# Patient Record
Sex: Male | Born: 1935 | Race: White | Hispanic: No | Marital: Married | Smoking: Former smoker
Health system: Southern US, Community
[De-identification: ages and names within clinical notes are randomized; demographics above are authoritative.]

## PROBLEM LIST (undated history)

## (undated) DIAGNOSIS — J449 Chronic obstructive pulmonary disease, unspecified: Secondary | ICD-10-CM

## (undated) DIAGNOSIS — I10 Essential (primary) hypertension: Secondary | ICD-10-CM

## (undated) DIAGNOSIS — I2510 Atherosclerotic heart disease of native coronary artery without angina pectoris: Secondary | ICD-10-CM

## (undated) DIAGNOSIS — I509 Heart failure, unspecified: Secondary | ICD-10-CM

## (undated) HISTORY — PX: CORONARY ARTERY BYPASS GRAFT: SHX141

---

## 2006-06-22 ENCOUNTER — Emergency Department: Payer: Self-pay | Admitting: Emergency Medicine

## 2007-02-11 ENCOUNTER — Inpatient Hospital Stay: Payer: Self-pay | Admitting: Unknown Physician Specialty

## 2007-02-11 ENCOUNTER — Other Ambulatory Visit: Payer: Self-pay

## 2007-03-16 ENCOUNTER — Ambulatory Visit: Payer: Self-pay | Admitting: Unknown Physician Specialty

## 2008-02-29 ENCOUNTER — Encounter: Payer: Self-pay | Admitting: Internal Medicine

## 2008-03-26 ENCOUNTER — Encounter: Payer: Self-pay | Admitting: Internal Medicine

## 2008-04-25 ENCOUNTER — Encounter: Payer: Self-pay | Admitting: Internal Medicine

## 2008-05-26 ENCOUNTER — Encounter: Payer: Self-pay | Admitting: Internal Medicine

## 2011-11-03 DIAGNOSIS — K279 Peptic ulcer, site unspecified, unspecified as acute or chronic, without hemorrhage or perforation: Secondary | ICD-10-CM | POA: Insufficient documentation

## 2011-11-03 DIAGNOSIS — Z87891 Personal history of nicotine dependence: Secondary | ICD-10-CM | POA: Insufficient documentation

## 2011-11-03 DIAGNOSIS — I2510 Atherosclerotic heart disease of native coronary artery without angina pectoris: Secondary | ICD-10-CM | POA: Insufficient documentation

## 2011-11-30 ENCOUNTER — Ambulatory Visit: Payer: Self-pay | Admitting: Urology

## 2011-12-23 ENCOUNTER — Ambulatory Visit: Payer: Self-pay | Admitting: Urology

## 2011-12-23 DIAGNOSIS — I2510 Atherosclerotic heart disease of native coronary artery without angina pectoris: Secondary | ICD-10-CM

## 2011-12-23 LAB — CBC WITH DIFFERENTIAL/PLATELET
Basophil #: 0 10*3/uL (ref 0.0–0.1)
Basophil %: 0.1 %
Eosinophil #: 0.1 10*3/uL (ref 0.0–0.7)
HGB: 15.3 g/dL (ref 13.0–18.0)
Lymphocyte %: 13.9 %
MCH: 30.9 pg (ref 26.0–34.0)
MCHC: 33.9 g/dL (ref 32.0–36.0)
Monocyte #: 0.9 10*3/uL — ABNORMAL HIGH (ref 0.0–0.7)
Monocyte %: 10.7 %
Neutrophil #: 6.5 10*3/uL (ref 1.4–6.5)
Neutrophil %: 73.6 %
RDW: 13 % (ref 11.5–14.5)

## 2011-12-23 LAB — BASIC METABOLIC PANEL
Anion Gap: 5 — ABNORMAL LOW (ref 7–16)
BUN: 19 mg/dL — ABNORMAL HIGH (ref 7–18)
EGFR (African American): >60
EGFR (Non-African Amer.): >60
Glucose: 115 mg/dL — ABNORMAL HIGH (ref 65–99)
Osmolality: 281 (ref 275–301)
Potassium: 4.1 mmol/L (ref 3.5–5.1)

## 2011-12-24 LAB — URINE CULTURE

## 2011-12-30 ENCOUNTER — Ambulatory Visit: Payer: Self-pay | Admitting: Urology

## 2012-01-01 LAB — PATHOLOGY REPORT

## 2012-07-08 DIAGNOSIS — R300 Dysuria: Secondary | ICD-10-CM | POA: Insufficient documentation

## 2012-08-08 DIAGNOSIS — N400 Enlarged prostate without lower urinary tract symptoms: Secondary | ICD-10-CM | POA: Insufficient documentation

## 2012-12-19 ENCOUNTER — Ambulatory Visit: Payer: Self-pay | Admitting: Unknown Physician Specialty

## 2014-02-27 ENCOUNTER — Emergency Department: Payer: Self-pay | Admitting: Emergency Medicine

## 2014-02-27 DIAGNOSIS — G4700 Insomnia, unspecified: Secondary | ICD-10-CM | POA: Insufficient documentation

## 2014-02-27 DIAGNOSIS — K219 Gastro-esophageal reflux disease without esophagitis: Secondary | ICD-10-CM | POA: Insufficient documentation

## 2014-02-27 DIAGNOSIS — Z951 Presence of aortocoronary bypass graft: Secondary | ICD-10-CM | POA: Insufficient documentation

## 2014-02-27 DIAGNOSIS — J45909 Unspecified asthma, uncomplicated: Secondary | ICD-10-CM | POA: Insufficient documentation

## 2014-02-27 DIAGNOSIS — K2270 Barrett's esophagus without dysplasia: Secondary | ICD-10-CM | POA: Insufficient documentation

## 2014-02-27 DIAGNOSIS — S32409A Unspecified fracture of unspecified acetabulum, initial encounter for closed fracture: Secondary | ICD-10-CM | POA: Insufficient documentation

## 2014-02-27 DIAGNOSIS — G2581 Restless legs syndrome: Secondary | ICD-10-CM | POA: Insufficient documentation

## 2014-02-27 LAB — CBC
HCT: 41.6 % (ref 40.0–52.0)
HGB: 14.1 g/dL (ref 13.0–18.0)
MCH: 30.3 pg (ref 26.0–34.0)
MCHC: 33.9 g/dL (ref 32.0–36.0)
MCV: 89 fL (ref 80–100)
Platelet: 160 10*3/uL (ref 150–440)
RBC: 4.65 10*6/uL (ref 4.40–5.90)
RDW: 13.2 % (ref 11.5–14.5)
WBC: 14.2 10*3/uL — ABNORMAL HIGH (ref 3.8–10.6)

## 2014-02-27 LAB — COMPREHENSIVE METABOLIC PANEL
ALBUMIN: 4 g/dL (ref 3.4–5.0)
ALK PHOS: 68 U/L
AST: 28 U/L (ref 15–37)
Anion Gap: 6 — ABNORMAL LOW (ref 7–16)
BILIRUBIN TOTAL: 0.5 mg/dL (ref 0.2–1.0)
BUN: 23 mg/dL — ABNORMAL HIGH (ref 7–18)
CHLORIDE: 108 mmol/L — AB (ref 98–107)
Calcium, Total: 8.9 mg/dL (ref 8.5–10.1)
Co2: 25 mmol/L (ref 21–32)
Creatinine: 1.15 mg/dL (ref 0.60–1.30)
EGFR (African American): >60
EGFR (Non-African Amer.): >60
Glucose: 142 mg/dL — ABNORMAL HIGH (ref 65–99)
Osmolality: 284 (ref 275–301)
Potassium: 4.1 mmol/L (ref 3.5–5.1)
SGPT (ALT): 24 U/L (ref 12–78)
Sodium: 139 mmol/L (ref 136–145)
Total Protein: 7 g/dL (ref 6.4–8.2)

## 2014-02-27 LAB — APTT: ACTIVATED PTT: 31.1 s (ref 23.6–35.9)

## 2014-02-27 LAB — PROTIME-INR
INR: 1.2
Prothrombin Time: 14.6 secs (ref 11.5–14.7)

## 2014-03-02 ENCOUNTER — Encounter: Payer: Self-pay | Admitting: Internal Medicine

## 2014-03-26 ENCOUNTER — Encounter: Payer: Self-pay | Admitting: Internal Medicine

## 2015-02-17 NOTE — Op Note (Signed)
PATIENT NAME:  Jerry Jimenez, CHEUVRONT MR#:  [id number] DATE OF BIRTH:  24-Feb-1934  DATE OF PROCEDURE:  12/30/2011  PREOPERATIVE DIAGNOSES:  1. Microhematuria.  2. Bladder lesion.   POSTOPERATIVE DIAGNOSIS:  1. Microhematuria. 2. Bladder lesion.   PROCEDURES:  1. Transurethral resection of bladder tumor.  2. Bladder biopsies.   SURGEON: John Giovanni, M.D.   ASSISTANT: None.   ANESTHESIA: General.   INDICATIONS: 81 year old male with a history of irritative voiding symptoms and microhematuria. 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 Cystoscopy demonstrated bullous edema and erythema within the diverticulum mucosa. There was abnormal-appearing tissue surrounding the mouth of the diverticulum. He presents for resection/biopsy.   DESCRIPTION OF PROCEDURE: The patient was taken to the operating room where a general anesthetic was administered. He was placed in the low lithotomy position and his external genitalia were prepped and draped sterilely. A 21 French cystoscope with 30-degrees lens was lubricated and passed under direct vision. The urethra was normal in caliber without stricture. There was moderate lateral lobe enlargement and a large median lobe present. Bladder mucosa was closely inspected and there was 3+ trabeculation with cellule formation. On the right posterior lateral wall was a wide-mouth diverticulum which was easily scoped and shows areas of bullous edema. Mucosa surrounding the mouth of the diverticulum was slightly hemorrhagic with whitish-appearing areas. The bullous edema areas just inside the diverticulum were biopsied with cold cup forceps. No perforation was noted. The site was fulgurated with a Bugbee electrode. The cystoscope was removed and a continuous flow resectoscope sheath with visual obturator was lubricated and passed per urethra. The Iglesias resectoscope was then placed in the sheath and using a saline  generator, the hemorrhagic areas were resected to superficial muscle. Sites were fulgurated. No significant bleeding was noted. All specimen was removed via irrigation. The resectoscope was removed. A 16 French Foley catheter was placed with return of clear effluent upon irrigation. The patient was taken to the PAC-U in stable condition. There were no complications. Estimated blood loss was minimal.      ____________________________ Ronda Fairly. Bernardo Heater, MD scs:bjt D: 12/30/2011 13:26:46 ET T: 12/30/2011 13:47:47 ET JOB#: [id number]  cc: Nicki Reaper C. Bernardo Heater, MD, <Dictator> Abbie Sons MD ELECTRONICALLY SIGNED 01/04/2012 18:16

## 2015-06-05 ENCOUNTER — Ambulatory Visit: Payer: Medicare Other | Attending: Specialist

## 2015-06-05 DIAGNOSIS — G4761 Periodic limb movement disorder: Secondary | ICD-10-CM | POA: Diagnosis not present

## 2015-06-05 DIAGNOSIS — R0683 Snoring: Secondary | ICD-10-CM | POA: Insufficient documentation

## 2015-06-05 DIAGNOSIS — I10 Essential (primary) hypertension: Secondary | ICD-10-CM | POA: Insufficient documentation

## 2015-06-05 DIAGNOSIS — E669 Obesity, unspecified: Secondary | ICD-10-CM | POA: Insufficient documentation

## 2015-06-05 DIAGNOSIS — G4733 Obstructive sleep apnea (adult) (pediatric): Secondary | ICD-10-CM | POA: Insufficient documentation

## 2016-08-06 DIAGNOSIS — J449 Chronic obstructive pulmonary disease, unspecified: Secondary | ICD-10-CM | POA: Insufficient documentation

## 2016-08-06 DIAGNOSIS — E782 Mixed hyperlipidemia: Secondary | ICD-10-CM | POA: Insufficient documentation

## 2016-08-06 DIAGNOSIS — E119 Type 2 diabetes mellitus without complications: Secondary | ICD-10-CM | POA: Insufficient documentation

## 2016-08-06 DIAGNOSIS — I10 Essential (primary) hypertension: Secondary | ICD-10-CM | POA: Insufficient documentation

## 2016-11-13 DIAGNOSIS — E538 Deficiency of other specified B group vitamins: Secondary | ICD-10-CM | POA: Insufficient documentation

## 2016-11-30 DIAGNOSIS — I482 Chronic atrial fibrillation, unspecified: Secondary | ICD-10-CM | POA: Insufficient documentation

## 2016-11-30 DIAGNOSIS — I350 Nonrheumatic aortic (valve) stenosis: Secondary | ICD-10-CM | POA: Insufficient documentation

## 2017-01-23 ENCOUNTER — Emergency Department: Payer: Medicare Other

## 2017-01-23 ENCOUNTER — Emergency Department
Admission: EM | Admit: 2017-01-23 | Discharge: 2017-01-23 | Disposition: A | Discharge status: Home / Self Care | Payer: Medicare Other | Attending: Emergency Medicine | Admitting: Emergency Medicine

## 2017-01-23 DIAGNOSIS — W208XXA Other cause of strike by thrown, projected or falling object, initial encounter: Secondary | ICD-10-CM | POA: Diagnosis not present

## 2017-01-23 DIAGNOSIS — Y999 Unspecified external cause status: Secondary | ICD-10-CM | POA: Insufficient documentation

## 2017-01-23 DIAGNOSIS — Y929 Unspecified place or not applicable: Secondary | ICD-10-CM | POA: Insufficient documentation

## 2017-01-23 DIAGNOSIS — Z23 Encounter for immunization: Secondary | ICD-10-CM | POA: Diagnosis not present

## 2017-01-23 DIAGNOSIS — S60413A Abrasion of left middle finger, initial encounter: Secondary | ICD-10-CM | POA: Diagnosis present

## 2017-01-23 DIAGNOSIS — S60419A Abrasion of unspecified finger, initial encounter: Secondary | ICD-10-CM

## 2017-01-23 DIAGNOSIS — Y939 Activity, unspecified: Secondary | ICD-10-CM | POA: Diagnosis not present

## 2017-01-23 MED ORDER — LIDOCAINE-EPINEPHRINE-TETRACAINE (LET) SOLUTION
NASAL | Status: AC
  Filled 2017-01-23: qty 3

## 2017-01-23 MED ORDER — TETANUS-DIPHTH-ACELL PERTUSSIS 5-2.5-18.5 LF-MCG/0.5 IM SUSP
0.5000 mL | Freq: Once | INTRAMUSCULAR | Status: AC
  Administered 2017-01-23: 0.5 mL via INTRAMUSCULAR
  Filled 2017-01-23: qty 0.5

## 2017-01-23 MED ORDER — CEPHALEXIN 500 MG PO CAPS: 500.0000 mg | ORAL_CAPSULE | Freq: Four times a day (QID) | ORAL | 0 refills | Status: AC

## 2017-01-23 NOTE — ED Provider Notes (Signed)
[facility] Emergency Department Provider Note  ____________________________________________  Time seen: Approximately 5:06 PM  I have reviewed the triage vital signs and the nursing notes.   HISTORY  Chief Complaint Laceration    HPI Jerry Jimenez is a 83 y.o. male that presents to the emergency department with left middle finger abrasion that continues to bleed. He states that a "utility door" dropped on his finger about 4 hours ago. He states that he thought he could stop the bleeding but it has continued to bleed. Patient denies any additional injuries. He does not remember last tetanus shot. Patient is on blood thinners. No shortness of breath, chest pain, nausea, vomiting, abdominal pain, numbness, tingling.   No past medical history on file.  There are no active problems to display for this patient.   No past surgical history on file.  Prior to Admission medications   Medication Sig Start Date End Date Taking? Authorizing Provider  cephALEXin (KEFLEX) 500 MG capsule Take 1 capsule (500 mg total) by mouth 4 (four) times daily. 01/23/17 02/02/17  Laban Emperor, PA-C    Allergies Patient has no allergy information on record.  No family history on file.  Social History Social History  Substance Use Topics  . Smoking status: Not on file  . Smokeless tobacco: Not on file  . Alcohol use Not on file     Review of Systems  Constitutional: No fever/chills Cardiovascular: No chest pain. Respiratory: No SOB. Gastrointestinal: No abdominal pain.  No nausea, no vomiting.  Musculoskeletal: Positive for finger pain. Skin: Negative for rash, ecchymosis. Positive for laceration. Neurological: Negative for headaches, numbness or tingling   ____________________________________________   PHYSICAL EXAM:  VITAL SIGNS: ED Triage Vitals [01/23/17 1659]  Enc Vitals Group     BP (!) 170/102     Pulse Rate 78     Resp 18     Temp 98.2 F (36.8 C)      Temp Source Oral     SpO2 94 %     Weight 272 lb (123.4 kg)     Height '5\' 11"'$  (1.803 m)     Head Circumference      Peak Flow      Pain Score      Pain Loc      Pain Edu?      Excl. in [place]?      Constitutional: Alert and oriented. Well appearing and in no acute distress. Eyes: Conjunctivae are normal. PERRL. EOMI. Head: Atraumatic. ENT:      Ears:      Nose: No congestion/rhinnorhea.      Mouth/Throat: Mucous membranes are moist.  Neck: No stridor. Cardiovascular: Normal rate, regular rhythm.  Good peripheral circulation. Respiratory: Normal respiratory effort without tachypnea or retractions. Lungs CTAB. Good air entry to the bases with no decreased or absent breath sounds. Musculoskeletal: Full range of motion to all extremities. No gross deformities appreciated. Neurologic:  Normal speech and language. No gross focal neurologic deficits are appreciated.  Skin:  Skin is warm, dry and intact. 1 cm S-shaped shallow but wide abrasion to right middle finger.   ____________________________________________   LABS (all labs ordered are listed, but only abnormal results are displayed)  Labs Reviewed - No data to display ____________________________________________  EKG   ____________________________________________  RADIOLOGY  Dg Finger Middle Left  Result Date: 01/23/2017 CLINICAL DATA:  Fall. EXAM: LEFT MIDDLE FINGER 2+V COMPARISON:  06/22/2016 FINDINGS: There is no evidence of fracture or dislocation. There  is no evidence of arthropathy or other focal bone abnormality. Soft tissues are unremarkable. IMPRESSION: Negative. Electronically Signed   By: Kerby Moors M.D.   On: 01/23/2017 17:42    ____________________________________________    PROCEDURES  Procedure(s) performed:    Procedures    Medications  lidocaine-EPINEPHrine-tetracaine (LET) solution (not administered)  Tdap (BOOSTRIX) injection 0.5 mL (0.5 mLs Intramuscular Given 01/23/17 1709)      ____________________________________________   INITIAL IMPRESSION / ASSESSMENT AND PLAN / ED COURSE  Pertinent labs & imaging results that were available during my care of the patient were reviewed by me and considered in my medical decision making (see chart for details).  Review of the [place] CSRS was performed in accordance of the [place] prior to dispensing any controlled drugs.     Patient's diagnosis is consistent with finger abrasion. Vital signs and exam are reassuring. X-ray negative for acute bony abnormalities. Abrasion is shallow but wide. There is no area to suture. Abrasion is too wide for Dermabond. LET was applied to abrasion to control the bleeding and abrasion stopped bleeding. Pressure bandage was applied and splint was placed to prevent further injury. Tetanus shot was updated. Patient will be discharged home with prescriptions for Keflex. Patient is to follow up with PCP as directed. Patient is given ED precautions to return to the ED for any worsening or new symptoms.     ____________________________________________  FINAL CLINICAL IMPRESSION(S) / ED DIAGNOSES  Final diagnoses:  Abrasion of finger, initial encounter      NEW MEDICATIONS STARTED DURING THIS VISIT:  Discharge Medication List as of 01/23/2017  6:12 PM    START taking these medications   Details  cephALEXin (KEFLEX) 500 MG capsule Take 1 capsule (500 mg total) by mouth 4 (four) times daily., Starting Sat 01/23/2017, Until Tue 02/02/2017, Print            This chart was dictated using voice recognition software/Dragon. Despite best efforts to proofread, errors can occur which can change the meaning. Any change was purely unintentional.    Laban Emperor, PA-C 01/23/17 [place], PA-C 01/23/17 [place], PA-C 01/23/17 [place] Mal[place], MD 01/24/17 [phone]

## 2017-01-23 NOTE — ED Triage Notes (Signed)
Patient had a "drop-down door" fall onto his finger lacerating his left third

## 2017-07-05 DIAGNOSIS — E669 Obesity, unspecified: Secondary | ICD-10-CM | POA: Insufficient documentation

## 2017-07-20 ENCOUNTER — Encounter: Payer: Medicare Other | Attending: Pulmonary Disease

## 2017-07-20 VITALS — Ht 70.0 in | Wt 221.4 lb

## 2017-07-20 DIAGNOSIS — Z87891 Personal history of nicotine dependence: Secondary | ICD-10-CM | POA: Diagnosis not present

## 2017-07-20 DIAGNOSIS — J449 Chronic obstructive pulmonary disease, unspecified: Secondary | ICD-10-CM | POA: Insufficient documentation

## 2017-07-20 NOTE — Progress Notes (Signed)
Pulmonary Individual Treatment Plan  Patient Details  Name: Jerry Jimenez MRN: [id number] Date of Birth: [birth year] Referring Provider:     Pulmonary Rehab from 07/20/2017 in [facility] Cardiac and Pulmonary Rehab  Referring Provider  Pugh      Initial Encounter Date:    Pulmonary Rehab from 07/20/2017 in [facility] Cardiac and Pulmonary Rehab  Date  07/20/17  Referring Provider  Pugh      Visit Diagnosis: Chronic obstructive pulmonary disease, unspecified COPD type ([place])  Patient's Home Medications on Admission: No current outpatient prescriptions on file.  Past Medical History: No past medical history on file.  Tobacco Use: History  Smoking Status  . Former Smoker  . Packs/day: 3.50  . Years: 35.00  . Types: Cigarettes  . Quit date: 07/06/1987  Smokeless Tobacco  . Former Systems developer  . Types: Chew  . Quit date: 07/05/2002    Labs: Recent Review Flowsheet Data    There is no flowsheet data to display.       Pulmonary Assessment Scores:     Pulmonary Assessment Scores    Row Name 07/20/17 1130         ADL UCSD   ADL Phase Entry     SOB Score total 58     Rest 3     Walk 3     Stairs 3     Bath 1     Dress 2     Shop 3       CAT Score   CAT Score 21        Pulmonary Function Assessment:     Pulmonary Function Assessment - 07/20/17 1151      Initial Spirometry Results   FVC% 88 %   FEV1% 85 %   FEV1/FVC Ratio 65.72     Post Bronchodilator Spirometry Results   FVC% 87.9 %   FEV1% 83.3 %   FEV1/FVC Ratio 67.71     Breath   Bilateral Breath Sounds Clear   Shortness of Breath Yes;Limiting activity      Exercise Target Goals: Date: 07/20/17  Exercise Program Goal: Individual exercise prescription set with THRR, safety & activity barriers. Participant demonstrates ability to understand and report RPE using BORG scale, to self-measure pulse accurately, and to acknowledge the importance of the exercise prescription.  Exercise Prescription  Goal: Starting with aerobic activity 30 plus minutes a day, 3 days per week for initial exercise prescription. Provide home exercise prescription and guidelines that participant acknowledges understanding prior to discharge.  Activity Barriers & Risk Stratification:   6 Minute Walk:     6 Minute Walk    Row Name 07/20/17 1237         6 Minute Walk   Distance 1280 feet     Walk Time 6 minutes     # of Rest Breaks 0     MPH 2.42     METS 2.48     RPE 12     Perceived Dyspnea  2     VO2 Peak 8.7     Symptoms No     Resting HR 75 bpm     Resting BP 146/80     Resting Oxygen Saturation  96 %     Exercise Oxygen Saturation  during 6 min walk 88 %     Max Ex. HR 111 bpm     Max Ex. BP 172/74     2 Minute Post BP 160/72       Interval HR  1 Minute HR 75     2 Minute HR 100     3 Minute HR 98     4 Minute HR 110     5 Minute HR 110     6 Minute HR 111     Interval Heart Rate? Yes       Interval Oxygen   Interval Oxygen? Yes     Baseline Oxygen Saturation % 96 %     1 Minute Oxygen Saturation % 93 %     1 Minute Liters of Oxygen 0 L     2 Minute Oxygen Saturation % 95 %     2 Minute Liters of Oxygen 0 L     3 Minute Oxygen Saturation % 94 %     3 Minute Liters of Oxygen 0 L     4 Minute Oxygen Saturation % 91 %     4 Minute Liters of Oxygen 0 L     5 Minute Oxygen Saturation % 91 %     5 Minute Liters of Oxygen 0 L     6 Minute Oxygen Saturation % 88 %     6 Minute Liters of Oxygen 0 L     2 Minute Post Oxygen Saturation % 95 %     2 Minute Post Liters of Oxygen 0 L       Oxygen Initial Assessment:     Oxygen Initial Assessment - 07/20/17 1140      Home Oxygen   Home Oxygen Device None   Sleep Oxygen Prescription None   Home Exercise Oxygen Prescription None   Home at Rest Exercise Oxygen Prescription None     Initial 6 min Walk   Oxygen Used None     Program Oxygen Prescription   Program Oxygen Prescription None     Intervention   Short Term Goals  To learn and understand importance of maintaining oxygen saturations>88%;To learn and demonstrate proper use of respiratory medications;To learn and demonstrate proper pursed lip breathing techniques or other breathing techniques.;To learn and understand importance of monitoring SPO2 with pulse oximeter and demonstrate accurate use of the pulse oximeter.   Long  Term Goals Verbalizes importance of monitoring SPO2 with pulse oximeter and return demonstration;Exhibits proper breathing techniques, such as pursed lip breathing or other method taught during program session;Demonstrates proper use of MDI's;Compliance with respiratory medication;Maintenance of O2 saturations>88%      Oxygen Re-Evaluation:   Oxygen Discharge (Final Oxygen Re-Evaluation):   Initial Exercise Prescription:     Initial Exercise Prescription - 07/20/17 1200      Date of Initial Exercise RX and Referring Provider   Date 07/20/17   Referring Provider Pugh     Treadmill   MPH 2   Grade 0   Minutes 15   METs 2.53     Recumbant Bike   Level 2   RPM 60   Watts 15   Minutes 15     NuStep   Level 2   SPM 80   Minutes 15   METs 2.4     Biostep-RELP   Level 2   SPM 50   Minutes 15   METs 2.4     Prescription Details   Frequency (times per week) 3   Duration Progress to 45 minutes of aerobic exercise without signs/symptoms of physical distress     Intensity   THRR 40-80% of Max Heartrate 100-126   Ratings of Perceived Exertion 11-15   Perceived Dyspnea 0-4  Resistance Training   Training Prescription Yes   Weight 3 lb   Reps 10-15      Perform Capillary Blood Glucose checks as needed.  Exercise Prescription Changes:   Exercise Comments:   Exercise Goals and Review:      Exercise Goals    Row Name 07/20/17 1237             Exercise Goals   Increase Physical Activity Yes       Intervention Provide advice, education, support and counseling about physical activity/exercise  needs.;Develop an individualized exercise prescription for aerobic and resistive training based on initial evaluation findings, risk stratification, comorbidities and participant's personal goals.       Expected Outcomes Achievement of increased cardiorespiratory fitness and enhanced flexibility, muscular endurance and strength shown through measurements of functional capacity and personal statement of participant.       Increase Strength and Stamina Yes       Intervention Provide advice, education, support and counseling about physical activity/exercise needs.;Develop an individualized exercise prescription for aerobic and resistive training based on initial evaluation findings, risk stratification, comorbidities and participant's personal goals.       Expected Outcomes Achievement of increased cardiorespiratory fitness and enhanced flexibility, muscular endurance and strength shown through measurements of functional capacity and personal statement of participant.       Able to understand and use rate of perceived exertion (RPE) scale Yes       Intervention Provide education and explanation on how to use RPE scale       Expected Outcomes Short Term: Able to use RPE daily in rehab to express subjective intensity level;Long Term:  Able to use RPE to guide intensity level when exercising independently       Able to understand and use Dyspnea scale Yes       Intervention Provide education and explanation on how to use Dyspnea scale       Expected Outcomes Short Term: Able to use Dyspnea scale daily in rehab to express subjective sense of shortness of breath during exertion;Long Term: Able to use Dyspnea scale to guide intensity level when exercising independently       Knowledge and understanding of Target Heart Rate Range (THRR) Yes       Intervention Provide education and explanation of THRR including how the numbers were predicted and where they are located for reference       Expected Outcomes Short  Term: Able to state/look up THRR;Long Term: Able to use THRR to govern intensity when exercising independently;Short Term: Able to use daily as guideline for intensity in rehab       Able to check pulse independently Yes       Intervention Provide education and demonstration on how to check pulse in carotid and radial arteries.;Review the importance of being able to check your own pulse for safety during independent exercise       Expected Outcomes Short Term: Able to explain why pulse checking is important during independent exercise;Long Term: Able to check pulse independently and accurately       Understanding of Exercise Prescription Yes       Intervention Provide education, explanation, and written materials on patient's individual exercise prescription       Expected Outcomes Short Term: Able to explain program exercise prescription;Long Term: Able to explain home exercise prescription to exercise independently          Exercise Goals Re-Evaluation :   Discharge Exercise Prescription (Final Exercise  Prescription Changes):   Nutrition:  Target Goals: Understanding of nutrition guidelines, daily intake of sodium 1500mg , cholesterol 200mg , calories 30% from fat and 7% or less from saturated fats, daily to have 5 or more servings of fruits and vegetables.  Biometrics:     Pre Biometrics - 07/20/17 1236      Pre Biometrics   Height 5\' 10"  (1.778 m)   Weight 221 lb 6.4 oz (100.4 kg)   Waist Circumference 42.5 inches   Hip Circumference 44 inches   Waist to Hip Ratio 0.97 %   BMI (Calculated) 31.77       Nutrition Therapy Plan and Nutrition Goals:     Nutrition Therapy & Goals - 07/20/17 1130      Personal Nutrition Goals   Comments patient needs to fill handouts for the dietician. paperwork given to patient.     Intervention Plan   Intervention Prescribe, educate and counsel regarding individualized specific dietary modifications aiming towards targeted core components  such as weight, hypertension, lipid management, diabetes, heart failure and other comorbidities.;Nutrition handout(s) given to patient.   Expected Outcomes Short Term Goal: Understand basic principles of dietary content, such as calories, fat, sodium, cholesterol and nutrients.;Short Term Goal: A plan has been developed with personal nutrition goals set during dietitian appointment.;Long Term Goal: Adherence to prescribed nutrition plan.      Nutrition Discharge: Rate Your Plate Scores:   Nutrition Goals Re-Evaluation:   Nutrition Goals Discharge (Final Nutrition Goals Re-Evaluation):   Psychosocial: Target Goals: Acknowledge presence or absence of significant depression and/or stress, maximize coping skills, provide positive support system. Participant is able to verbalize types and ability to use techniques and skills needed for reducing stress and depression.   Initial Review & Psychosocial Screening:     Initial Psych Review & Screening - 07/20/17 1126      Initial Review   Current issues with Current Stress Concerns   Source of Stress Concerns Unable to perform yard/household activities   Comments turns wooden bowles and collects old clocks for hobbies     [place]? Yes   Comments patient has 2 sons and a daughter that live within 10 miles of him.     Barriers   Psychosocial barriers to participate in program The patient should benefit from training in stress management and relaxation.     Screening Interventions   Interventions Yes;Encouraged to exercise;Program counselor consult;To provide support and resources with identified psychosocial needs;Provide feedback about the scores to participant   Expected Outcomes Short Term goal: Utilizing psychosocial counselor, staff and physician to assist with identification of specific Stressors or current issues interfering with healing process. Setting desired goal for each stressor or current issue  identified.;Long Term Goal: Stressors or current issues are controlled or eliminated.;Short Term goal: Identification and review with participant of any Quality of Life or Depression concerns found by scoring the questionnaire.;Long Term goal: The participant improves quality of Life and PHQ9 Scores as seen by post scores and/or verbalization of changes      Quality of Life Scores:   PHQ-9: Recent Review Flowsheet Data    Depression screen [facility] 2/9 07/20/2017   Decreased Interest 1   Down, Depressed, Hopeless 0   PHQ - 2 Score 1   Altered sleeping 3   Tired, decreased energy 3   Change in appetite 3   Feeling bad or failure about yourself  0   Trouble concentrating 0   Moving slowly or fidgety/restless  0   Suicidal thoughts 0   PHQ-9 Score 10   Difficult doing work/chores Not difficult at all     Interpretation of Total Score  Total Score Depression Severity:  1-4 = Minimal depression, 5-9 = Mild depression, 10-14 = Moderate depression, 15-19 = Moderately severe depression, 20-27 = Severe depression   Psychosocial Evaluation and Intervention:   Psychosocial Re-Evaluation:   Psychosocial Discharge (Final Psychosocial Re-Evaluation):   Education: Education Goals: Education classes will be provided on a weekly basis, covering required topics. Participant will state understanding/return demonstration of topics presented.  Learning Barriers/Preferences:     Learning Barriers/Preferences - 07/20/17 1317      Learning Barriers/Preferences   Learning Barriers Reading   Learning Preferences Individual Instruction;Verbal Instruction;Video      Education Topics: Initial Evaluation Education: - Verbal, written and demonstration of respiratory meds, RPE/PD scales, oximetry and breathing techniques. Instruction on use of nebulizers and MDIs: cleaning and proper use, rinsing mouth with steroid doses and importance of monitoring MDI activations.   Pulmonary Rehab from 07/20/2017 in  [facility] Cardiac and Pulmonary Rehab  Date  07/20/17  Educator  [facility]  Instruction Review Code  1- Verbalizes Understanding      General Nutrition Guidelines/Fats and Fiber: -Group instruction provided by verbal, written material, models and posters to present the general guidelines for heart healthy nutrition. Gives an explanation and review of dietary fats and fiber.   Pulmonary Rehab from 07/20/2017 in [facility] Cardiac and Pulmonary Rehab  Date  07/20/17  Educator  [facility]  Instruction Review Code  1- Verbalizes Understanding      Controlling Sodium/Reading Food Labels: -Group verbal and written material supporting the discussion of sodium use in heart healthy nutrition. Review and explanation with models, verbal and written materials for utilization of the food label.   Exercise Physiology & Risk Factors: - Group verbal and written instruction with models to review the exercise physiology of the cardiovascular system and associated critical values. Details cardiovascular disease risk factors and the goals associated with each risk factor.   Aerobic Exercise & Resistance Training: - Gives group verbal and written discussion on the health impact of inactivity. On the components of aerobic and resistive training programs and the benefits of this training and how to safely progress through these programs.   Flexibility, Balance, General Exercise Guidelines: - Provides group verbal and written instruction on the benefits of flexibility and balance training programs. Provides general exercise guidelines with specific guidelines to those with heart or lung disease. Demonstration and skill practice provided.   Stress Management: - Provides group verbal and written instruction about the health risks of elevated stress, cause of high stress, and healthy ways to reduce stress.   Depression: - Provides group verbal and written instruction on the correlation between heart/lung disease and depressed mood,  treatment options, and the stigmas associated with seeking treatment.   Exercise & Equipment Safety: - Individual verbal instruction and demonstration of equipment use and safety with use of the equipment.   Infection Prevention: - Provides verbal and written material to individual with discussion of infection control including proper hand washing and proper equipment cleaning during exercise session.   Pulmonary Rehab from 07/20/2017 in [facility] Cardiac and Pulmonary Rehab  Date  07/20/17  Educator  [facility]  Instruction Review Code  1- Verbalizes Understanding      Falls Prevention: - Provides verbal and written material to individual with discussion of falls prevention and safety.   Pulmonary Rehab from 07/20/2017 in [facility]  Cardiac and Pulmonary Rehab  Date  07/20/17  Educator  [facility]  Instruction Review Code  1- Verbalizes Understanding      Diabetes: - Individual verbal and written instruction to review signs/symptoms of diabetes, desired ranges of glucose level fasting, after meals and with exercise. Advice that pre and post exercise glucose checks will be done for 3 sessions at entry of program.   Chronic Lung Diseases: - Group verbal and written instruction to review new updates, new respiratory medications, new advancements in procedures and treatments. Provide informative websites and "800" numbers of self-education.   Lung Procedures: - Group verbal and written instruction to describe testing methods done to diagnose lung disease. Review the outcome of test results. Describe the treatment choices: Pulmonary Function Tests, ABGs and oximetry.   Energy Conservation: - Provide group verbal and written instruction for methods to conserve energy, plan and organize activities. Instruct on pacing techniques, use of adaptive equipment and posture/positioning to relieve shortness of breath.   Triggers: - Group verbal and written instruction to review types of environmental controls: home  humidity, furnaces, filters, dust mite/pet prevention, HEPA vacuums. To discuss weather changes, air quality and the benefits of nasal washing.   Exacerbations: - Group verbal and written instruction to provide: warning signs, infection symptoms, calling MD promptly, preventive modes, and value of vaccinations. Review: effective airway clearance, coughing and/or vibration techniques. Create an Sports administrator.   Oxygen: - Individual and group verbal and written instruction on oxygen therapy. Includes supplement oxygen, available portable oxygen systems, continuous and intermittent flow rates, oxygen safety, concentrators, and Medicare reimbursement for oxygen.   Respiratory Medications: - Group verbal and written instruction to review medications for lung disease. Drug class, frequency, complications, importance of spacers, rinsing mouth after steroid MDI's, and proper cleaning methods for nebulizers.   AED/CPR: - Group verbal and written instruction with the use of models to demonstrate the basic use of the AED with the basic ABC's of resuscitation.   Breathing Retraining: - Provides individuals verbal and written instruction on purpose, frequency, and proper technique of diaphragmatic breathing and pursed-lipped breathing. Applies individual practice skills.   Pulmonary Rehab from 07/20/2017 in [facility] Cardiac and Pulmonary Rehab  Date  07/20/17  Educator  [facility]  Instruction Review Code  1- Verbalizes Understanding      Anatomy and Physiology of the Lungs: - Group verbal and written instruction with the use of models to provide basic lung anatomy and physiology related to function, structure and complications of lung disease.   Anatomy & Physiology of the Heart: - Group verbal and written instruction and models provide basic cardiac anatomy and physiology, with the coronary electrical and arterial systems. Review of: AMI, Angina, Valve disease, Heart Failure, Cardiac Arrhythmia, Pacemakers,  and the ICD.   Heart Failure: - Group verbal and written instruction on the basics of heart failure: signs/symptoms, treatments, explanation of ejection fraction, enlarged heart and cardiomyopathy.   Sleep Apnea: - Individual verbal and written instruction to review Obstructive Sleep Apnea. Review of risk factors, methods for diagnosing and types of masks and machines for OSA.   Anxiety: - Provides group, verbal and written instruction on the correlation between heart/lung disease and anxiety, treatment options, and management of anxiety.   Relaxation: - Provides group, verbal and written instruction about the benefits of relaxation for patients with heart/lung disease. Also provides patients with examples of relaxation techniques.   Cardiac Medications: - Group verbal and written instruction to review commonly prescribed medications for heart disease.  Reviews the medication, class of the drug, and side effects.   Know Your Numbers: -Group verbal and written instruction about important numbers in your health.  Review of Cholesterol, Blood Pressure, Diabetes, and BMI and the role they play in your overall health.   Other: -Provides group and verbal instruction on various topics (see comments)    Knowledge Questionnaire Score:     Knowledge Questionnaire Score - 07/20/17 1132      Knowledge Questionnaire Score   Pre Score 5/10       Core Components/Risk Factors/Patient Goals at Admission:     Personal Goals and Risk Factors at Admission - 07/20/17 1143      Core Components/Risk Factors/Patient Goals on Admission    Weight Management Yes   Intervention Weight Management: Develop a combined nutrition and exercise program designed to reach desired caloric intake, while maintaining appropriate intake of nutrient and fiber, sodium and fats, and appropriate energy expenditure required for the weight goal.;Weight Management: Provide education and appropriate resources to help  participant work on and attain dietary goals.;Weight Management/Obesity: Establish reasonable short term and long term weight goals.   Admit Weight 222 lb (100.7 kg)   Goal Weight: Short Term 217 lb (98.4 kg)   Goal Weight: Long Term 175 lb (79.4 kg)   Expected Outcomes Short Term: Continue to assess and modify interventions until short term weight is achieved;Weight Maintenance: Understanding of the daily nutrition guidelines, which includes 25-35% calories from fat, 7% or less cal from saturated fats, less than 200mg  cholesterol, less than 1.5gm of sodium, & 5 or more servings of fruits and vegetables daily;Long Term: Adherence to nutrition and physical activity/exercise program aimed toward attainment of established weight goal;Weight Loss: Understanding of general recommendations for a balanced deficit meal plan, which promotes 1-2 lb weight loss per week and includes a negative energy balance of [phone] kcal/d;Understanding recommendations for meals to include 15-35% energy as protein, 25-35% energy from fat, 35-60% energy from carbohydrates, less than 200mg  of dietary cholesterol, 20-35 gm of total fiber daily;Understanding of distribution of calorie intake throughout the day with the consumption of 4-5 meals/snacks   Hypertension Yes   Intervention Provide education on lifestyle modifcations including regular physical activity/exercise, weight management, moderate sodium restriction and increased consumption of fresh fruit, vegetables, and low fat dairy, alcohol moderation, and smoking cessation.;Monitor prescription use compliance.   Expected Outcomes Short Term: Continued assessment and intervention until BP is < 140/27mm HG in hypertensive participants. < 130/31mm HG in hypertensive participants with diabetes, heart failure or chronic kidney disease.;Long Term: Maintenance of blood pressure at goal levels.   Lipids Yes   Intervention Provide education and support for participant on nutrition &  aerobic/resistive exercise along with prescribed medications to achieve LDL 70mg , HDL >40mg .   Expected Outcomes Short Term: Participant states understanding of desired cholesterol values and is compliant with medications prescribed. Participant is following exercise prescription and nutrition guidelines.;Long Term: Cholesterol controlled with medications as prescribed, with individualized exercise RX and with personalized nutrition plan. Value goals: LDL < 70mg , HDL > 40 mg.   Stress Yes   Intervention Offer individual and/or small group education and counseling on adjustment to heart disease, stress management and health-related lifestyle change. Teach and support self-help strategies.;Refer participants experiencing significant psychosocial distress to appropriate mental health specialists for further evaluation and treatment. When possible, include family members and significant others in education/counseling sessions.   Expected Outcomes Short Term: Participant demonstrates changes in health-related behavior, relaxation and other stress management  skills, ability to obtain effective social support, and compliance with psychotropic medications if prescribed.;Long Term: Emotional wellbeing is indicated by absence of clinically significant psychosocial distress or social isolation.      Core Components/Risk Factors/Patient Goals Review:    Core Components/Risk Factors/Patient Goals at Discharge (Final Review):    ITP Comments:     ITP Comments    Row Name 07/20/17 1309           ITP Comments Medical evaluation completed. Chart sent to Dr. Emily Filbert director of [facility] for signature and review. Diagnosis can be found in [facility] encounter 07/20/17          Comments: Initial ITP

## 2017-07-20 NOTE — Patient Instructions (Signed)
Patient Instructions  Patient Details  Name: Jerry Jimenez MRN: [id number] Date of Birth: June 15, 1934 Referring Provider:  Harl Favor*  Below are the personal goals you chose as well as exercise and nutrition goals. Our goal is to help you keep on track towards obtaining and maintaining your goals. We will be discussing your progress on these goals with you throughout the program.  Initial Exercise Prescription:     Initial Exercise Prescription - 07/20/17 1200      Date of Initial Exercise RX and Referring Provider   Date 07/20/17   Referring Provider Pugh     Treadmill   MPH 2   Grade 0   Minutes 15   METs 2.53     Recumbant Bike   Level 2   RPM 60   Watts 15   Minutes 15     NuStep   Level 2   SPM 80   Minutes 15   METs 2.4     Biostep-RELP   Level 2   SPM 50   Minutes 15   METs 2.4     Prescription Details   Frequency (times per week) 3   Duration Progress to 45 minutes of aerobic exercise without signs/symptoms of physical distress     Intensity   THRR 40-80% of Max Heartrate 100-126   Ratings of Perceived Exertion 11-15   Perceived Dyspnea 0-4     Resistance Training   Training Prescription Yes   Weight 3 lb   Reps 10-15      Exercise Goals: Frequency: Be able to perform aerobic exercise three times per week working toward 3-5 days per week.  Intensity: Work with a perceived exertion of 11 (fairly light) - 15 (hard) as tolerated. Follow your new exercise prescription and watch for changes in prescription as you progress with the program. Changes will be reviewed with you when they are made.  Duration: You should be able to do 30 minutes of continuous aerobic exercise in addition to a 5 minute warm-up and a 5 minute cool-down routine.  Nutrition Goals: Your personal nutrition goals will be established when you do your nutrition analysis with the dietician.  The following are nutrition guidelines to follow: Cholesterol <  200mg /day Sodium < 1500mg /day Fiber: Men over 50 yrs - 30 grams per day  Personal Goals:     Personal Goals and Risk Factors at Admission - 07/20/17 1143      Core Components/Risk Factors/Patient Goals on Admission    Weight Management Yes   Intervention Weight Management: Develop a combined nutrition and exercise program designed to reach desired caloric intake, while maintaining appropriate intake of nutrient and fiber, sodium and fats, and appropriate energy expenditure required for the weight goal.;Weight Management: Provide education and appropriate resources to help participant work on and attain dietary goals.;Weight Management/Obesity: Establish reasonable short term and long term weight goals.   Admit Weight 222 lb (100.7 kg)   Goal Weight: Short Term 217 lb (98.4 kg)   Goal Weight: Long Term 175 lb (79.4 kg)   Expected Outcomes Short Term: Continue to assess and modify interventions until short term weight is achieved;Weight Maintenance: Understanding of the daily nutrition guidelines, which includes 25-35% calories from fat, 7% or less cal from saturated fats, less than 200mg  cholesterol, less than 1.5gm of sodium, & 5 or more servings of fruits and vegetables daily;Long Term: Adherence to nutrition and physical activity/exercise program aimed toward attainment of established weight goal;Weight Loss: Understanding of general recommendations  for a balanced deficit meal plan, which promotes 1-2 lb weight loss per week and includes a negative energy balance of [phone] kcal/d;Understanding recommendations for meals to include 15-35% energy as protein, 25-35% energy from fat, 35-60% energy from carbohydrates, less than 200mg  of dietary cholesterol, 20-35 gm of total fiber daily;Understanding of distribution of calorie intake throughout the day with the consumption of 4-5 meals/snacks   Hypertension Yes   Intervention Provide education on lifestyle modifcations including regular physical  activity/exercise, weight management, moderate sodium restriction and increased consumption of fresh fruit, vegetables, and low fat dairy, alcohol moderation, and smoking cessation.;Monitor prescription use compliance.   Expected Outcomes Short Term: Continued assessment and intervention until BP is < 140/42mm HG in hypertensive participants. < 130/6mm HG in hypertensive participants with diabetes, heart failure or chronic kidney disease.;Long Term: Maintenance of blood pressure at goal levels.   Lipids Yes   Intervention Provide education and support for participant on nutrition & aerobic/resistive exercise along with prescribed medications to achieve LDL 70mg , HDL >40mg .   Expected Outcomes Short Term: Participant states understanding of desired cholesterol values and is compliant with medications prescribed. Participant is following exercise prescription and nutrition guidelines.;Long Term: Cholesterol controlled with medications as prescribed, with individualized exercise RX and with personalized nutrition plan. Value goals: LDL < 70mg , HDL > 40 mg.   Stress Yes   Intervention Offer individual and/or small group education and counseling on adjustment to heart disease, stress management and health-related lifestyle change. Teach and support self-help strategies.;Refer participants experiencing significant psychosocial distress to appropriate mental health specialists for further evaluation and treatment. When possible, include family members and significant others in education/counseling sessions.   Expected Outcomes Short Term: Participant demonstrates changes in health-related behavior, relaxation and other stress management skills, ability to obtain effective social support, and compliance with psychotropic medications if prescribed.;Long Term: Emotional wellbeing is indicated by absence of clinically significant psychosocial distress or social isolation.      Tobacco Use Initial  Evaluation: History  Smoking Status  . Former Smoker  . Packs/day: 3.50  . Years: 35.00  . Types: Cigarettes  . Quit date: 07/06/1987  Smokeless Tobacco  . Former Systems developer  . Types: Chew  . Quit date: 07/05/2002    Exercise Goals and Review:     Exercise Goals    Row Name 07/20/17 1237             Exercise Goals   Increase Physical Activity Yes       Intervention Provide advice, education, support and counseling about physical activity/exercise needs.;Develop an individualized exercise prescription for aerobic and resistive training based on initial evaluation findings, risk stratification, comorbidities and participant's personal goals.       Expected Outcomes Achievement of increased cardiorespiratory fitness and enhanced flexibility, muscular endurance and strength shown through measurements of functional capacity and personal statement of participant.       Increase Strength and Stamina Yes       Intervention Provide advice, education, support and counseling about physical activity/exercise needs.;Develop an individualized exercise prescription for aerobic and resistive training based on initial evaluation findings, risk stratification, comorbidities and participant's personal goals.       Expected Outcomes Achievement of increased cardiorespiratory fitness and enhanced flexibility, muscular endurance and strength shown through measurements of functional capacity and personal statement of participant.       Able to understand and use rate of perceived exertion (RPE) scale Yes       Intervention Provide education and explanation  on how to use RPE scale       Expected Outcomes Short Term: Able to use RPE daily in rehab to express subjective intensity level;Long Term:  Able to use RPE to guide intensity level when exercising independently       Able to understand and use Dyspnea scale Yes       Intervention Provide education and explanation on how to use Dyspnea scale       Expected  Outcomes Short Term: Able to use Dyspnea scale daily in rehab to express subjective sense of shortness of breath during exertion;Long Term: Able to use Dyspnea scale to guide intensity level when exercising independently       Knowledge and understanding of Target Heart Rate Range (THRR) Yes       Intervention Provide education and explanation of THRR including how the numbers were predicted and where they are located for reference       Expected Outcomes Short Term: Able to state/look up THRR;Long Term: Able to use THRR to govern intensity when exercising independently;Short Term: Able to use daily as guideline for intensity in rehab       Able to check pulse independently Yes       Intervention Provide education and demonstration on how to check pulse in carotid and radial arteries.;Review the importance of being able to check your own pulse for safety during independent exercise       Expected Outcomes Short Term: Able to explain why pulse checking is important during independent exercise;Long Term: Able to check pulse independently and accurately       Understanding of Exercise Prescription Yes       Intervention Provide education, explanation, and written materials on patient's individual exercise prescription       Expected Outcomes Short Term: Able to explain program exercise prescription;Long Term: Able to explain home exercise prescription to exercise independently          Copy of goals given to participant.

## 2017-07-27 ENCOUNTER — Telehealth: Payer: Self-pay

## 2017-07-27 DIAGNOSIS — J449 Chronic obstructive pulmonary disease, unspecified: Secondary | ICD-10-CM
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
## 2017-07-30 ENCOUNTER — Ambulatory Visit: Payer: Medicare Other

## 2017-08-02 ENCOUNTER — Ambulatory Visit: Payer: Medicare Other

## 2017-08-04 ENCOUNTER — Ambulatory Visit: Payer: Medicare Other

## 2017-08-06 ENCOUNTER — Ambulatory Visit: Payer: Medicare Other

## 2017-08-09 ENCOUNTER — Telehealth: Payer: Self-pay

## 2017-08-09 ENCOUNTER — Ambulatory Visit: Payer: Medicare Other

## 2017-08-09 NOTE — Telephone Encounter (Signed)
Called to check up on Jerry Jimenez. His wife states that he is feeling better and he may be able to attend next week. Informed them that he may need a doctors note to continue with rehab.

## 2017-08-11 ENCOUNTER — Ambulatory Visit: Payer: Medicare Other

## 2017-08-12 ENCOUNTER — Emergency Department
Admission: EM | Admit: 2017-08-12 | Discharge: 2017-08-12 | Disposition: A | Discharge status: Home / Self Care | Payer: Medicare Other | Attending: Emergency Medicine | Admitting: Emergency Medicine

## 2017-08-12 ENCOUNTER — Encounter: Payer: Self-pay | Admitting: Emergency Medicine

## 2017-08-12 ENCOUNTER — Emergency Department: Payer: Medicare Other

## 2017-08-12 DIAGNOSIS — Z87891 Personal history of nicotine dependence: Secondary | ICD-10-CM | POA: Insufficient documentation

## 2017-08-12 DIAGNOSIS — I509 Heart failure, unspecified: Secondary | ICD-10-CM | POA: Diagnosis not present

## 2017-08-12 DIAGNOSIS — R0602 Shortness of breath: Secondary | ICD-10-CM | POA: Diagnosis present

## 2017-08-12 DIAGNOSIS — J441 Chronic obstructive pulmonary disease with (acute) exacerbation: Secondary | ICD-10-CM | POA: Diagnosis not present

## 2017-08-12 DIAGNOSIS — I110 Hypertensive heart disease with heart failure: Secondary | ICD-10-CM | POA: Insufficient documentation

## 2017-08-12 HISTORY — DX: Essential (primary) hypertension: I10

## 2017-08-12 HISTORY — DX: Heart failure, unspecified: I50.9

## 2017-08-12 HISTORY — DX: Chronic obstructive pulmonary disease, unspecified: J44.9

## 2017-08-12 LAB — CBC
HCT: 40.5 % (ref 40.0–52.0)
HEMOGLOBIN: 13.8 g/dL (ref 13.0–18.0)
MCH: 28.5 pg (ref 26.0–34.0)
MCHC: 34.1 g/dL (ref 32.0–36.0)
MCV: 83.4 fL (ref 80.0–100.0)
PLATELETS: 396 10*3/uL (ref 150–440)
RBC: 4.86 MIL/uL (ref 4.40–5.90)
RDW: 16 % — ABNORMAL HIGH (ref 11.5–14.5)
WBC: 8.7 10*3/uL (ref 3.8–10.6)

## 2017-08-12 LAB — BASIC METABOLIC PANEL
ANION GAP: 12 (ref 5–15)
BUN: 24 mg/dL — ABNORMAL HIGH (ref 6–20)
CALCIUM: 9.1 mg/dL (ref 8.9–10.3)
CO2: 25 mmol/L (ref 22–32)
CREATININE: 1.18 mg/dL (ref 0.61–1.24)
Chloride: 98 mmol/L — ABNORMAL LOW (ref 101–111)
GFR, EST NON AFRICAN AMERICAN: 56 mL/min — AB (ref >60)
Glucose, Bld: 155 mg/dL — ABNORMAL HIGH (ref 65–99)
Potassium: 3.5 mmol/L (ref 3.5–5.1)
Sodium: 135 mmol/L (ref 135–145)

## 2017-08-12 LAB — TROPONIN I

## 2017-08-12 MED ORDER — AZITHROMYCIN 250 MG PO TABS: ORAL_TABLET | ORAL | 0 refills | Status: AC

## 2017-08-12 MED ORDER — PREDNISONE 10 MG PO TABS: 50.0000 mg | ORAL_TABLET | Freq: Every day | ORAL | 0 refills | Status: AC

## 2017-08-12 MED ORDER — IPRATROPIUM-ALBUTEROL 0.5-2.5 (3) MG/3ML IN SOLN
3.0000 mL | Freq: Once | RESPIRATORY_TRACT | Status: AC
  Administered 2017-08-12: 3 mL via RESPIRATORY_TRACT
  Filled 2017-08-12: qty 3

## 2017-08-12 MED ORDER — PREDNISONE 20 MG PO TABS
60.0000 mg | ORAL_TABLET | Freq: Once | ORAL | Status: AC
  Administered 2017-08-12: 60 mg via ORAL
  Filled 2017-08-12: qty 3

## 2017-08-12 NOTE — ED Triage Notes (Signed)
Pt c/o increased SOB this evening while pt was at rest. Pt has HX of COPD. Pt is not SOB in triage, O2 98% RA. Pt taken to room 13 for further eval.

## 2017-08-12 NOTE — ED Notes (Signed)

## 2017-08-12 NOTE — Discharge Instructions (Signed)
Please take all of your steroids and all of your antibiotics as prescribed. Follow up with your primary care physician as needed and return to the emergency department for any concerns.  It was a pleasure to take care of you today, and thank you for coming to our emergency department.  If you have any questions or concerns before leaving please ask the nurse to grab me and I'm more than happy to go through your aftercare instructions again.  If you were prescribed any opioid pain medication today such as Norco, Vicodin, Percocet, morphine, hydrocodone, or oxycodone please make sure you do not drive when you are taking this medication as it can alter your ability to drive safely.  If you have any concerns once you are home that you are not improving or are in fact getting worse before you can make it to your follow-up appointment, please do not hesitate to call 911 and come back for further evaluation.  Darel Hong, MD  Results for orders placed or performed during the hospital encounter of 51/02/58  Basic metabolic panel  Result Value Ref Range   Sodium 135 135 - 145 mmol/L   Potassium 3.5 3.5 - 5.1 mmol/L   Chloride 98 (L) 101 - 111 mmol/L   CO2 25 22 - 32 mmol/L   Glucose, Bld 155 (H) 65 - 99 mg/dL   BUN 24 (H) 6 - 20 mg/dL   Creatinine, Ser 1.18 0.61 - 1.24 mg/dL   Calcium 9.1 8.9 - 10.3 mg/dL   GFR calc non Af Amer 56 (L) >60 mL/min   GFR calc Af Amer >60 >60 mL/min   Anion gap 12 5 - 15  CBC  Result Value Ref Range   WBC 8.7 3.8 - 10.6 K/uL   RBC 4.86 4.40 - 5.90 MIL/uL   Hemoglobin 13.8 13.0 - 18.0 g/dL   HCT 40.5 40.0 - 52.0 %   MCV 83.4 80.0 - 100.0 fL   MCH 28.5 26.0 - 34.0 pg   MCHC 34.1 32.0 - 36.0 g/dL   RDW 16.0 (H) 11.5 - 14.5 %   Platelets 396 150 - 440 K/uL  Troponin I  Result Value Ref Range   Troponin I <0.03 <0.03 ng/mL   Dg Chest 2 View  Result Date: 08/12/2017 CLINICAL DATA:  Increasing shortness of breath. EXAM: CHEST  2 VIEW COMPARISON:  Chest x-ray  report dated August 06, 2017. FINDINGS: Postsurgical changes related to prior CABG. The cardiomediastinal silhouette is normal in size. Normal pulmonary vascularity. Mild bibasilar atelectasis. No focal consolidation, pleural effusion, or pneumothorax. Stable lower thoracic spine compression fractures. IMPRESSION: Prior CABG.  No active cardiopulmonary disease. Electronically Signed   By: Titus Dubin M.D.   On: 08/12/2017 21:57

## 2017-08-12 NOTE — ED Provider Notes (Signed)
[facility] Emergency Department Provider Note  ____________________________________________   First MD Initiated Contact with Patient 08/12/17 2142     (approximate)  I have reviewed the triage vital signs and the nursing notes.   HISTORY  Chief Complaint Shortness of Breath  HPI Jerry Jimenez is a 83 y.o. male who self presents to the emergency department with roughly 1 month of progressive shortness of breath. The shortness breath has been acutely worse for the past 24 hours. He has a past medical history of COPD but is not oxygen dependent at home. He does have multiple inhalers at home. He does report subjective fever but no chills. He does have somewhat of an increasingly productive cough. Moderate severity up her chest pain worse when coughing improved when not coughing.   Past Medical History:  Diagnosis Date  . CHF (congestive heart failure) ([place])   . COPD (chronic obstructive pulmonary disease) ([place])   . Hypertension     There are no active problems to display for this patient.   History reviewed. No pertinent surgical history.  Prior to Admission medications   Medication Sig Start Date End Date Taking? Authorizing Provider  azithromycin (ZITHROMAX Z-PAK) 250 MG tablet Take 2 tablets (500 mg) on  Day 1,  followed by 1 tablet (250 mg) once daily on Days 2 through 5. 08/12/17 08/17/17  Darel Hong, MD  predniSONE (DELTASONE) 10 MG tablet Take 5 tablets (50 mg total) by mouth daily. 08/12/17 08/16/17  Darel Hong, MD    Allergies Patient has no known allergies.  History reviewed. No pertinent family history.  Social History Social History  Substance Use Topics  . Smoking status: Former Smoker    Packs/day: 3.50    Years: 35.00    Types: Cigarettes    Quit date: 07/06/1987  . Smokeless tobacco: Former Systems developer    Types: Chew    Quit date: 07/05/2002  . Alcohol use Not on file    Review of Systems Constitutional: positive  for fever Eyes: No visual changes. ENT: No sore throat. Cardiovascular: positive for chest pain. Respiratory: positive for shortness of breath. Gastrointestinal: No abdominal pain.  No nausea, no vomiting.  No diarrhea.  No constipation. Genitourinary: Negative for dysuria. Musculoskeletal: Negative for back pain. Skin: Negative for rash. Neurological: Negative for headaches, focal weakness or numbness.   ____________________________________________   PHYSICAL EXAM:  VITAL SIGNS: ED Triage Vitals [08/12/17 2134]  Enc Vitals Group     BP 132/64     Pulse Rate 64     Resp 17     Temp 98.2 F (36.8 C)     Temp Source Oral     SpO2 98 %     Weight      Height      Head Circumference      Peak Flow      Pain Score      Pain Loc      Pain Edu?      Excl. in [place]?     Constitutional: alert and oriented 4 pleasant cooperative speaks in full clear sentences no diaphoresis Eyes: PERRL EOMI. Head: Atraumatic. Nose: No congestion/rhinnorhea. Mouth/Throat: No trismus Neck: No stridor.   Cardiovascular: Irregularly irregular normal rate grossly normal heart sounds Respiratory: slightly increased respiratory effort with mild expiratory wheezes throughout no rhonchi or rales Gastrointestinal: soft nontender Musculoskeletal: legs are equal in size Neurologic:  Normal speech and language. No gross focal neurologic deficits are appreciated. Skin:  Skin is  warm, dry and intact. No rash noted. Psychiatric: Mood and affect are normal. Speech and behavior are normal.    ____________________________________________   DIFFERENTIAL includes but not limited to  pneumonia, COPD exacerbation, pneumothorax, pulmonary embolism, congestive heart failure ____________________________________________   LABS (all labs ordered are listed, but only abnormal results are displayed)  Labs Reviewed  BASIC METABOLIC PANEL - Abnormal; Notable for the following:       Result Value   Chloride 98  (*)    Glucose, Bld 155 (*)    BUN 24 (*)    GFR calc non Af Amer 56 (*)    All other components within normal limits  CBC - Abnormal; Notable for the following:    RDW 16.0 (*)    All other components within normal limits  TROPONIN I    blood work reviewed and interpreted by me shows no acute disease __________________________________________  EKG  ED ECG REPORT I, Darel Hong, the attending physician, personally viewed and interpreted this ECG.  Date: 08/12/2017 EKG Time:  Rate: 73 Rhythm: atrial fibrillation with multiple premature ventricular complexes QRS Axis: normal Intervals: normal ST/T Wave abnormalities: normal Narrative Interpretation: no evidence of acute ischemia  ____________________________________________  RADIOLOGY  chest x-ray reviewed by me shows no acute disease ____________________________________________   PROCEDURES  Procedure(s) performed: no  Procedures  Critical Care performed: no  Observation: no ____________________________________________   INITIAL IMPRESSION / ASSESSMENT AND PLAN / ED COURSE  Pertinent labs & imaging results that were available during my care of the patient were reviewed by me and considered in my medical decision making (see chart for details).  The patient arrives quite well-appearing with slight wheezes although is moving good air. Chest x-ray shows no evidence of pneumonia or pneumothorax. He is saturating well on room air. Given his history of COPD I do think is reasonable to give HER-2 nebulizations as well as steroids. He likely will require a short course of antibiotics given his increasingly productive cough.    ----------------------------------------- 11:21 PM on 08/12/2017 -----------------------------------------  The patient feels markedly improved after 2 breathing treatments and would like to go home. I will discharge him with a short course of prednisone for 4 days as well as azithromycin  given his worsening productive cough. Strict return precautions were given and the patient verbalizes understanding and agreement with plan.  ____________________________________________   FINAL CLINICAL IMPRESSION(S) / ED DIAGNOSES  Final diagnoses:  COPD exacerbation ([place])      NEW MEDICATIONS STARTED DURING THIS VISIT:  New Prescriptions   AZITHROMYCIN (ZITHROMAX Z-PAK) 250 MG TABLET    Take 2 tablets (500 mg) on  Day 1,  followed by 1 tablet (250 mg) once daily on Days 2 through 5.   PREDNISONE (DELTASONE) 10 MG TABLET    Take 5 tablets (50 mg total) by mouth daily.     Note:  This document was prepared using Dragon voice recognition software and may include unintentional dictation errors.     Darel Hong, MD 08/12/17 2321

## 2017-08-13 ENCOUNTER — Ambulatory Visit: Payer: Medicare Other

## 2017-08-16 ENCOUNTER — Ambulatory Visit: Payer: Medicare Other

## 2017-08-16 ENCOUNTER — Telehealth: Payer: Self-pay

## 2017-08-16 DIAGNOSIS — J449 Chronic obstructive pulmonary disease, unspecified: Secondary | ICD-10-CM

## 2017-08-16 NOTE — Telephone Encounter (Signed)
Called Jerry Jimenez to see if he was doing ok. He had and ED visit on 08/12/17. His wife stated that he is to follow up with doctor Sabra Heck in two weeks to see if he can resume [facility].

## 2017-08-16 NOTE — Progress Notes (Signed)
Pulmonary Individual Treatment Plan  Patient Details  Name: Jerry Jimenez MRN: [id number] Date of Birth: 04-Jul-1934 Referring Provider:     Pulmonary Rehab from 07/20/2017 in [facility] Cardiac and Pulmonary Rehab  Referring Provider  Pugh      Initial Encounter Date:    Pulmonary Rehab from 07/20/2017 in [facility] Cardiac and Pulmonary Rehab  Date  07/20/17  Referring Provider  Pugh      Visit Diagnosis: Chronic obstructive pulmonary disease, unspecified COPD type ([place])  Patient's Home Medications on Admission:  Current Outpatient Prescriptions:  .  azithromycin (ZITHROMAX Z-PAK) 250 MG tablet, Take 2 tablets (500 mg) on  Day 1,  followed by 1 tablet (250 mg) once daily on Days 2 through 5., Disp: 6 each, Rfl: 0 .  predniSONE (DELTASONE) 10 MG tablet, Take 5 tablets (50 mg total) by mouth daily., Disp: 20 tablet, Rfl: 0  Past Medical History: Past Medical History:  Diagnosis Date  . CHF (congestive heart failure) ([place])   . COPD (chronic obstructive pulmonary disease) ([place])   . Hypertension     Tobacco Use: History  Smoking Status  . Former Smoker  . Packs/day: 3.50  . Years: 35.00  . Types: Cigarettes  . Quit date: 07/06/1987  Smokeless Tobacco  . Former Systems developer  . Types: Chew  . Quit date: 07/05/2002    Labs: Recent Review Flowsheet Data    There is no flowsheet data to display.       Pulmonary Assessment Scores:     Pulmonary Assessment Scores    Row Name 07/20/17 1130         ADL UCSD   ADL Phase Entry     SOB Score total 58     Rest 3     Walk 3     Stairs 3     Bath 1     Dress 2     Shop 3       CAT Score   CAT Score 21        Pulmonary Function Assessment:     Pulmonary Function Assessment - 07/20/17 1151      Initial Spirometry Results   FVC% 88 %   FEV1% 85 %   FEV1/FVC Ratio 65.72     Post Bronchodilator Spirometry Results   FVC% 87.9 %   FEV1% 83.3 %   FEV1/FVC Ratio 67.71     Breath   Bilateral Breath Sounds Clear   Shortness  of Breath Yes;Limiting activity      Exercise Target Goals:    Exercise Program Goal: Individual exercise prescription set with THRR, safety & activity barriers. Participant demonstrates ability to understand and report RPE using BORG scale, to self-measure pulse accurately, and to acknowledge the importance of the exercise prescription.  Exercise Prescription Goal: Starting with aerobic activity 30 plus minutes a day, 3 days per week for initial exercise prescription. Provide home exercise prescription and guidelines that participant acknowledges understanding prior to discharge.  Activity Barriers & Risk Stratification:   6 Minute Walk:     6 Minute Walk    Row Name 07/20/17 1237         6 Minute Walk   Distance 1280 feet     Walk Time 6 minutes     # of Rest Breaks 0     MPH 2.42     METS 2.48     RPE 12     Perceived Dyspnea  2     VO2 Peak 8.7  Symptoms No     Resting HR 75 bpm     Resting BP 146/80     Resting Oxygen Saturation  96 %     Exercise Oxygen Saturation  during 6 min walk 88 %     Max Ex. HR 111 bpm     Max Ex. BP 172/74     2 Minute Post BP 160/72       Interval HR   1 Minute HR 75     2 Minute HR 100     3 Minute HR 98     4 Minute HR 110     5 Minute HR 110     6 Minute HR 111     Interval Heart Rate? Yes       Interval Oxygen   Interval Oxygen? Yes     Baseline Oxygen Saturation % 96 %     1 Minute Oxygen Saturation % 93 %     1 Minute Liters of Oxygen 0 L     2 Minute Oxygen Saturation % 95 %     2 Minute Liters of Oxygen 0 L     3 Minute Oxygen Saturation % 94 %     3 Minute Liters of Oxygen 0 L     4 Minute Oxygen Saturation % 91 %     4 Minute Liters of Oxygen 0 L     5 Minute Oxygen Saturation % 91 %     5 Minute Liters of Oxygen 0 L     6 Minute Oxygen Saturation % 88 %     6 Minute Liters of Oxygen 0 L     2 Minute Post Oxygen Saturation % 95 %     2 Minute Post Liters of Oxygen 0 L       Oxygen Initial Assessment:      Oxygen Initial Assessment - 07/20/17 1140      Home Oxygen   Home Oxygen Device None   Sleep Oxygen Prescription None   Home Exercise Oxygen Prescription None   Home at Rest Exercise Oxygen Prescription None     Initial 6 min Walk   Oxygen Used None     Program Oxygen Prescription   Program Oxygen Prescription None     Intervention   Short Term Goals To learn and understand importance of maintaining oxygen saturations>88%;To learn and demonstrate proper use of respiratory medications;To learn and demonstrate proper pursed lip breathing techniques or other breathing techniques.;To learn and understand importance of monitoring SPO2 with pulse oximeter and demonstrate accurate use of the pulse oximeter.   Long  Term Goals Verbalizes importance of monitoring SPO2 with pulse oximeter and return demonstration;Exhibits proper breathing techniques, such as pursed lip breathing or other method taught during program session;Demonstrates proper use of MDI's;Compliance with respiratory medication;Maintenance of O2 saturations>88%      Oxygen Re-Evaluation:   Oxygen Discharge (Final Oxygen Re-Evaluation):   Initial Exercise Prescription:     Initial Exercise Prescription - 07/20/17 1200      Date of Initial Exercise RX and Referring Provider   Date 07/20/17   Referring Provider Pugh     Treadmill   MPH 2   Grade 0   Minutes 15   METs 2.53     Recumbant Bike   Level 2   RPM 60   Watts 15   Minutes 15     NuStep   Level 2   SPM 80   Minutes 15  METs 2.4     Biostep-RELP   Level 2   SPM 50   Minutes 15   METs 2.4     Prescription Details   Frequency (times per week) 3   Duration Progress to 45 minutes of aerobic exercise without signs/symptoms of physical distress     Intensity   THRR 40-80% of Max Heartrate 100-126   Ratings of Perceived Exertion 11-15   Perceived Dyspnea 0-4     Resistance Training   Training Prescription Yes   Weight 3 lb   Reps 10-15       Perform Capillary Blood Glucose checks as needed.  Exercise Prescription Changes:   Exercise Comments:   Exercise Goals and Review:     Exercise Goals    Row Name 07/20/17 1237             Exercise Goals   Increase Physical Activity Yes       Intervention Provide advice, education, support and counseling about physical activity/exercise needs.;Develop an individualized exercise prescription for aerobic and resistive training based on initial evaluation findings, risk stratification, comorbidities and participant's personal goals.       Expected Outcomes Achievement of increased cardiorespiratory fitness and enhanced flexibility, muscular endurance and strength shown through measurements of functional capacity and personal statement of participant.       Increase Strength and Stamina Yes       Intervention Provide advice, education, support and counseling about physical activity/exercise needs.;Develop an individualized exercise prescription for aerobic and resistive training based on initial evaluation findings, risk stratification, comorbidities and participant's personal goals.       Expected Outcomes Achievement of increased cardiorespiratory fitness and enhanced flexibility, muscular endurance and strength shown through measurements of functional capacity and personal statement of participant.       Able to understand and use rate of perceived exertion (RPE) scale Yes       Intervention Provide education and explanation on how to use RPE scale       Expected Outcomes Short Term: Able to use RPE daily in rehab to express subjective intensity level;Long Term:  Able to use RPE to guide intensity level when exercising independently       Able to understand and use Dyspnea scale Yes       Intervention Provide education and explanation on how to use Dyspnea scale       Expected Outcomes Short Term: Able to use Dyspnea scale daily in rehab to express subjective sense of shortness  of breath during exertion;Long Term: Able to use Dyspnea scale to guide intensity level when exercising independently       Knowledge and understanding of Target Heart Rate Range (THRR) Yes       Intervention Provide education and explanation of THRR including how the numbers were predicted and where they are located for reference       Expected Outcomes Short Term: Able to state/look up THRR;Long Term: Able to use THRR to govern intensity when exercising independently;Short Term: Able to use daily as guideline for intensity in rehab       Able to check pulse independently Yes       Intervention Provide education and demonstration on how to check pulse in carotid and radial arteries.;Review the importance of being able to check your own pulse for safety during independent exercise       Expected Outcomes Short Term: Able to explain why pulse checking is important during independent exercise;Long Term: Able to check  pulse independently and accurately       Understanding of Exercise Prescription Yes       Intervention Provide education, explanation, and written materials on patient's individual exercise prescription       Expected Outcomes Short Term: Able to explain program exercise prescription;Long Term: Able to explain home exercise prescription to exercise independently          Exercise Goals Re-Evaluation :   Discharge Exercise Prescription (Final Exercise Prescription Changes):   Nutrition:  Target Goals: Understanding of nutrition guidelines, daily intake of sodium 1500mg , cholesterol 200mg , calories 30% from fat and 7% or less from saturated fats, daily to have 5 or more servings of fruits and vegetables.  Biometrics:     Pre Biometrics - 07/20/17 1236      Pre Biometrics   Height 5\' 10"  (1.778 m)   Weight 221 lb 6.4 oz (100.4 kg)   Waist Circumference 42.5 inches   Hip Circumference 44 inches   Waist to Hip Ratio 0.97 %   BMI (Calculated) 31.77       Nutrition Therapy  Plan and Nutrition Goals:     Nutrition Therapy & Goals - 07/20/17 1130      Personal Nutrition Goals   Comments patient needs to fill handouts for the dietician. paperwork given to patient.     Intervention Plan   Intervention Prescribe, educate and counsel regarding individualized specific dietary modifications aiming towards targeted core components such as weight, hypertension, lipid management, diabetes, heart failure and other comorbidities.;Nutrition handout(s) given to patient.   Expected Outcomes Short Term Goal: Understand basic principles of dietary content, such as calories, fat, sodium, cholesterol and nutrients.;Short Term Goal: A plan has been developed with personal nutrition goals set during dietitian appointment.;Long Term Goal: Adherence to prescribed nutrition plan.      Nutrition Discharge: Rate Your Plate Scores:   Nutrition Goals Re-Evaluation:   Nutrition Goals Discharge (Final Nutrition Goals Re-Evaluation):   Psychosocial: Target Goals: Acknowledge presence or absence of significant depression and/or stress, maximize coping skills, provide positive support system. Participant is able to verbalize types and ability to use techniques and skills needed for reducing stress and depression.   Initial Review & Psychosocial Screening:     Initial Psych Review & Screening - 07/20/17 1126      Initial Review   Current issues with Current Stress Concerns   Source of Stress Concerns Unable to perform yard/household activities   Comments turns wooden bowles and collects old clocks for hobbies     [place]? Yes   Comments patient has 2 sons and a daughter that live within 10 miles of him.     Barriers   Psychosocial barriers to participate in program The patient should benefit from training in stress management and relaxation.     Screening Interventions   Interventions Yes;Encouraged to exercise;Program counselor consult;To provide  support and resources with identified psychosocial needs;Provide feedback about the scores to participant   Expected Outcomes Short Term goal: Utilizing psychosocial counselor, staff and physician to assist with identification of specific Stressors or current issues interfering with healing process. Setting desired goal for each stressor or current issue identified.;Long Term Goal: Stressors or current issues are controlled or eliminated.;Short Term goal: Identification and review with participant of any Quality of Life or Depression concerns found by scoring the questionnaire.;Long Term goal: The participant improves quality of Life and PHQ9 Scores as seen by post scores and/or verbalization of changes  Quality of Life Scores:   PHQ-9: Recent Review Flowsheet Data    Depression screen [facility] 2/9 07/20/2017   Decreased Interest 1   Down, Depressed, Hopeless 0   PHQ - 2 Score 1   Altered sleeping 3   Tired, decreased energy 3   Change in appetite 3   Feeling bad or failure about yourself  0   Trouble concentrating 0   Moving slowly or fidgety/restless 0   Suicidal thoughts 0   PHQ-9 Score 10   Difficult doing work/chores Not difficult at all     Interpretation of Total Score  Total Score Depression Severity:  1-4 = Minimal depression, 5-9 = Mild depression, 10-14 = Moderate depression, 15-19 = Moderately severe depression, 20-27 = Severe depression   Psychosocial Evaluation and Intervention:   Psychosocial Re-Evaluation:   Psychosocial Discharge (Final Psychosocial Re-Evaluation):   Education: Education Goals: Education classes will be provided on a weekly basis, covering required topics. Participant will state understanding/return demonstration of topics presented.  Learning Barriers/Preferences:     Learning Barriers/Preferences - 07/20/17 1317      Learning Barriers/Preferences   Learning Barriers Reading   Learning Preferences Individual Instruction;Verbal  Instruction;Video      Education Topics: Initial Evaluation Education: - Verbal, written and demonstration of respiratory meds, RPE/PD scales, oximetry and breathing techniques. Instruction on use of nebulizers and MDIs: cleaning and proper use, rinsing mouth with steroid doses and importance of monitoring MDI activations.   Pulmonary Rehab from 07/20/2017 in [facility] Cardiac and Pulmonary Rehab  Date  07/20/17  Educator  [facility]  Instruction Review Code  1- Verbalizes Understanding      General Nutrition Guidelines/Fats and Fiber: -Group instruction provided by verbal, written material, models and posters to present the general guidelines for heart healthy nutrition. Gives an explanation and review of dietary fats and fiber.   Pulmonary Rehab from 07/20/2017 in [facility] Cardiac and Pulmonary Rehab  Date  07/20/17  Educator  [facility]  Instruction Review Code  1- Verbalizes Understanding      Controlling Sodium/Reading Food Labels: -Group verbal and written material supporting the discussion of sodium use in heart healthy nutrition. Review and explanation with models, verbal and written materials for utilization of the food label.   Exercise Physiology & Risk Factors: - Group verbal and written instruction with models to review the exercise physiology of the cardiovascular system and associated critical values. Details cardiovascular disease risk factors and the goals associated with each risk factor.   Aerobic Exercise & Resistance Training: - Gives group verbal and written discussion on the health impact of inactivity. On the components of aerobic and resistive training programs and the benefits of this training and how to safely progress through these programs.   Flexibility, Balance, General Exercise Guidelines: - Provides group verbal and written instruction on the benefits of flexibility and balance training programs. Provides general exercise guidelines with specific guidelines to those with  heart or lung disease. Demonstration and skill practice provided.   Stress Management: - Provides group verbal and written instruction about the health risks of elevated stress, cause of high stress, and healthy ways to reduce stress.   Depression: - Provides group verbal and written instruction on the correlation between heart/lung disease and depressed mood, treatment options, and the stigmas associated with seeking treatment.   Exercise & Equipment Safety: - Individual verbal instruction and demonstration of equipment use and safety with use of the equipment.   Infection Prevention: - Provides verbal and written material to  individual with discussion of infection control including proper hand washing and proper equipment cleaning during exercise session.   Pulmonary Rehab from 07/20/2017 in [facility] Cardiac and Pulmonary Rehab  Date  07/20/17  Educator  [facility]  Instruction Review Code  1- Verbalizes Understanding      Falls Prevention: - Provides verbal and written material to individual with discussion of falls prevention and safety.   Pulmonary Rehab from 07/20/2017 in [facility] Cardiac and Pulmonary Rehab  Date  07/20/17  Educator  [facility]  Instruction Review Code  1- Verbalizes Understanding      Diabetes: - Individual verbal and written instruction to review signs/symptoms of diabetes, desired ranges of glucose level fasting, after meals and with exercise. Advice that pre and post exercise glucose checks will be done for 3 sessions at entry of program.   Chronic Lung Diseases: - Group verbal and written instruction to review new updates, new respiratory medications, new advancements in procedures and treatments. Provide informative websites and "800" numbers of self-education.   Lung Procedures: - Group verbal and written instruction to describe testing methods done to diagnose lung disease. Review the outcome of test results. Describe the treatment choices: Pulmonary Function Tests,  ABGs and oximetry.   Energy Conservation: - Provide group verbal and written instruction for methods to conserve energy, plan and organize activities. Instruct on pacing techniques, use of adaptive equipment and posture/positioning to relieve shortness of breath.   Triggers: - Group verbal and written instruction to review types of environmental controls: home humidity, furnaces, filters, dust mite/pet prevention, HEPA vacuums. To discuss weather changes, air quality and the benefits of nasal washing.   Exacerbations: - Group verbal and written instruction to provide: warning signs, infection symptoms, calling MD promptly, preventive modes, and value of vaccinations. Review: effective airway clearance, coughing and/or vibration techniques. Create an Sports administrator.   Oxygen: - Individual and group verbal and written instruction on oxygen therapy. Includes supplement oxygen, available portable oxygen systems, continuous and intermittent flow rates, oxygen safety, concentrators, and Medicare reimbursement for oxygen.   Respiratory Medications: - Group verbal and written instruction to review medications for lung disease. Drug class, frequency, complications, importance of spacers, rinsing mouth after steroid MDI's, and proper cleaning methods for nebulizers.   AED/CPR: - Group verbal and written instruction with the use of models to demonstrate the basic use of the AED with the basic ABC's of resuscitation.   Breathing Retraining: - Provides individuals verbal and written instruction on purpose, frequency, and proper technique of diaphragmatic breathing and pursed-lipped breathing. Applies individual practice skills.   Pulmonary Rehab from 07/20/2017 in [facility] Cardiac and Pulmonary Rehab  Date  07/20/17  Educator  [facility]  Instruction Review Code  1- Verbalizes Understanding      Anatomy and Physiology of the Lungs: - Group verbal and written instruction with the use of models to provide  basic lung anatomy and physiology related to function, structure and complications of lung disease.   Anatomy & Physiology of the Heart: - Group verbal and written instruction and models provide basic cardiac anatomy and physiology, with the coronary electrical and arterial systems. Review of: AMI, Angina, Valve disease, Heart Failure, Cardiac Arrhythmia, Pacemakers, and the ICD.   Heart Failure: - Group verbal and written instruction on the basics of heart failure: signs/symptoms, treatments, explanation of ejection fraction, enlarged heart and cardiomyopathy.   Sleep Apnea: - Individual verbal and written instruction to review Obstructive Sleep Apnea. Review of risk factors, methods for diagnosing and types of  masks and machines for OSA.   Anxiety: - Provides group, verbal and written instruction on the correlation between heart/lung disease and anxiety, treatment options, and management of anxiety.   Relaxation: - Provides group, verbal and written instruction about the benefits of relaxation for patients with heart/lung disease. Also provides patients with examples of relaxation techniques.   Cardiac Medications: - Group verbal and written instruction to review commonly prescribed medications for heart disease. Reviews the medication, class of the drug, and side effects.   Know Your Numbers: -Group verbal and written instruction about important numbers in your health.  Review of Cholesterol, Blood Pressure, Diabetes, and BMI and the role they play in your overall health.   Other: -Provides group and verbal instruction on various topics (see comments)    Knowledge Questionnaire Score:     Knowledge Questionnaire Score - 07/20/17 1132      Knowledge Questionnaire Score   Pre Score 5/10       Core Components/Risk Factors/Patient Goals at Admission:     Personal Goals and Risk Factors at Admission - 07/20/17 1143      Core Components/Risk Factors/Patient Goals on  Admission    Weight Management Yes   Intervention Weight Management: Develop a combined nutrition and exercise program designed to reach desired caloric intake, while maintaining appropriate intake of nutrient and fiber, sodium and fats, and appropriate energy expenditure required for the weight goal.;Weight Management: Provide education and appropriate resources to help participant work on and attain dietary goals.;Weight Management/Obesity: Establish reasonable short term and long term weight goals.   Admit Weight 222 lb (100.7 kg)   Goal Weight: Short Term 217 lb (98.4 kg)   Goal Weight: Long Term 175 lb (79.4 kg)   Expected Outcomes Short Term: Continue to assess and modify interventions until short term weight is achieved;Weight Maintenance: Understanding of the daily nutrition guidelines, which includes 25-35% calories from fat, 7% or less cal from saturated fats, less than 200mg  cholesterol, less than 1.5gm of sodium, & 5 or more servings of fruits and vegetables daily;Long Term: Adherence to nutrition and physical activity/exercise program aimed toward attainment of established weight goal;Weight Loss: Understanding of general recommendations for a balanced deficit meal plan, which promotes 1-2 lb weight loss per week and includes a negative energy balance of [phone] kcal/d;Understanding recommendations for meals to include 15-35% energy as protein, 25-35% energy from fat, 35-60% energy from carbohydrates, less than 200mg  of dietary cholesterol, 20-35 gm of total fiber daily;Understanding of distribution of calorie intake throughout the day with the consumption of 4-5 meals/snacks   Hypertension Yes   Intervention Provide education on lifestyle modifcations including regular physical activity/exercise, weight management, moderate sodium restriction and increased consumption of fresh fruit, vegetables, and low fat dairy, alcohol moderation, and smoking cessation.;Monitor prescription use compliance.    Expected Outcomes Short Term: Continued assessment and intervention until BP is < 140/45mm HG in hypertensive participants. < 130/17mm HG in hypertensive participants with diabetes, heart failure or chronic kidney disease.;Long Term: Maintenance of blood pressure at goal levels.   Lipids Yes   Intervention Provide education and support for participant on nutrition & aerobic/resistive exercise along with prescribed medications to achieve LDL 70mg , HDL >40mg .   Expected Outcomes Short Term: Participant states understanding of desired cholesterol values and is compliant with medications prescribed. Participant is following exercise prescription and nutrition guidelines.;Long Term: Cholesterol controlled with medications as prescribed, with individualized exercise RX and with personalized nutrition plan. Value goals: LDL < 70mg , HDL > 40  mg.   Stress Yes   Intervention Offer individual and/or small group education and counseling on adjustment to heart disease, stress management and health-related lifestyle change. Teach and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

## 2017-08-18 ENCOUNTER — Ambulatory Visit: Payer: Medicare Other

## 2017-08-19 ENCOUNTER — Encounter: Payer: Self-pay | Admitting: Emergency Medicine

## 2017-08-19 ENCOUNTER — Emergency Department: Payer: Medicare Other

## 2017-08-19 ENCOUNTER — Other Ambulatory Visit: Payer: Self-pay

## 2017-08-19 ENCOUNTER — Telehealth: Payer: Self-pay

## 2017-08-19 DIAGNOSIS — Z7982 Long term (current) use of aspirin: Secondary | ICD-10-CM

## 2017-08-19 DIAGNOSIS — Z87891 Personal history of nicotine dependence: Secondary | ICD-10-CM | POA: Diagnosis not present

## 2017-08-19 DIAGNOSIS — Z79891 Long term (current) use of opiate analgesic: Secondary | ICD-10-CM

## 2017-08-19 DIAGNOSIS — Z825 Family history of asthma and other chronic lower respiratory diseases: Secondary | ICD-10-CM

## 2017-08-19 DIAGNOSIS — Z951 Presence of aortocoronary bypass graft: Secondary | ICD-10-CM

## 2017-08-19 DIAGNOSIS — J441 Chronic obstructive pulmonary disease with (acute) exacerbation: Principal | ICD-10-CM | POA: Diagnosis present

## 2017-08-19 DIAGNOSIS — S8011XD Contusion of right lower leg, subsequent encounter: Secondary | ICD-10-CM

## 2017-08-19 DIAGNOSIS — I110 Hypertensive heart disease with heart failure: Secondary | ICD-10-CM | POA: Diagnosis not present

## 2017-08-19 DIAGNOSIS — Z7984 Long term (current) use of oral hypoglycemic drugs: Secondary | ICD-10-CM | POA: Diagnosis not present

## 2017-08-19 DIAGNOSIS — Z8249 Family history of ischemic heart disease and other diseases of the circulatory system: Secondary | ICD-10-CM | POA: Diagnosis not present

## 2017-08-19 DIAGNOSIS — I509 Heart failure, unspecified: Secondary | ICD-10-CM | POA: Diagnosis present

## 2017-08-19 DIAGNOSIS — Z7901 Long term (current) use of anticoagulants: Secondary | ICD-10-CM

## 2017-08-19 DIAGNOSIS — I2510 Atherosclerotic heart disease of native coronary artery without angina pectoris: Secondary | ICD-10-CM | POA: Diagnosis present

## 2017-08-19 DIAGNOSIS — R918 Other nonspecific abnormal finding of lung field: Secondary | ICD-10-CM | POA: Diagnosis present

## 2017-08-19 DIAGNOSIS — I482 Chronic atrial fibrillation: Secondary | ICD-10-CM | POA: Diagnosis not present

## 2017-08-19 DIAGNOSIS — M549 Dorsalgia, unspecified: Secondary | ICD-10-CM | POA: Diagnosis present

## 2017-08-19 DIAGNOSIS — Z79899 Other long term (current) drug therapy: Secondary | ICD-10-CM | POA: Diagnosis not present

## 2017-08-19 DIAGNOSIS — J449 Chronic obstructive pulmonary disease, unspecified: Secondary | ICD-10-CM

## 2017-08-19 LAB — BASIC METABOLIC PANEL
ANION GAP: 8 (ref 5–15)
BUN: 20 mg/dL (ref 6–20)
CALCIUM: 8.5 mg/dL — AB (ref 8.9–10.3)
CHLORIDE: 104 mmol/L (ref 101–111)
CO2: 24 mmol/L (ref 22–32)
Creatinine, Ser: 0.93 mg/dL (ref 0.61–1.24)
GFR calc non Af Amer: >60 mL/min (ref >60)
GLUCOSE: 192 mg/dL — AB (ref 65–99)
POTASSIUM: 4.2 mmol/L (ref 3.5–5.1)
Sodium: 136 mmol/L (ref 135–145)

## 2017-08-19 LAB — CBC
HEMATOCRIT: 43.1 % (ref 40.0–52.0)
HEMOGLOBIN: 14.1 g/dL (ref 13.0–18.0)
MCH: 27.9 pg (ref 26.0–34.0)
MCHC: 32.6 g/dL (ref 32.0–36.0)
MCV: 85.4 fL (ref 80.0–100.0)
Platelets: 286 10*3/uL (ref 150–440)
RBC: 5.05 MIL/uL (ref 4.40–5.90)
RDW: 16.1 % — ABNORMAL HIGH (ref 11.5–14.5)
WBC: 8 10*3/uL (ref 3.8–10.6)

## 2017-08-19 LAB — TROPONIN I

## 2017-08-19 MED ORDER — ALBUTEROL SULFATE (2.5 MG/3ML) 0.083% IN NEBU
5.0000 mg | INHALATION_SOLUTION | Freq: Once | RESPIRATORY_TRACT | Status: AC
  Administered 2017-08-19: 5 mg via RESPIRATORY_TRACT
  Filled 2017-08-19: qty 6

## 2017-08-19 NOTE — Progress Notes (Signed)
Pulmonary Individual Treatment Plan  Patient Details  Name: JEMIAH ELLENBURG MRN: [id number] Date of Birth: August 03, 1934 Referring Provider:     Pulmonary Rehab from 07/20/2017 in [facility] Pulmonary Rehab  Referring Provider  Pugh      Initial Encounter Date:    Pulmonary Rehab from 07/20/2017 in [facility] Cardiac and Pulmonary Rehab  Date  07/20/17  Referring Provider  Pugh      Visit Diagnosis: Chronic obstructive pulmonary disease, unspecified COPD type ([place])  Patient's Home Medications on Admission: No current outpatient prescriptions on file.  Past Medical History: Past Medical History:  Diagnosis Date  . CHF (congestive heart failure) ([place])   . COPD (chronic obstructive pulmonary disease) ([place])   . Hypertension     Tobacco Use: History  Smoking Status  . Former Smoker  . Packs/day: 3.50  . Years: 35.00  . Types: Cigarettes  . Quit date: 07/06/1987  Smokeless Tobacco  . Former Systems developer  . Types: Chew  . Quit date: 07/05/2002    Labs: Recent Review Flowsheet Data    There is no flowsheet data to display.       Pulmonary Assessment Scores:     Pulmonary Assessment Scores    Row Name 07/20/17 1130         ADL UCSD   ADL Phase Entry     SOB Score total 58     Rest 3     Walk 3     Stairs 3     Bath 1     Dress 2     Shop 3       CAT Score   CAT Score 21        Pulmonary Function Assessment:     Pulmonary Function Assessment - 07/20/17 1151      Initial Spirometry Results   FVC% 88 %   FEV1% 85 %   FEV1/FVC Ratio 65.72     Post Bronchodilator Spirometry Results   FVC% 87.9 %   FEV1% 83.3 %   FEV1/FVC Ratio 67.71     Breath   Bilateral Breath Sounds Clear   Shortness of Breath Yes;Limiting activity      Exercise Target Goals:    Exercise Program Goal: Individual exercise prescription set with THRR, safety & activity barriers. Participant demonstrates ability to understand and report RPE using BORG scale, to self-measure pulse  accurately, and to acknowledge the importance of the exercise prescription.  Exercise Prescription Goal: Starting with aerobic activity 30 plus minutes a day, 3 days per week for initial exercise prescription. Provide home exercise prescription and guidelines that participant acknowledges understanding prior to discharge.  Activity Barriers & Risk Stratification:   6 Minute Walk:     6 Minute Walk    Row Name 07/20/17 1237         6 Minute Walk   Distance 1280 feet     Walk Time 6 minutes     # of Rest Breaks 0     MPH 2.42     METS 2.48     RPE 12     Perceived Dyspnea  2     VO2 Peak 8.7     Symptoms No     Resting HR 75 bpm     Resting BP 146/80     Resting Oxygen Saturation  96 %     Exercise Oxygen Saturation  during 6 min walk 88 %     Max Ex. HR 111 bpm  Max Ex. BP 172/74     2 Minute Post BP 160/72       Interval HR   1 Minute HR 75     2 Minute HR 100     3 Minute HR 98     4 Minute HR 110     5 Minute HR 110     6 Minute HR 111     Interval Heart Rate? Yes       Interval Oxygen   Interval Oxygen? Yes     Baseline Oxygen Saturation % 96 %     1 Minute Oxygen Saturation % 93 %     1 Minute Liters of Oxygen 0 L     2 Minute Oxygen Saturation % 95 %     2 Minute Liters of Oxygen 0 L     3 Minute Oxygen Saturation % 94 %     3 Minute Liters of Oxygen 0 L     4 Minute Oxygen Saturation % 91 %     4 Minute Liters of Oxygen 0 L     5 Minute Oxygen Saturation % 91 %     5 Minute Liters of Oxygen 0 L     6 Minute Oxygen Saturation % 88 %     6 Minute Liters of Oxygen 0 L     2 Minute Post Oxygen Saturation % 95 %     2 Minute Post Liters of Oxygen 0 L       Oxygen Initial Assessment:     Oxygen Initial Assessment - 07/20/17 1140      Home Oxygen   Home Oxygen Device None   Sleep Oxygen Prescription None   Home Exercise Oxygen Prescription None   Home at Rest Exercise Oxygen Prescription None     Initial 6 min Walk   Oxygen Used None      Program Oxygen Prescription   Program Oxygen Prescription None     Intervention   Short Term Goals To learn and understand importance of maintaining oxygen saturations>88%;To learn and demonstrate proper use of respiratory medications;To learn and demonstrate proper pursed lip breathing techniques or other breathing techniques.;To learn and understand importance of monitoring SPO2 with pulse oximeter and demonstrate accurate use of the pulse oximeter.   Long  Term Goals Verbalizes importance of monitoring SPO2 with pulse oximeter and return demonstration;Exhibits proper breathing techniques, such as pursed lip breathing or other method taught during program session;Demonstrates proper use of MDI's;Compliance with respiratory medication;Maintenance of O2 saturations>88%      Oxygen Re-Evaluation:   Oxygen Discharge (Final Oxygen Re-Evaluation):   Initial Exercise Prescription:     Initial Exercise Prescription - 07/20/17 1200      Date of Initial Exercise RX and Referring Provider   Date 07/20/17   Referring Provider Pugh     Treadmill   MPH 2   Grade 0   Minutes 15   METs 2.53     Recumbant Bike   Level 2   RPM 60   Watts 15   Minutes 15     NuStep   Level 2   SPM 80   Minutes 15   METs 2.4     Biostep-RELP   Level 2   SPM 50   Minutes 15   METs 2.4     Prescription Details   Frequency (times per week) 3   Duration Progress to 45 minutes of aerobic exercise without signs/symptoms of physical distress  Intensity   THRR 40-80% of Max Heartrate 100-126   Ratings of Perceived Exertion 11-15   Perceived Dyspnea 0-4     Resistance Training   Training Prescription Yes   Weight 3 lb   Reps 10-15      Perform Capillary Blood Glucose checks as needed.  Exercise Prescription Changes:   Exercise Comments:   Exercise Goals and Review:     Exercise Goals    Row Name 07/20/17 1237             Exercise Goals   Increase Physical Activity Yes        Intervention Provide advice, education, support and counseling about physical activity/exercise needs.;Develop an individualized exercise prescription for aerobic and resistive training based on initial evaluation findings, risk stratification, comorbidities and participant's personal goals.       Expected Outcomes Achievement of increased cardiorespiratory fitness and enhanced flexibility, muscular endurance and strength shown through measurements of functional capacity and personal statement of participant.       Increase Strength and Stamina Yes       Intervention Provide advice, education, support and counseling about physical activity/exercise needs.;Develop an individualized exercise prescription for aerobic and resistive training based on initial evaluation findings, risk stratification, comorbidities and participant's personal goals.       Expected Outcomes Achievement of increased cardiorespiratory fitness and enhanced flexibility, muscular endurance and strength shown through measurements of functional capacity and personal statement of participant.       Able to understand and use rate of perceived exertion (RPE) scale Yes       Intervention Provide education and explanation on how to use RPE scale       Expected Outcomes Short Term: Able to use RPE daily in rehab to express subjective intensity level;Long Term:  Able to use RPE to guide intensity level when exercising independently       Able to understand and use Dyspnea scale Yes       Intervention Provide education and explanation on how to use Dyspnea scale       Expected Outcomes Short Term: Able to use Dyspnea scale daily in rehab to express subjective sense of shortness of breath during exertion;Long Term: Able to use Dyspnea scale to guide intensity level when exercising independently       Knowledge and understanding of Target Heart Rate Range (THRR) Yes       Intervention Provide education and explanation of THRR including how  the numbers were predicted and where they are located for reference       Expected Outcomes Short Term: Able to state/look up THRR;Long Term: Able to use THRR to govern intensity when exercising independently;Short Term: Able to use daily as guideline for intensity in rehab       Able to check pulse independently Yes       Intervention Provide education and demonstration on how to check pulse in carotid and radial arteries.;Review the importance of being able to check your own pulse for safety during independent exercise       Expected Outcomes Short Term: Able to explain why pulse checking is important during independent exercise;Long Term: Able to check pulse independently and accurately       Understanding of Exercise Prescription Yes       Intervention Provide education, explanation, and written materials on patient's individual exercise prescription       Expected Outcomes Short Term: Able to explain program exercise prescription;Long Term: Able to explain home exercise  prescription to exercise independently          Exercise Goals Re-Evaluation :   Discharge Exercise Prescription (Final Exercise Prescription Changes):   Nutrition:  Target Goals: Understanding of nutrition guidelines, daily intake of sodium 1500mg , cholesterol 200mg , calories 30% from fat and 7% or less from saturated fats, daily to have 5 or more servings of fruits and vegetables.  Biometrics:     Pre Biometrics - 07/20/17 1236      Pre Biometrics   Height 5\' 10"  (1.778 m)   Weight 221 lb 6.4 oz (100.4 kg)   Waist Circumference 42.5 inches   Hip Circumference 44 inches   Waist to Hip Ratio 0.97 %   BMI (Calculated) 31.77       Nutrition Therapy Plan and Nutrition Goals:     Nutrition Therapy & Goals - 07/20/17 1130      Personal Nutrition Goals   Comments patient needs to fill handouts for the dietician. paperwork given to patient.     Intervention Plan   Intervention Prescribe, educate and  counsel regarding individualized specific dietary modifications aiming towards targeted core components such as weight, hypertension, lipid management, diabetes, heart failure and other comorbidities.;Nutrition handout(s) given to patient.   Expected Outcomes Short Term Goal: Understand basic principles of dietary content, such as calories, fat, sodium, cholesterol and nutrients.;Short Term Goal: A plan has been developed with personal nutrition goals set during dietitian appointment.;Long Term Goal: Adherence to prescribed nutrition plan.      Nutrition Discharge: Rate Your Plate Scores:   Nutrition Goals Re-Evaluation:   Nutrition Goals Discharge (Final Nutrition Goals Re-Evaluation):   Psychosocial: Target Goals: Acknowledge presence or absence of significant depression and/or stress, maximize coping skills, provide positive support system. Participant is able to verbalize types and ability to use techniques and skills needed for reducing stress and depression.   Initial Review & Psychosocial Screening:     Initial Psych Review & Screening - 07/20/17 1126      Initial Review   Current issues with Current Stress Concerns   Source of Stress Concerns Unable to perform yard/household activities   Comments turns wooden bowles and collects old clocks for hobbies     [place]? Yes   Comments patient has 2 sons and a daughter that live within 10 miles of him.     Barriers   Psychosocial barriers to participate in program The patient should benefit from training in stress management and relaxation.     Screening Interventions   Interventions Yes;Encouraged to exercise;Program counselor consult;To provide support and resources with identified psychosocial needs;Provide feedback about the scores to participant   Expected Outcomes Short Term goal: Utilizing psychosocial counselor, staff and physician to assist with identification of specific Stressors or  current issues interfering with healing process. Setting desired goal for each stressor or current issue identified.;Long Term Goal: Stressors or current issues are controlled or eliminated.;Short Term goal: Identification and review with participant of any Quality of Life or Depression concerns found by scoring the questionnaire.;Long Term goal: The participant improves quality of Life and PHQ9 Scores as seen by post scores and/or verbalization of changes      Quality of Life Scores:   PHQ-9: Recent Review Flowsheet Data    Depression screen [facility] 2/9 07/20/2017   Decreased Interest 1   Down, Depressed, Hopeless 0   PHQ - 2 Score 1   Altered sleeping 3   Tired, decreased energy 3   Change  in appetite 3   Feeling bad or failure about yourself  0   Trouble concentrating 0   Moving slowly or fidgety/restless 0   Suicidal thoughts 0   PHQ-9 Score 10   Difficult doing work/chores Not difficult at all     Interpretation of Total Score  Total Score Depression Severity:  1-4 = Minimal depression, 5-9 = Mild depression, 10-14 = Moderate depression, 15-19 = Moderately severe depression, 20-27 = Severe depression   Psychosocial Evaluation and Intervention:   Psychosocial Re-Evaluation:   Psychosocial Discharge (Final Psychosocial Re-Evaluation):   Education: Education Goals: Education classes will be provided on a weekly basis, covering required topics. Participant will state understanding/return demonstration of topics presented.  Learning Barriers/Preferences:     Learning Barriers/Preferences - 07/20/17 1317      Learning Barriers/Preferences   Learning Barriers Reading   Learning Preferences Individual Instruction;Verbal Instruction;Video      Education Topics: Initial Evaluation Education: - Verbal, written and demonstration of respiratory meds, RPE/PD scales, oximetry and breathing techniques. Instruction on use of nebulizers and MDIs: cleaning and proper use, rinsing  mouth with steroid doses and importance of monitoring MDI activations.   Pulmonary Rehab from 07/20/2017 in [facility] Cardiac and Pulmonary Rehab  Date  07/20/17  Educator  [facility]  Instruction Review Code  1- Verbalizes Understanding      General Nutrition Guidelines/Fats and Fiber: -Group instruction provided by verbal, written material, models and posters to present the general guidelines for heart healthy nutrition. Gives an explanation and review of dietary fats and fiber.   Pulmonary Rehab from 07/20/2017 in [facility] Cardiac and Pulmonary Rehab  Date  07/20/17  Educator  [facility]  Instruction Review Code  1- Verbalizes Understanding      Controlling Sodium/Reading Food Labels: -Group verbal and written material supporting the discussion of sodium use in heart healthy nutrition. Review and explanation with models, verbal and written materials for utilization of the food label.   Exercise Physiology & Risk Factors: - Group verbal and written instruction with models to review the exercise physiology of the cardiovascular system and associated critical values. Details cardiovascular disease risk factors and the goals associated with each risk factor.   Aerobic Exercise & Resistance Training: - Gives group verbal and written discussion on the health impact of inactivity. On the components of aerobic and resistive training programs and the benefits of this training and how to safely progress through these programs.   Flexibility, Balance, General Exercise Guidelines: - Provides group verbal and written instruction on the benefits of flexibility and balance training programs. Provides general exercise guidelines with specific guidelines to those with heart or lung disease. Demonstration and skill practice provided.   Stress Management: - Provides group verbal and written instruction about the health risks of elevated stress, cause of high stress, and healthy ways to reduce stress.   Depression: -  Provides group verbal and written instruction on the correlation between heart/lung disease and depressed mood, treatment options, and the stigmas associated with seeking treatment.   Exercise & Equipment Safety: - Individual verbal instruction and demonstration of equipment use and safety with use of the equipment.   Infection Prevention: - Provides verbal and written material to individual with discussion of infection control including proper hand washing and proper equipment cleaning during exercise session.   Pulmonary Rehab from 07/20/2017 in [facility] Cardiac and Pulmonary Rehab  Date  07/20/17  Educator  [facility]  Instruction Review Code  1- [employer]  Prevention: - Provides verbal and written material to individual with discussion of falls prevention and safety.   Pulmonary Rehab from 07/20/2017 in [facility] Cardiac and Pulmonary Rehab  Date  07/20/17  Educator  [facility]  Instruction Review Code  1- Verbalizes Understanding      Diabetes: - Individual verbal and written instruction to review signs/symptoms of diabetes, desired ranges of glucose level fasting, after meals and with exercise. Advice that pre and post exercise glucose checks will be done for 3 sessions at entry of program.   Chronic Lung Diseases: - Group verbal and written instruction to review new updates, new respiratory medications, new advancements in procedures and treatments. Provide informative websites and "800" numbers of self-education.   Lung Procedures: - Group verbal and written instruction to describe testing methods done to diagnose lung disease. Review the outcome of test results. Describe the treatment choices: Pulmonary Function Tests, ABGs and oximetry.   Energy Conservation: - Provide group verbal and written instruction for methods to conserve energy, plan and organize activities. Instruct on pacing techniques, use of adaptive equipment and posture/positioning to relieve shortness of  breath.   Triggers: - Group verbal and written instruction to review types of environmental controls: home humidity, furnaces, filters, dust mite/pet prevention, HEPA vacuums. To discuss weather changes, air quality and the benefits of nasal washing.   Exacerbations: - Group verbal and written instruction to provide: warning signs, infection symptoms, calling MD promptly, preventive modes, and value of vaccinations. Review: effective airway clearance, coughing and/or vibration techniques. Create an Sports administrator.   Oxygen: - Individual and group verbal and written instruction on oxygen therapy. Includes supplement oxygen, available portable oxygen systems, continuous and intermittent flow rates, oxygen safety, concentrators, and Medicare reimbursement for oxygen.   Respiratory Medications: - Group verbal and written instruction to review medications for lung disease. Drug class, frequency, complications, importance of spacers, rinsing mouth after steroid MDI's, and proper cleaning methods for nebulizers.   AED/CPR: - Group verbal and written instruction with the use of models to demonstrate the basic use of the AED with the basic ABC's of resuscitation.   Breathing Retraining: - Provides individuals verbal and written instruction on purpose, frequency, and proper technique of diaphragmatic breathing and pursed-lipped breathing. Applies individual practice skills.   Pulmonary Rehab from 07/20/2017 in [facility] Cardiac and Pulmonary Rehab  Date  07/20/17  Educator  [facility]  Instruction Review Code  1- Verbalizes Understanding      Anatomy and Physiology of the Lungs: - Group verbal and written instruction with the use of models to provide basic lung anatomy and physiology related to function, structure and complications of lung disease.   Anatomy & Physiology of the Heart: - Group verbal and written instruction and models provide basic cardiac anatomy and physiology, with the coronary  electrical and arterial systems. Review of: AMI, Angina, Valve disease, Heart Failure, Cardiac Arrhythmia, Pacemakers, and the ICD.   Heart Failure: - Group verbal and written instruction on the basics of heart failure: signs/symptoms, treatments, explanation of ejection fraction, enlarged heart and cardiomyopathy.   Sleep Apnea: - Individual verbal and written instruction to review Obstructive Sleep Apnea. Review of risk factors, methods for diagnosing and types of masks and machines for OSA.   Anxiety: - Provides group, verbal and written instruction on the correlation between heart/lung disease and anxiety, treatment options, and management of anxiety.   Relaxation: - Provides group, verbal and written instruction about the benefits of relaxation for patients with heart/lung disease. Also provides  patients with examples of relaxation techniques.   Cardiac Medications: - Group verbal and written instruction to review commonly prescribed medications for heart disease. Reviews the medication, class of the drug, and side effects.   Know Your Numbers: -Group verbal and written instruction about important numbers in your health.  Review of Cholesterol, Blood Pressure, Diabetes, and BMI and the role they play in your overall health.   Other: -Provides group and verbal instruction on various topics (see comments)    Knowledge Questionnaire Score:     Knowledge Questionnaire Score - 07/20/17 1132      Knowledge Questionnaire Score   Pre Score 5/10       Core Components/Risk Factors/Patient Goals at Admission:     Personal Goals and Risk Factors at Admission - 07/20/17 1143      Core Components/Risk Factors/Patient Goals on Admission    Weight Management Yes   Intervention Weight Management: Develop a combined nutrition and exercise program designed to reach desired caloric intake, while maintaining appropriate intake of nutrient and fiber, sodium and fats, and appropriate  energy expenditure required for the weight goal.;Weight Management: Provide education and appropriate resources to help participant work on and attain dietary goals.;Weight Management/Obesity: Establish reasonable short term and long term weight goals.   Admit Weight 222 lb (100.7 kg)   Goal Weight: Short Term 217 lb (98.4 kg)   Goal Weight: Long Term 175 lb (79.4 kg)   Expected Outcomes Short Term: Continue to assess and modify interventions until short term weight is achieved;Weight Maintenance: Understanding of the daily nutrition guidelines, which includes 25-35% calories from fat, 7% or less cal from saturated fats, less than 200mg  cholesterol, less than 1.5gm of sodium, & 5 or more servings of fruits and vegetables daily;Long Term: Adherence to nutrition and physical activity/exercise program aimed toward attainment of established weight goal;Weight Loss: Understanding of general recommendations for a balanced deficit meal plan, which promotes 1-2 lb weight loss per week and includes a negative energy balance of [phone] kcal/d;Understanding recommendations for meals to include 15-35% energy as protein, 25-35% energy from fat, 35-60% energy from carbohydrates, less than 200mg  of dietary cholesterol, 20-35 gm of total fiber daily;Understanding of distribution of calorie intake throughout the day with the consumption of 4-5 meals/snacks   Hypertension Yes   Intervention Provide education on lifestyle modifcations including regular physical activity/exercise, weight management, moderate sodium restriction and increased consumption of fresh fruit, vegetables, and low fat dairy, alcohol moderation, and smoking cessation.;Monitor prescription use compliance.   Expected Outcomes Short Term: Continued assessment and intervention until BP is < 140/12mm HG in hypertensive participants. < 130/49mm HG in hypertensive participants with diabetes, heart failure or chronic kidney disease.;Long Term: Maintenance of  blood pressure at goal levels.   Lipids Yes   Intervention Provide education and support for participant on nutrition & aerobic/resistive exercise along with prescribed medications to achieve LDL 70mg , HDL >40mg .   Expected Outcomes Short Term: Participant states understanding of desired cholesterol values and is compliant with medications prescribed. Participant is following exercise prescription and nutrition guidelines.;Long Term: Cholesterol controlled with medications as prescribed, with individualized exercise RX and with personalized nutrition plan. Value goals: LDL < 70mg , HDL > 40 mg.   Stress Yes   Intervention Offer individual and/or small group education and counseling on adjustment to heart disease, stress management and health-related lifestyle change. Teach and support self-help strategies.;Refer participants experiencing significant psychosocial distress to appropriate mental health specialists for further evaluation and treatment. When possible, include family  members and significant others in education/counseling sessions.   Expected Outcomes Short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

## 2017-08-19 NOTE — ED Triage Notes (Signed)
Pt comes into the ED via POV c/o shortness of breath associated with his COPD.  Patient denies any fevers, chest pain, or dizziness.  Patient has labored breathing with strenuous activity but denies any success with his home inhalers.

## 2017-08-19 NOTE — Progress Notes (Signed)
Discharge Progress Report  Patient Details  Name: Jerry Jimenez MRN: [id number] Date of Birth: 10/15/1934 Referring Provider:     Pulmonary Rehab from 07/20/2017 in [facility]  Referring Provider  Pugh       Number of Visits: 1/36  Reason for Discharge:  Early Exit:  Personal, Lack of attendance and health status change  Smoking History:  History  Smoking Status  . Former Smoker  . Packs/day: 3.50  . Years: 35.00  . Types: Cigarettes  . Quit date: 07/06/1987  Smokeless Tobacco  . Former Systems developer  . Types: Chew  . Quit date: 07/05/2002    Diagnosis:  Chronic obstructive pulmonary disease, unspecified COPD type ([place])  ADL UCSD:     Pulmonary Assessment Scores    Row Name 07/20/17 1130         ADL UCSD   ADL Phase Entry     SOB Score total 58     Rest 3     Walk 3     Stairs 3     Bath 1     Dress 2     Shop 3       CAT Score   CAT Score 21        Initial Exercise Prescription:     Initial Exercise Prescription - 07/20/17 1200      Date of Initial Exercise RX and Referring Provider   Date 07/20/17   Referring Provider Pugh     Treadmill   MPH 2   Grade 0   Minutes 15   METs 2.53     Recumbant Bike   Level 2   RPM 60   Watts 15   Minutes 15     NuStep   Level 2   SPM 80   Minutes 15   METs 2.4     Biostep-RELP   Level 2   SPM 50   Minutes 15   METs 2.4     Prescription Details   Frequency (times per week) 3   Duration Progress to 45 minutes of aerobic exercise without signs/symptoms of physical distress     Intensity   THRR 40-80% of Max Heartrate 100-126   Ratings of Perceived Exertion 11-15   Perceived Dyspnea 0-4     Resistance Training   Training Prescription Yes   Weight 3 lb   Reps 10-15      Discharge Exercise Prescription (Final Exercise Prescription Changes):   Functional Capacity:     6 Minute Walk    Row Name 07/20/17 1237         6 Minute Walk   Distance 1280 feet     Walk  Time 6 minutes     # of Rest Breaks 0     MPH 2.42     METS 2.48     RPE 12     Perceived Dyspnea  2     VO2 Peak 8.7     Symptoms No     Resting HR 75 bpm     Resting BP 146/80     Resting Oxygen Saturation  96 %     Exercise Oxygen Saturation  during 6 min walk 88 %     Max Ex. HR 111 bpm     Max Ex. BP 172/74     2 Minute Post BP 160/72       Interval HR   1 Minute HR 75     2 Minute HR  100     3 Minute HR 98     4 Minute HR 110     5 Minute HR 110     6 Minute HR 111     Interval Heart Rate? Yes       Interval Oxygen   Interval Oxygen? Yes     Baseline Oxygen Saturation % 96 %     1 Minute Oxygen Saturation % 93 %     1 Minute Liters of Oxygen 0 L     2 Minute Oxygen Saturation % 95 %     2 Minute Liters of Oxygen 0 L     3 Minute Oxygen Saturation % 94 %     3 Minute Liters of Oxygen 0 L     4 Minute Oxygen Saturation % 91 %     4 Minute Liters of Oxygen 0 L     5 Minute Oxygen Saturation % 91 %     5 Minute Liters of Oxygen 0 L     6 Minute Oxygen Saturation % 88 %     6 Minute Liters of Oxygen 0 L     2 Minute Post Oxygen Saturation % 95 %     2 Minute Post Liters of Oxygen 0 L        Psychological, QOL, Others - Outcomes: PHQ 2/9: Depression screen PHQ 2/9 07/20/2017  Decreased Interest 1  Down, Depressed, Hopeless 0  PHQ - 2 Score 1  Altered sleeping 3  Tired, decreased energy 3  Change in appetite 3  Feeling bad or failure about yourself  0  Trouble concentrating 0  Moving slowly or fidgety/restless 0  Suicidal thoughts 0  PHQ-9 Score 10  Difficult doing work/chores Not difficult at all    Quality of Life:   Personal Goals: Goals established at orientation with interventions provided to work toward goal.     Personal Goals and Risk Factors at Admission - 07/20/17 1143      Core Components/Risk Factors/Patient Goals on Admission    Weight Management Yes   Intervention Weight Management: Develop a combined nutrition and exercise program  designed to reach desired caloric intake, while maintaining appropriate intake of nutrient and fiber, sodium and fats, and appropriate energy expenditure required for the weight goal.;Weight Management: Provide education and appropriate resources to help participant work on and attain dietary goals.;Weight Management/Obesity: Establish reasonable short term and long term weight goals.   Admit Weight 222 lb (100.7 kg)   Goal Weight: Short Term 217 lb (98.4 kg)   Goal Weight: Long Term 175 lb (79.4 kg)   Expected Outcomes Short Term: Continue to assess and modify interventions until short term weight is achieved;Weight Maintenance: Understanding of the daily nutrition guidelines, which includes 25-35% calories from fat, 7% or less cal from saturated fats, less than 200mg  cholesterol, less than 1.5gm of sodium, & 5 or more servings of fruits and vegetables daily;Long Term: Adherence to nutrition and physical activity/exercise program aimed toward attainment of established weight goal;Weight Loss: Understanding of general recommendations for a balanced deficit meal plan, which promotes 1-2 lb weight loss per week and includes a negative energy balance of [phone] kcal/d;Understanding recommendations for meals to include 15-35% energy as protein, 25-35% energy from fat, 35-60% energy from carbohydrates, less than 200mg  of dietary cholesterol, 20-35 gm of total fiber daily;Understanding of distribution of calorie intake throughout the day with the consumption of 4-5 meals/snacks   Hypertension Yes   Intervention Provide education on  lifestyle modifcations including regular physical activity/exercise, weight management, moderate sodium restriction and increased consumption of fresh fruit, vegetables, and low fat dairy, alcohol moderation, and smoking cessation.;Monitor prescription use compliance.   Expected Outcomes Short Term: Continued assessment and intervention until BP is < 140/65mm HG in hypertensive  participants. < 130/45mm HG in hypertensive participants with diabetes, heart failure or chronic kidney disease.;Long Term: Maintenance of blood pressure at goal levels.   Lipids Yes   Intervention Provide education and support for participant on nutrition & aerobic/resistive exercise along with prescribed medications to achieve LDL 70mg , HDL >40mg .   Expected Outcomes Short Term: Participant states understanding of desired cholesterol values and is compliant with medications prescribed. Participant is following exercise prescription and nutrition guidelines.;Long Term: Cholesterol controlled with medications as prescribed, with individualized exercise RX and with personalized nutrition plan. Value goals: LDL < 70mg , HDL > 40 mg.   Stress Yes   Intervention Offer individual and/or small group education and counseling on adjustment to heart disease, stress management and health-related lifestyle change. Teach and support self-help strategies.;Refer participants experiencing significant psychosocial distress to appropriate mental health specialists for further evaluation and treatment. When possible, include family members and significant others in education/counseling sessions.   Expected Outcomes Short Term: Participant demonstrates changes in health-related behavior, relaxation and other stress management skills, ability to obtain effective social support, and compliance with psychotropic medications if prescribed.;Long Term: Emotional wellbeing is indicated by absence of clinically significant psychosocial distress or social isolation.       Personal Goals Discharge:   Exercise Goals and Review:     Exercise Goals    Row Name 07/20/17 1237             Exercise Goals   Increase Physical Activity Yes       Intervention Provide advice, education, support and counseling about physical activity/exercise needs.;Develop an individualized exercise prescription for aerobic and resistive training  based on initial evaluation findings, risk stratification, comorbidities and participant's personal goals.       Expected Outcomes Achievement of increased cardiorespiratory fitness and enhanced flexibility, muscular endurance and strength shown through measurements of functional capacity and personal statement of participant.       Increase Strength and Stamina Yes       Intervention Provide advice, education, support and counseling about physical activity/exercise needs.;Develop an individualized exercise prescription for aerobic and resistive training based on initial evaluation findings, risk stratification, comorbidities and participant's personal goals.       Expected Outcomes Achievement of increased cardiorespiratory fitness and enhanced flexibility, muscular endurance and strength shown through measurements of functional capacity and personal statement of participant.       Able to understand and use rate of perceived exertion (RPE) scale Yes       Intervention Provide education and explanation on how to use RPE scale       Expected Outcomes Short Term: Able to use RPE daily in rehab to express subjective intensity level;Long Term:  Able to use RPE to guide intensity level when exercising independently       Able to understand and use Dyspnea scale Yes       Intervention Provide education and explanation on how to use Dyspnea scale       Expected Outcomes Short Term: Able to use Dyspnea scale daily in rehab to express subjective sense of shortness of breath during exertion;Long Term: Able to use Dyspnea scale to guide intensity level when exercising independently  Knowledge and understanding of Target Heart Rate Range (THRR) Yes       Intervention Provide education and explanation of THRR including how the numbers were predicted and where they are located for reference       Expected Outcomes Short Term: Able to state/look up THRR;Long Term: Able to use THRR to govern intensity when  exercising independently;Short Term: Able to use daily as guideline for intensity in rehab       Able to check pulse independently Yes       Intervention Provide education and demonstration on how to check pulse in carotid and radial arteries.;Review the importance of being able to check your own pulse for safety during independent exercise       Expected Outcomes Short Term: Able to explain why pulse checking is important during independent exercise;Long Term: Able to check pulse independently and accurately       Understanding of Exercise Prescription Yes       Intervention Provide education, explanation, and written materials on patient's individual exercise prescription       Expected Outcomes Short Term: Able to explain program exercise prescription;Long Term: Able to explain home exercise prescription to exercise independently          Nutrition & Weight - Outcomes:     Pre Biometrics - 07/20/17 1236      Pre Biometrics   Height 5\' 10"  (1.778 m)   Weight 221 lb 6.4 oz (100.4 kg)   Waist Circumference 42.5 inches   Hip Circumference 44 inches   Waist to Hip Ratio 0.97 %   BMI (Calculated) 31.77       Nutrition:     Nutrition Therapy & Goals - 07/20/17 1130      Personal Nutrition Goals   Comments patient needs to fill handouts for the dietician. paperwork given to patient.     Intervention Plan   Intervention Prescribe, educate and counsel regarding individualized specific dietary modifications aiming towards targeted core components such as weight, hypertension, lipid management, diabetes, heart failure and other comorbidities.;Nutrition handout(s) given to patient.   Expected Outcomes Short Term Goal: Understand basic principles of dietary content, such as calories, fat, sodium, cholesterol and nutrients.;Short Term Goal: A plan has been developed with personal nutrition goals set during dietitian appointment.;Long Term Goal: Adherence to prescribed nutrition plan.       Nutrition Discharge:   Education Questionnaire Score:     Knowledge Questionnaire Score - 07/20/17 1132      Knowledge Questionnaire Score   Pre Score 5/10      Goals reviewed with patient; copy given to patient.

## 2017-08-19 NOTE — Telephone Encounter (Signed)
Called to see how Jerry Jimenez was doing after his visit to the ED. His wife states that he is doing better and will be going to see Dr. Sabra Heck next week. Patient is being discharged from [facility] due to no attendance and unfortunate circumstances. Informed her that since he has not started the program yet and he had a health status change he will need another referral when he is ready for Pulmonary rehab. She verbalizes understanding of the situation.

## 2017-08-20 ENCOUNTER — Ambulatory Visit: Payer: Medicare Other

## 2017-08-20 ENCOUNTER — Inpatient Hospital Stay
Admission: EM | Admit: 2017-08-20 | Discharge: 2017-08-20 | DRG: 192 | Disposition: A | Discharge status: Home / Self Care | Payer: Medicare Other | Attending: Internal Medicine | Admitting: Internal Medicine

## 2017-08-20 ENCOUNTER — Emergency Department: Payer: Medicare Other

## 2017-08-20 ENCOUNTER — Encounter: Payer: Self-pay | Admitting: Internal Medicine

## 2017-08-20 DIAGNOSIS — Z7984 Long term (current) use of oral hypoglycemic drugs: Secondary | ICD-10-CM | POA: Diagnosis not present

## 2017-08-20 DIAGNOSIS — I110 Hypertensive heart disease with heart failure: Secondary | ICD-10-CM | POA: Diagnosis not present

## 2017-08-20 DIAGNOSIS — Z79891 Long term (current) use of opiate analgesic: Secondary | ICD-10-CM | POA: Diagnosis not present

## 2017-08-20 DIAGNOSIS — Z79899 Other long term (current) drug therapy: Secondary | ICD-10-CM | POA: Diagnosis not present

## 2017-08-20 DIAGNOSIS — Z951 Presence of aortocoronary bypass graft: Secondary | ICD-10-CM | POA: Diagnosis not present

## 2017-08-20 DIAGNOSIS — M549 Dorsalgia, unspecified: Secondary | ICD-10-CM | POA: Diagnosis not present

## 2017-08-20 DIAGNOSIS — R918 Other nonspecific abnormal finding of lung field: Secondary | ICD-10-CM

## 2017-08-20 DIAGNOSIS — J441 Chronic obstructive pulmonary disease with (acute) exacerbation: Secondary | ICD-10-CM | POA: Diagnosis present

## 2017-08-20 DIAGNOSIS — Z7901 Long term (current) use of anticoagulants: Secondary | ICD-10-CM | POA: Diagnosis not present

## 2017-08-20 DIAGNOSIS — I509 Heart failure, unspecified: Secondary | ICD-10-CM | POA: Diagnosis not present

## 2017-08-20 DIAGNOSIS — L03115 Cellulitis of right lower limb: Secondary | ICD-10-CM

## 2017-08-20 DIAGNOSIS — I482 Chronic atrial fibrillation: Secondary | ICD-10-CM | POA: Diagnosis not present

## 2017-08-20 DIAGNOSIS — J189 Pneumonia, unspecified organism: Secondary | ICD-10-CM | POA: Diagnosis present

## 2017-08-20 DIAGNOSIS — Z8249 Family history of ischemic heart disease and other diseases of the circulatory system: Secondary | ICD-10-CM | POA: Diagnosis not present

## 2017-08-20 DIAGNOSIS — S8011XD Contusion of right lower leg, subsequent encounter: Secondary | ICD-10-CM | POA: Diagnosis not present

## 2017-08-20 DIAGNOSIS — Z87891 Personal history of nicotine dependence: Secondary | ICD-10-CM | POA: Diagnosis not present

## 2017-08-20 DIAGNOSIS — Z7982 Long term (current) use of aspirin: Secondary | ICD-10-CM | POA: Diagnosis not present

## 2017-08-20 DIAGNOSIS — I2510 Atherosclerotic heart disease of native coronary artery without angina pectoris: Secondary | ICD-10-CM | POA: Diagnosis not present

## 2017-08-20 DIAGNOSIS — Z825 Family history of asthma and other chronic lower respiratory diseases: Secondary | ICD-10-CM | POA: Diagnosis not present

## 2017-08-20 HISTORY — DX: Atherosclerotic heart disease of native coronary artery without angina pectoris: I25.10

## 2017-08-20 LAB — CBC
HCT: 42.1 % (ref 40.0–52.0)
Hemoglobin: 13.9 g/dL (ref 13.0–18.0)
MCH: 28 pg (ref 26.0–34.0)
MCHC: 33 g/dL (ref 32.0–36.0)
MCV: 85.1 fL (ref 80.0–100.0)
PLATELETS: 267 10*3/uL (ref 150–440)
RBC: 4.95 MIL/uL (ref 4.40–5.90)
RDW: 16 % — AB (ref 11.5–14.5)
WBC: 8 10*3/uL (ref 3.8–10.6)

## 2017-08-20 LAB — BASIC METABOLIC PANEL
Anion gap: 6 (ref 5–15)
BUN: 15 mg/dL (ref 6–20)
CHLORIDE: 105 mmol/L (ref 101–111)
CO2: 28 mmol/L (ref 22–32)
CREATININE: 0.84 mg/dL (ref 0.61–1.24)
Calcium: 8.5 mg/dL — ABNORMAL LOW (ref 8.9–10.3)
GFR calc Af Amer: >60 mL/min (ref >60)
GFR calc non Af Amer: >60 mL/min (ref >60)
GLUCOSE: 138 mg/dL — AB (ref 65–99)
POTASSIUM: 4.3 mmol/L (ref 3.5–5.1)
Sodium: 139 mmol/L (ref 135–145)

## 2017-08-20 MED ORDER — SODIUM CHLORIDE 0.9% FLUSH: 3.0000 mL | Freq: Two times a day (BID) | INTRAVENOUS | Status: DC

## 2017-08-20 MED ORDER — ACETAMINOPHEN 325 MG PO TABS
ORAL_TABLET | ORAL | Status: AC
  Administered 2017-08-20: 650 mg via ORAL
  Filled 2017-08-20: qty 2

## 2017-08-20 MED ORDER — ENOXAPARIN SODIUM 40 MG/0.4ML ~~LOC~~ SOLN
40.0000 mg | SUBCUTANEOUS | Status: DC
  Filled 2017-08-20: qty 0.4

## 2017-08-20 MED ORDER — ELIQUIS 5 MG PO TABS: 5.0000 mg | ORAL_TABLET | Freq: Two times a day (BID) | ORAL | 2 refills | Status: AC

## 2017-08-20 MED ORDER — ONDANSETRON HCL 4 MG/2ML IJ SOLN: 4.0000 mg | Freq: Four times a day (QID) | INTRAMUSCULAR | Status: DC | PRN

## 2017-08-20 MED ORDER — METHYLPREDNISOLONE SODIUM SUCC 125 MG IJ SOLR
60.0000 mg | Freq: Four times a day (QID) | INTRAMUSCULAR | Status: DC
  Administered 2017-08-20: 125 mg via INTRAVENOUS

## 2017-08-20 MED ORDER — CEPHALEXIN 500 MG PO CAPS: 500.0000 mg | ORAL_CAPSULE | Freq: Two times a day (BID) | ORAL | 0 refills | Status: DC

## 2017-08-20 MED ORDER — CEPHALEXIN 500 MG PO CAPS
500.0000 mg | ORAL_CAPSULE | Freq: Two times a day (BID) | ORAL | Status: DC
Start: 2017-08-20 — End: 2017-08-20
  Administered 2017-08-20: 500 mg via ORAL
  Filled 2017-08-20: qty 1

## 2017-08-20 MED ORDER — TRAMADOL HCL 50 MG PO TABS
100.0000 mg | ORAL_TABLET | Freq: Once | ORAL | Status: AC
  Administered 2017-08-20: 100 mg via ORAL
  Filled 2017-08-20: qty 2

## 2017-08-20 MED ORDER — IPRATROPIUM-ALBUTEROL 0.5-2.5 (3) MG/3ML IN SOLN
RESPIRATORY_TRACT | Status: AC
  Administered 2017-08-20: 3 mL via RESPIRATORY_TRACT
  Filled 2017-08-20: qty 3

## 2017-08-20 MED ORDER — ONDANSETRON HCL 4 MG PO TABS: 4.0000 mg | ORAL_TABLET | Freq: Four times a day (QID) | ORAL | Status: DC | PRN

## 2017-08-20 MED ORDER — SODIUM CHLORIDE 0.9 % IV SOLN: 250.0000 mL | INTRAVENOUS | Status: DC | PRN

## 2017-08-20 MED ORDER — PREDNISONE 10 MG PO TABS: ORAL_TABLET | ORAL | 0 refills | Status: DC

## 2017-08-20 MED ORDER — IOPAMIDOL (ISOVUE-370) INJECTION 76%
75.0000 mL | Freq: Once | INTRAVENOUS | Status: AC | PRN
  Administered 2017-08-20: 75 mL via INTRAVENOUS

## 2017-08-20 MED ORDER — DEXTROSE 5 % IV SOLN
500.0000 mg | Freq: Once | INTRAVENOUS | Status: AC
  Administered 2017-08-20: 500 mg via INTRAVENOUS
  Filled 2017-08-20: qty 500

## 2017-08-20 MED ORDER — IPRATROPIUM-ALBUTEROL 0.5-2.5 (3) MG/3ML IN SOLN: 3.0000 mL | Freq: Four times a day (QID) | RESPIRATORY_TRACT | 1 refills | Status: AC | PRN

## 2017-08-20 MED ORDER — ALBUTEROL SULFATE (2.5 MG/3ML) 0.083% IN NEBU: 2.5000 mg | INHALATION_SOLUTION | Freq: Once | RESPIRATORY_TRACT | Status: DC

## 2017-08-20 MED ORDER — IPRATROPIUM-ALBUTEROL 0.5-2.5 (3) MG/3ML IN SOLN
3.0000 mL | Freq: Once | RESPIRATORY_TRACT | Status: AC
Start: 2017-08-20 — End: 2017-08-20
  Administered 2017-08-20: 3 mL via RESPIRATORY_TRACT

## 2017-08-20 MED ORDER — DEXTROSE 5 % IV SOLN: 500.0000 mg | INTRAVENOUS | Status: DC

## 2017-08-20 MED ORDER — SODIUM CHLORIDE 0.9% FLUSH: 3.0000 mL | INTRAVENOUS | Status: DC | PRN

## 2017-08-20 MED ORDER — CEFTRIAXONE SODIUM IN DEXTROSE 20 MG/ML IV SOLN
1.0000 g | INTRAVENOUS | Status: DC
  Filled 2017-08-20: qty 50

## 2017-08-20 MED ORDER — PNEUMOCOCCAL VAC POLYVALENT 25 MCG/0.5ML IJ INJ: 0.5000 mL | INJECTION | INTRAMUSCULAR | Status: DC

## 2017-08-20 MED ORDER — IPRATROPIUM-ALBUTEROL 0.5-2.5 (3) MG/3ML IN SOLN
3.0000 mL | Freq: Four times a day (QID) | RESPIRATORY_TRACT | Status: DC
  Administered 2017-08-20 (×2): 3 mL via RESPIRATORY_TRACT
  Filled 2017-08-20 (×2): qty 3

## 2017-08-20 MED ORDER — METHYLPREDNISOLONE SODIUM SUCC 125 MG IJ SOLR
INTRAMUSCULAR | Status: AC
  Administered 2017-08-20: 125 mg via INTRAVENOUS
  Filled 2017-08-20: qty 2

## 2017-08-20 MED ORDER — PREDNISONE 50 MG PO TABS
50.0000 mg | ORAL_TABLET | Freq: Every day | ORAL | Status: DC
  Administered 2017-08-20: 50 mg via ORAL
  Filled 2017-08-20 (×2): qty 1

## 2017-08-20 MED ORDER — CEFTRIAXONE SODIUM IN DEXTROSE 20 MG/ML IV SOLN
1.0000 g | Freq: Once | INTRAVENOUS | Status: AC
  Administered 2017-08-20: 1 g via INTRAVENOUS
  Filled 2017-08-20: qty 50

## 2017-08-20 MED ORDER — ACETAMINOPHEN 650 MG RE SUPP: 650.0000 mg | Freq: Four times a day (QID) | RECTAL | Status: DC | PRN

## 2017-08-20 MED ORDER — SENNOSIDES-DOCUSATE SODIUM 8.6-50 MG PO TABS: 1.0000 | ORAL_TABLET | Freq: Every evening | ORAL | Status: DC | PRN

## 2017-08-20 MED ORDER — ACETAMINOPHEN 325 MG PO TABS
650.0000 mg | ORAL_TABLET | Freq: Four times a day (QID) | ORAL | Status: DC | PRN
Start: 2017-08-20 — End: 2017-08-20
  Administered 2017-08-20: 650 mg via ORAL

## 2017-08-20 NOTE — ED Notes (Signed)
Patient transported to CT 

## 2017-08-20 NOTE — Discharge Summary (Signed)
[facility] NAME: Jerry Jimenez    MR#:  [id number]  DATE OF BIRTH:  03-Aug-1934  DATE OF ADMISSION:  08/20/2017 ADMITTING PHYSICIAN: Saundra Shelling, MD  DATE OF DISCHARGE: 08/22/17  PRIMARY CARE PHYSICIAN: Rusty Aus, MD    ADMISSION DIAGNOSIS:  sob  DISCHARGE DIAGNOSIS:  Acute on chronic COPD exacerbation--- improved Right tibial shin hematoma secondary to injury 4 weeks ago  SECONDARY DIAGNOSIS:   Past Medical History:  Diagnosis Date  . CAD (coronary artery disease)   . CHF (congestive heart failure) ([place])   . COPD (chronic obstructive pulmonary disease) ([place])   . Hypertension     HOSPITAL COURSE:  Jerry Jimenez  is a 83 y.o. male with a known history of coronary artery disease, congestive heart failure, COPD, hypertension presented to the emergency room with increased shortness of breath for the last 1 week. Patient has no fever and chills. No history of any cough  1.  Acute on chronic COPD exacerbation -Patient was admitted and received IV Solu-Medrol in the emergency room, nebulizer treatment and oral inhalers were initiated Chest CT did not show evidence of pneumonia.- -Antibiotics were de-escalated to p.o. Keflex Patient is-going to be set up with nebulizer at home -Room air sats on exertion were 95% -P.o. steroid taper  2.  Known history of 3.7 cm focus of hazy opacity at the medial aspect of the right upper lobe. Wife is aware of it and so is patient.  The follow-up with  [facility].  3.  Right tibial shin hematoma 4 weeks ago--- No evidence of cellulitis.  White count is normal -Eliquis on hold per primary care physician's recommendation.  Hemoglobin is stable. -Patient will call PCP and see if Eliquis can be resumed.  He takes that for anticoagulation due to chronic A. Fib  4.HTN   Overall hemodynamically stable appears at baseline.  Patient will discharge home.  Patient and wife are  agreeable. CONSULTS OBTAINED:    DRUG ALLERGIES:   Allergies  Allergen Reactions  . Ambien [Zolpidem Tartrate]   . Ciprofloxacin     DISCHARGE MEDICATIONS:   Current Discharge Medication List    START taking these medications   Details  cephALEXin (KEFLEX) 500 MG capsule Take 1 capsule (500 mg total) by mouth every 12 (twelve) hours. Qty: 10 capsule, Refills: 0    ipratropium-albuterol (DUONEB) 0.5-2.5 (3) MG/3ML SOLN Take 3 mLs by nebulization every 6 (six) hours as needed. Qty: 360 mL, Refills: 1    predniSONE (DELTASONE) 10 MG tablet Take 50 mg daily.  Taper by 10 mg daily then stop. Qty: 15 tablet, Refills: 0      CONTINUE these medications which have CHANGED   Details  ELIQUIS 5 MG TABS tablet Take 1 tablet (5 mg total) by mouth 2 (two) times daily. Patient advised to discuss with primary care physician before starting Eliquis.  It has been on hold for 4 weeks Qty: 60 tablet, Refills: 2      CONTINUE these medications which have NOT CHANGED   Details  aspirin EC 81 MG tablet Take 81 mg by mouth daily.    cholecalciferol (VITAMIN D) 1000 units tablet Take 1,000 Units by mouth daily.    cyanocobalamin 500 MCG tablet Take 500 mcg by mouth daily.    glimepiride (AMARYL) 2 MG tablet Take 1 tablet by mouth daily. Refills: 10    hydrochlorothiazide (HYDRODIURIL) 25 MG tablet Take 25 mg by mouth  daily.    magnesium oxide (MAG-OX) 400 MG tablet Take 400 mg by mouth daily.    Melatonin 5 MG TABS Take 1 tablet by mouth at bedtime.    montelukast (SINGULAIR) 10 MG tablet Take 1 tablet by mouth every evening.  Refills: 2    multivitamin-lutein (OCUVITE-LUTEIN) CAPS capsule Take 1 capsule by mouth daily.    omeprazole (PRILOSEC) 20 MG capsule Take 1 capsule by mouth daily. Refills: 0    pramipexole (MIRAPEX) 0.5 MG tablet Take 1 tablet by mouth 2 (two) times daily. Refills: 0    PROAIR HFA 108 (90 Base) MCG/ACT inhaler Inhale 2 puffs into the lungs every 6 (six)  hours as needed. Refills: 11    simvastatin (ZOCOR) 20 MG tablet Take 1 tablet by mouth daily at 6 PM.  Refills: 2    STIOLTO RESPIMAT 2.5-2.5 MCG/ACT AERS Inhale 2 puffs into the lungs every morning.  Refills: 10    traMADol (ULTRAM) 50 MG tablet 100 mg every 6 (six) hours as needed.  Refills: 0    temazepam (RESTORIL) 30 MG capsule Take 1 capsule by mouth daily. Refills: 0        If you experience worsening of your admission symptoms, develop shortness of breath, life threatening emergency, suicidal or homicidal thoughts you must seek medical attention immediately by calling 911 or calling your MD immediately  if symptoms less severe.  You Must read complete instructions/literature along with all the possible adverse reactions/side effects for all the Medicines you take and that have been prescribed to you. Take any new Medicines after you have completely understood and accept all the possible adverse reactions/side effects.   Please note  You were cared for by a hospitalist during your hospital stay. If you have any questions about your discharge medications or the care you received while you were in the hospital after you are discharged, you can call the unit and asked to speak with the hospitalist on call if the hospitalist that took care of you is not available. Once you are discharged, your primary care physician will handle any further medical issues. Please note that NO REFILLS for any discharge medications will be authorized once you are discharged, as it is imperative that you return to your primary care physician (or establish a relationship with a primary care physician if you do not have one) for your aftercare needs so that they can reassess your need for medications and monitor your lab values. Today   SUBJECTIVE   Breathing much improved  VITAL SIGNS:  Blood pressure (!) 156/59, pulse 74, temperature 98.6 F (37 C), temperature source Oral, resp. rate 18, height 5'  11" (1.803 m), weight 97.5 kg (214 lb 14.4 oz), SpO2 93 %.  I/O:   Intake/Output Summary (Last 24 hours) at 08/20/17 1333 Last data filed at 08/20/17 1025  Gross per 24 hour  Intake              480 ml  Output              500 ml  Net              -20 ml    PHYSICAL EXAMINATION:  GENERAL:  83 y.o.-year-old patient lying in the bed with no acute distress.  EYES: Pupils equal, round, reactive to light and accommodation. No scleral icterus. Extraocular muscles intact.  HEENT: Head atraumatic, normocephalic. Oropharynx and nasopharynx clear.  NECK:  Supple, no jugular venous distention. No thyroid enlargement, no  tenderness.  LUNGS: Normal breath sounds bilaterally, no wheezing, rales,rhonchi or crepitation. No use of accessory muscles of respiration.  CARDIOVASCULAR: S1, S2 normal. No murmurs, rubs, or gallops.  ABDOMEN: Soft, non-tender, non-distended. Bowel sounds present. No organomegaly or mass.  EXTREMITIES: No pedal edema, cyanosis, or clubbing.  NEUROLOGIC: Cranial nerves II through XII are intact. Muscle strength 5/5 in all extremities. Sensation intact. Gait not checked.  PSYCHIATRIC: The patient is alert and oriented x 3.  SKIN: No obvious rash, lesion, or ulcer.   DATA REVIEW:   CBC   Recent Labs Lab 08/20/17 0427  WBC 8.0  HGB 13.9  HCT 42.1  PLT 267    Chemistries   Recent Labs Lab 08/20/17 0427  NA 139  K 4.3  CL 105  CO2 28  GLUCOSE 138*  BUN 15  CREATININE 0.84  CALCIUM 8.5*    Microbiology Results   No results found for this or any previous visit (from the past 240 hour(s)).  RADIOLOGY:  Dg Chest 2 View  Result Date: 08/19/2017 CLINICAL DATA:  Acute onset of shortness of breath. Initial encounter. EXAM: CHEST  2 VIEW COMPARISON:  Chest radiograph performed 08/12/2017 FINDINGS: The lungs are well-aerated. Vascular congestion is noted. Mild bibasilar atelectasis is noted. There is no evidence of pleural effusion or pneumothorax. The heart is  normal in size; the patient is status post median sternotomy. No acute osseous abnormalities are seen. Mild chronic compression deformities are noted at the lower thoracic and upper lumbar spine. IMPRESSION: 1. Vascular congestion.  Mild bibasilar atelectasis noted. 2. Mild chronic compression deformities at the lower thoracic and upper lumbar spine. Electronically Signed   By: Garald Balding M.D.   On: 08/19/2017 22:43   Ct Angio Chest Pe W And/or Wo Contrast  Result Date: 08/20/2017 CLINICAL DATA:  Acute onset of shortness of breath. Initial encounter. EXAM: CT ANGIOGRAPHY CHEST WITH CONTRAST TECHNIQUE: Multidetector CT imaging of the chest was performed using the standard protocol during bolus administration of intravenous contrast. Multiplanar CT image reconstructions and MIPs were obtained to evaluate the vascular anatomy. CONTRAST:  75 mL of Isovue 370 IV contrast COMPARISON:  Chest radiograph performed 08/19/2017 FINDINGS: Cardiovascular:  There is no evidence of pulmonary embolus. Diffuse coronary artery calcifications are seen. The heart remains normal in size. Mild calcification is noted at about the aortic arch and proximal great vessels. The patient is status post median sternotomy. Mediastinum/Nodes: The mediastinum is otherwise unremarkable. No mediastinal lymphadenopathy is seen. No pericardial effusion is identified. A 4.0 cm mildly heterogeneous nodule with calcification is noted at the right thyroid lobe, with an adjacent smaller hypodense lesion. No axillary lymphadenopathy is appreciated. Lungs/Pleura: There is a 3.7 cm focus of hazy opacity at the medial aspect of the right upper lobe, with associated architectural distortion. Malignancy cannot be excluded, though this could also reflect chronic infection or scarring. Bilateral emphysema is noted. No pleural effusion or pneumothorax is seen. Scarring is noted at the lung apices. Upper Abdomen: The visualized portions of the liver and spleen  are unremarkable. The visualized portions of the pancreas see are within normal limits. A 1.4 cm right adrenal adenoma is noted. A large right renal cyst is partially imaged. Musculoskeletal: No acute osseous abnormalities are identified. Chronic compression deformities are noted at vertebral bodies T11 and T12, with associated vacuum phenomenon. The visualized musculature is unremarkable in appearance. Review of the MIP images confirms the above findings. IMPRESSION: 1. No evidence of pulmonary embolus. 2. 3.7 cm focus  of hazy opacity at the medial aspect of the right upper lung lobe, with associated architectural distortion. Malignancy cannot be excluded, though this could also reflect chronic infection or scarring. Would correlate with the patient's symptoms, and consider PET/CT for further evaluation, particularly if the patient does not have symptoms of pneumonia. 3. Bilateral emphysema noted. 4. Diffuse coronary artery calcifications noted. 5. **An incidental finding of potential clinical significance has been found. 4.0 cm mildly heterogeneous nodule with calcification at the right thyroid lobe, with adjacent smaller hypodense lesion. Recommend further evaluation with thyroid ultrasound, as deemed clinically appropriate. If patient is clinically hyperthyroid, consider nuclear medicine thyroid uptake and scan.** 6. Large right renal cyst. 7. Chronic compression deformities of vertebral bodies T11 and T12. Electronically Signed   By: Garald Balding M.D.   On: 08/20/2017 02:14     Management plans discussed with the patient, family and they are in agreement.  CODE STATUS:     Code Status Orders        Start     Ordered   08/20/17 0420  Full code  Continuous     08/20/17 0419    Code Status History    Date Active Date Inactive Code Status Order ID Comments User Context   This patient has a current code status but no historical code status.      TOTAL TIME TAKING CARE OF THIS PATIENT: *40*  minutes.    Rhiley Tarver M.D on 08/20/2017 at 1:33 PM  Between 7am to 6pm - Pager - [phone] After 6pm go to [email or web address] - password [email or web address] [facility]  Office  [phone]  CC: Primary care physician; Rusty Aus, MD

## 2017-08-20 NOTE — Care Management (Signed)
Presents from home with shortness of breath.  Admitted with diagnosis of  pneumonia, copd exacerbation and right leg cellulitis.Patient is not requiring supplemental oxygen.  He is independent in his adls and no issues accessing medical medical care, transportation or  paying for medications

## 2017-08-20 NOTE — H&P (Signed)
[facility] NAME: Jerry Jimenez    MR#:  [id number]  DATE OF BIRTH:  Feb 11, 1934  DATE OF ADMISSION:  08/20/2017  PRIMARY CARE PHYSICIAN: Rusty Aus, MD   REQUESTING/REFERRING PHYSICIAN:   CHIEF COMPLAINT:   Chief Complaint  Patient presents with  . Shortness of Breath    HISTORY OF PRESENT ILLNESS: Jerry Jimenez  is a 83 y.o. male with a known history of coronary artery disease, congestive heart failure, COPD, hypertension presented to the emergency room with increased shortness of breath for the last 1 week. Patient has no fever and chills. No history of any cough. No shortness of breath worsened and her breathing treatments did not help. He presented to the emergency room. He was evaluated with CT angiogram of the chest which showed no pulmonary embolism. 3.7 cm hazy opacity noted in the right upper lobe of the lung which could represent infectious process versus malignancy. Patient quit smoking more than 30 years ago. Patient also has some redness and swelling in the right lower extremity. Patient was given IV antibiotics in the emergency room and hospitalist service was consulted for a community-acquired pneumonia, right lung mass and right lower extremity cellulitis.  PAST MEDICAL HISTORY:   Past Medical History:  Diagnosis Date  . CAD (coronary artery disease)   . CHF (congestive heart failure) ([place])   . COPD (chronic obstructive pulmonary disease) ([place])   . Hypertension     PAST SURGICAL HISTORY: Past Surgical History:  Procedure Laterality Date  . CORONARY ARTERY BYPASS GRAFT      SOCIAL HISTORY:  Social History  Substance Use Topics  . Smoking status: Former Smoker    Packs/day: 3.50    Years: 35.00    Types: Cigarettes    Quit date: 07/06/1987  . Smokeless tobacco: Former Systems developer    Types: Chew    Quit date: 07/05/2002  . Alcohol use No    FAMILY HISTORY:  Family History  Problem Relation Age of  Onset  . Asthma Mother   . Heart disease Father     DRUG ALLERGIES: No Known Allergies  REVIEW OF SYSTEMS:   CONSTITUTIONAL: No fever, fatigue or weakness.  EYES: No blurred or double vision.  EARS, NOSE, AND THROAT: No tinnitus or ear pain.  RESPIRATORY: No cough,has shortness of breath, wheezing  No hemoptysis.  CARDIOVASCULAR: No chest pain, orthopnea, edema.  GASTROINTESTINAL: No nausea, vomiting, diarrhea or abdominal pain.  GENITOURINARY: No dysuria, hematuria.  ENDOCRINE: No polyuria, nocturia,  HEMATOLOGY: No anemia, easy bruising or bleeding SKIN: redness of skin right leg. MUSCULOSKELETAL: No joint pain or arthritis.   NEUROLOGIC: No tingling, numbness, weakness.  PSYCHIATRY: No anxiety or depression.   MEDICATIONS AT HOME:  Prior to Admission medications   Not on File      PHYSICAL EXAMINATION:   VITAL SIGNS: Blood pressure (!) 168/58, pulse 77, temperature 98 F (36.7 C), temperature source Oral, resp. rate 17, height 5\' 11"  (1.803 m), weight 99.8 kg (220 lb), SpO2 97 %.  GENERAL:  83 y.o.-year-old patient lying in the bed with no acute distress.  EYES: Pupils equal, round, reactive to light and accommodation. No scleral icterus. Extraocular muscles intact.  HEENT: Head atraumatic, normocephalic. Oropharynx and nasopharynx clear.  NECK:  Supple, no jugular venous distention. No thyroid enlargement, no tenderness.  LUNGS: Decreased breath sounds bilaterally, bilateral wheezing noted. No use of accessory muscles of respiration.  CARDIOVASCULAR: S1, S2 normal. No murmurs,  rubs, or gallops.  ABDOMEN: Soft, nontender, nondistended. Bowel sounds present. No organomegaly or mass.  EXTREMITIES: No pedal edema, cyanosis, or clubbing.  Right leg redness noted with swelling of right leg and around right knee NEUROLOGIC: Cranial nerves II through XII are intact. Muscle strength 5/5 in all extremities. Sensation intact. Gait not checked.  PSYCHIATRIC: The patient is alert  and oriented x 3.  SKIN: redness of skin right leg with peeling of skin 3/3 cm wound in right knee area.  LABORATORY PANEL:   CBC  Recent Labs Lab 08/19/17 2125  WBC 8.0  HGB 14.1  HCT 43.1  PLT 286  MCV 85.4  MCH 27.9  MCHC 32.6  RDW 16.1*   ------------------------------------------------------------------------------------------------------------------  Chemistries   Recent Labs Lab 08/19/17 2125  NA 136  K 4.2  CL 104  CO2 24  GLUCOSE 192*  BUN 20  CREATININE 0.93  CALCIUM 8.5*   ------------------------------------------------------------------------------------------------------------------ estimated creatinine clearance is 75 mL/min (by C-G formula based on SCr of 0.93 mg/dL). ------------------------------------------------------------------------------------------------------------------ No results for input(s): TSH, T4TOTAL, T3FREE, THYROIDAB in the last 72 hours.  Invalid input(s): FREET3   Coagulation profile No results for input(s): INR, PROTIME in the last 168 hours. ------------------------------------------------------------------------------------------------------------------- No results for input(s): DDIMER in the last 72 hours. -------------------------------------------------------------------------------------------------------------------  Cardiac Enzymes  Recent Labs Lab 08/19/17 2125  TROPONINI <0.03   ------------------------------------------------------------------------------------------------------------------ Invalid input(s): POCBNP  ---------------------------------------------------------------------------------------------------------------  Urinalysis No results found for: COLORURINE, APPEARANCEUR, LABSPEC, PHURINE, GLUCOSEU, HGBUR, BILIRUBINUR, KETONESUR, PROTEINUR, UROBILINOGEN, NITRITE, LEUKOCYTESUR   RADIOLOGY: Dg Chest 2 View  Result Date: 08/19/2017 CLINICAL DATA:  Acute onset of shortness of breath.  Initial encounter. EXAM: CHEST  2 VIEW COMPARISON:  Chest radiograph performed 08/12/2017 FINDINGS: The lungs are well-aerated. Vascular congestion is noted. Mild bibasilar atelectasis is noted. There is no evidence of pleural effusion or pneumothorax. The heart is normal in size; the patient is status post median sternotomy. No acute osseous abnormalities are seen. Mild chronic compression deformities are noted at the lower thoracic and upper lumbar spine. IMPRESSION: 1. Vascular congestion.  Mild bibasilar atelectasis noted. 2. Mild chronic compression deformities at the lower thoracic and upper lumbar spine. Electronically Signed   By: Garald Balding M.D.   On: 08/19/2017 22:43   Ct Angio Chest Pe W And/or Wo Contrast  Result Date: 08/20/2017 CLINICAL DATA:  Acute onset of shortness of breath. Initial encounter. EXAM: CT ANGIOGRAPHY CHEST WITH CONTRAST TECHNIQUE: Multidetector CT imaging of the chest was performed using the standard protocol during bolus administration of intravenous contrast. Multiplanar CT image reconstructions and MIPs were obtained to evaluate the vascular anatomy. CONTRAST:  75 mL of Isovue 370 IV contrast COMPARISON:  Chest radiograph performed 08/19/2017 FINDINGS: Cardiovascular:  There is no evidence of pulmonary embolus. Diffuse coronary artery calcifications are seen. The heart remains normal in size. Mild calcification is noted at about the aortic arch and proximal great vessels. The patient is status post median sternotomy. Mediastinum/Nodes: The mediastinum is otherwise unremarkable. No mediastinal lymphadenopathy is seen. No pericardial effusion is identified. A 4.0 cm mildly heterogeneous nodule with calcification is noted at the right thyroid lobe, with an adjacent smaller hypodense lesion. No axillary lymphadenopathy is appreciated. Lungs/Pleura: There is a 3.7 cm focus of hazy opacity at the medial aspect of the right upper lobe, with associated architectural distortion.  Malignancy cannot be excluded, though this could also reflect chronic infection or scarring. Bilateral emphysema is noted. No pleural effusion or pneumothorax is seen. Scarring is noted at the lung  apices. Upper Abdomen: The visualized portions of the liver and spleen are unremarkable. The visualized portions of the pancreas see are within normal limits. A 1.4 cm right adrenal adenoma is noted. A large right renal cyst is partially imaged. Musculoskeletal: No acute osseous abnormalities are identified. Chronic compression deformities are noted at vertebral bodies T11 and T12, with associated vacuum phenomenon. The visualized musculature is unremarkable in appearance. Review of the MIP images confirms the above findings. IMPRESSION: 1. No evidence of pulmonary embolus. 2. 3.7 cm focus of hazy opacity at the medial aspect of the right upper lung lobe, with associated architectural distortion. Malignancy cannot be excluded, though this could also reflect chronic infection or scarring. Would correlate with the patient's symptoms, and consider PET/CT for further evaluation, particularly if the patient does not have symptoms of pneumonia. 3. Bilateral emphysema noted. 4. Diffuse coronary artery calcifications noted. 5. **An incidental finding of potential clinical significance has been found. 4.0 cm mildly heterogeneous nodule with calcification at the right thyroid lobe, with adjacent smaller hypodense lesion. Recommend further evaluation with thyroid ultrasound, as deemed clinically appropriate. If patient is clinically hyperthyroid, consider nuclear medicine thyroid uptake and scan.** 6. Large right renal cyst. 7. Chronic compression deformities of vertebral bodies T11 and T12. Electronically Signed   By: Garald Balding M.D.   On: 08/20/2017 02:14    EKG: Orders placed or performed during the hospital encounter of 08/20/17  . ED EKG  . ED EKG    IMPRESSION AND PLAN: 83 year old elderly male patient with  history of emphysema, coronary artery disease, CABG, hypertension presented to the emergency room with shortness of breath and right lower extremity redness and tenderness.  Admitting diagnosis 1. Community-acquired pneumonia 2. Acute COPD exacerbation 3. Right leg cellulitis 4. Coronary artery disease 5. Right lung mass Treatment plan Admit patient to medical floor Start patient on IV Rocephin and IV Zithromax antibiotic Breathing treatments around the clock Further evaluation of the right lung to rule out malignancy IV Solu-Medrol 125 MG every 6 hourly  All the records are reviewed and case discussed with ED provider. Management plans discussed with the patient, family and they are in agreement.  CODE STATUS:FULL CODE Code Status History    This patient does not have a recorded code status. Please follow your organizational policy for patients in this situation.       TOTAL TIME TAKING CARE OF THIS PATIENT: 50 minutes.    Saundra Shelling M.D on 08/20/2017 at 3:18 AM  Between 7am to 6pm - Pager - [phone]  After 6pm go to [email or web address] - password [email or web address] [facility]  Office  [phone]  CC: Primary care physician; Rusty Aus, MD

## 2017-08-20 NOTE — Progress Notes (Signed)
O2 sats while ambulating. 95%.  No oxygen delivered

## 2017-08-20 NOTE — ED Notes (Signed)
Pt transported to room 242

## 2017-08-20 NOTE — Progress Notes (Signed)
Discharged to home with wife.  Prescriptions for keflex and prednisone taper.  Educated on both drugs
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
## 2017-08-20 NOTE — Plan of Care (Signed)
Problem: Health Behavior/Discharge Planning: Goal: Ability to manage health-related needs will improve Outcome: Completed/Met Date Met: 08/20/17 Discharge instruction, follow up and med changes

## 2017-08-20 NOTE — ED Notes (Signed)
Called pharmacy to reschedule medication
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
## 2017-08-23 ENCOUNTER — Ambulatory Visit: Payer: Medicare Other

## 2017-08-25 ENCOUNTER — Ambulatory Visit: Payer: Medicare Other

## 2017-08-27 ENCOUNTER — Ambulatory Visit: Payer: Medicare Other

## 2017-08-28 ENCOUNTER — Emergency Department: Payer: Medicare Other

## 2017-08-28 ENCOUNTER — Encounter: Payer: Self-pay | Admitting: Emergency Medicine

## 2017-08-28 ENCOUNTER — Emergency Department
Admission: EM | Admit: 2017-08-28 | Discharge: 2017-08-29 | Disposition: A | Discharge status: Home / Self Care | Payer: Medicare Other | Attending: Emergency Medicine | Admitting: Emergency Medicine

## 2017-08-28 DIAGNOSIS — J449 Chronic obstructive pulmonary disease, unspecified: Secondary | ICD-10-CM | POA: Diagnosis not present

## 2017-08-28 DIAGNOSIS — Z87891 Personal history of nicotine dependence: Secondary | ICD-10-CM | POA: Insufficient documentation

## 2017-08-28 DIAGNOSIS — Y939 Activity, unspecified: Secondary | ICD-10-CM | POA: Diagnosis not present

## 2017-08-28 DIAGNOSIS — X58XXXA Exposure to other specified factors, initial encounter: Secondary | ICD-10-CM | POA: Insufficient documentation

## 2017-08-28 DIAGNOSIS — I10 Essential (primary) hypertension: Secondary | ICD-10-CM | POA: Diagnosis not present

## 2017-08-28 DIAGNOSIS — I2510 Atherosclerotic heart disease of native coronary artery without angina pectoris: Secondary | ICD-10-CM | POA: Diagnosis not present

## 2017-08-28 DIAGNOSIS — Z79899 Other long term (current) drug therapy: Secondary | ICD-10-CM | POA: Diagnosis not present

## 2017-08-28 DIAGNOSIS — T148XXA Other injury of unspecified body region, initial encounter: Secondary | ICD-10-CM

## 2017-08-28 DIAGNOSIS — R0602 Shortness of breath: Secondary | ICD-10-CM

## 2017-08-28 DIAGNOSIS — F419 Anxiety disorder, unspecified: Secondary | ICD-10-CM | POA: Diagnosis not present

## 2017-08-28 DIAGNOSIS — Z7901 Long term (current) use of anticoagulants: Secondary | ICD-10-CM | POA: Insufficient documentation

## 2017-08-28 DIAGNOSIS — S8011XA Contusion of right lower leg, initial encounter: Secondary | ICD-10-CM | POA: Insufficient documentation

## 2017-08-28 DIAGNOSIS — Y929 Unspecified place or not applicable: Secondary | ICD-10-CM | POA: Insufficient documentation

## 2017-08-28 DIAGNOSIS — R079 Chest pain, unspecified: Secondary | ICD-10-CM | POA: Diagnosis present

## 2017-08-28 DIAGNOSIS — Y999 Unspecified external cause status: Secondary | ICD-10-CM | POA: Diagnosis not present

## 2017-08-28 LAB — CBC
HEMATOCRIT: 44 % (ref 40.0–52.0)
HEMOGLOBIN: 14.4 g/dL (ref 13.0–18.0)
MCH: 28.1 pg (ref 26.0–34.0)
MCHC: 32.6 g/dL (ref 32.0–36.0)
MCV: 86 fL (ref 80.0–100.0)
PLATELETS: 193 10*3/uL (ref 150–440)
RBC: 5.11 MIL/uL (ref 4.40–5.90)
RDW: 16.5 % — ABNORMAL HIGH (ref 11.5–14.5)
WBC: 8.9 10*3/uL (ref 3.8–10.6)

## 2017-08-28 LAB — BASIC METABOLIC PANEL
Anion gap: 10 (ref 5–15)
BUN: 22 mg/dL — ABNORMAL HIGH (ref 6–20)
CHLORIDE: 105 mmol/L (ref 101–111)
CO2: 21 mmol/L — AB (ref 22–32)
CREATININE: 1.04 mg/dL (ref 0.61–1.24)
Calcium: 8.3 mg/dL — ABNORMAL LOW (ref 8.9–10.3)
GFR calc non Af Amer: >60 mL/min (ref >60)
Glucose, Bld: 150 mg/dL — ABNORMAL HIGH (ref 65–99)
POTASSIUM: 4.2 mmol/L (ref 3.5–5.1)
Sodium: 136 mmol/L (ref 135–145)

## 2017-08-28 LAB — BRAIN NATRIURETIC PEPTIDE: B NATRIURETIC PEPTIDE 5: 84 pg/mL (ref 0.0–100.0)

## 2017-08-28 LAB — TROPONIN I: Troponin I: <0.03 ng/mL (ref <0.03)

## 2017-08-28 MED ORDER — FENTANYL CITRATE (PF) 100 MCG/2ML IJ SOLN
25.0000 ug | Freq: Once | INTRAMUSCULAR | Status: AC
  Administered 2017-08-28: 25 ug via INTRAVENOUS
  Filled 2017-08-28: qty 2

## 2017-08-28 MED ORDER — IPRATROPIUM-ALBUTEROL 0.5-2.5 (3) MG/3ML IN SOLN
3.0000 mL | Freq: Once | RESPIRATORY_TRACT | Status: AC
  Administered 2017-08-28: 3 mL via RESPIRATORY_TRACT
  Filled 2017-08-28: qty 3

## 2017-08-28 MED ORDER — ASPIRIN 81 MG PO CHEW
324.0000 mg | CHEWABLE_TABLET | Freq: Once | ORAL | Status: AC
  Administered 2017-08-28: 324 mg via ORAL
  Filled 2017-08-28: qty 4

## 2017-08-28 NOTE — ED Triage Notes (Signed)
Patient brought in by ems from home with complaint of chest pain times 30 minutes. Patient rook nitro at home with relief. Patient also states that he has been short of breath times 6 weeks and increase swelling in bilateral feet.
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
## 2017-08-29 LAB — TROPONIN I: Troponin I: <0.03 ng/mL (ref <0.03)

## 2017-08-29 MED ORDER — IPRATROPIUM-ALBUTEROL 0.5-2.5 (3) MG/3ML IN SOLN
RESPIRATORY_TRACT | Status: AC
  Filled 2017-08-29: qty 3

## 2017-08-29 MED ORDER — LORAZEPAM 0.5 MG PO TABS
ORAL_TABLET | ORAL | Status: AC
  Filled 2017-08-29: qty 1

## 2017-08-29 MED ORDER — LORAZEPAM 0.5 MG PO TABS: 0.5000 mg | ORAL_TABLET | Freq: Three times a day (TID) | ORAL | 0 refills | Status: DC | PRN

## 2017-08-29 NOTE — Discharge Instructions (Signed)
1.  Continue and finish the antibiotic as previously prescribed by your doctor. 2.  You may take anxiety medicine 3 times daily as needed. 3.  Return to the ER for worsening symptoms, persistent vomiting, difficulty breathing or other concerns.

## 2017-08-29 NOTE — ED Notes (Signed)
See paper charting for med administration.

## 2017-08-29 NOTE — ED Notes (Signed)
See paper chart for downtime charting and medication administration.

## 2017-08-30 ENCOUNTER — Ambulatory Visit: Payer: Medicare Other

## 2017-09-01 ENCOUNTER — Ambulatory Visit: Payer: Medicare Other

## 2017-09-01 ENCOUNTER — Other Ambulatory Visit: Payer: Self-pay | Admitting: Internal Medicine

## 2017-09-01 DIAGNOSIS — R9389 Abnormal findings on diagnostic imaging of other specified body structures: Secondary | ICD-10-CM

## 2017-09-03 ENCOUNTER — Ambulatory Visit: Payer: Medicare Other

## 2017-09-06 ENCOUNTER — Ambulatory Visit: Payer: Medicare Other

## 2017-09-08 ENCOUNTER — Ambulatory Visit: Payer: Medicare Other

## 2017-09-08 ENCOUNTER — Encounter
Admission: RE | Admit: 2017-09-08 | Discharge: 2017-09-08 | Disposition: A | Discharge status: Home / Self Care | Payer: Medicare Other | Source: Ambulatory Visit | Attending: Internal Medicine | Admitting: Internal Medicine

## 2017-09-08 DIAGNOSIS — R9389 Abnormal findings on diagnostic imaging of other specified body structures: Secondary | ICD-10-CM | POA: Diagnosis present

## 2017-09-08 LAB — GLUCOSE, CAPILLARY: Glucose-Capillary: 120 mg/dL — ABNORMAL HIGH (ref 65–99)

## 2017-09-08 MED ORDER — FLUDEOXYGLUCOSE F - 18 (FDG) INJECTION
12.0000 | Freq: Once | INTRAVENOUS | Status: AC | PRN
  Administered 2017-09-08: 12.59 via INTRAVENOUS

## 2017-09-10 ENCOUNTER — Ambulatory Visit: Payer: Medicare Other

## 2017-09-13 ENCOUNTER — Ambulatory Visit: Payer: Medicare Other

## 2017-09-14 ENCOUNTER — Ambulatory Visit (INDEPENDENT_AMBULATORY_CARE_PROVIDER_SITE_OTHER): Payer: Medicare Other | Admitting: Cardiothoracic Surgery

## 2017-09-14 ENCOUNTER — Telehealth: Payer: Self-pay

## 2017-09-14 VITALS — Ht 71.0 in

## 2017-09-14 DIAGNOSIS — R918 Other nonspecific abnormal finding of lung field: Secondary | ICD-10-CM

## 2017-09-14 NOTE — Addendum Note (Signed)
Addended by: Lowella Curb on: 09/14/2017 09:52 AM   Modules accepted: Orders
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
## 2017-09-14 NOTE — Addendum Note (Signed)
Addended by: Lowella Curb on: 09/14/2017 02:44 PM   Modules accepted: Orders

## 2017-09-14 NOTE — Telephone Encounter (Signed)
Call made to speciality scheduling at this time. Left a message for Pamala Hurry to call back with appointments for PFT test and CT Guided Lung Biopsy.

## 2017-09-14 NOTE — Addendum Note (Signed)
Addended by: Lowella Curb on: 09/14/2017 01:43 PM   Modules accepted: Orders

## 2017-09-14 NOTE — Patient Instructions (Addendum)
We have scheduled you for PFT's (Pulmonary Function Testing). We will call you as soon as this has been scheduled and let you know the details of your upcoming appointment.  We will see you back in our office after the test has resulted so that we can review results and discuss possible surgery options with you.  You may take your morning medications prior to your testing. However, do not use ANY nicotine products or inhalers the morning of your testing as these may alter tests and may cause testing to need to be repeated.  If you have any questions or concerns, please call our office.   Pulmonary Function Tests Pulmonary function tests (PFTs) are used to measure how well your lungs work, find out what is causing your lung problems, and figure out the best treatment for you. You may have PFTs:  When you have an illness involving the lungs.  To follow changes in your lung function over time if you have a chronic lung disease.  If you are an Nature conservation officer. This checks the effects of being exposed to chemicals over a long period of time.  To check lung function before having surgery or other procedures.  To check your lungs if you smoke.  To check if prescribed medicines or treatments are helping your lungs.  Your results will be compared to the expected lung function of someone with healthy lungs who is similar to you in:  Age.  Gender.  Height.  Weight.  Race or ethnicity.  This is done to show how your lungs compare to normal lung function (percent predicted). This is how your health care provider knows if your lung function is normal or not. If you have had PFTs done before, your health care provider will compare your current results with past results. This shows if your lung function is better, worse, or the same as before. Tell a health care provider about:  Any allergies you have.  All medicines you are taking, including inhaler or nebulizer medicines, vitamins,  herbs, eye drops, creams, and over-the-counter medicines.  Any blood disorders you have.  Any surgeries you have had, especially recent eye surgery, abdominal surgery, or chest surgery. These can make PFTs difficult or unsafe.  Any medical conditions you have, including chest pain or heart problems, tuberculosis, or respiratory infections such as pneumonia, a cold, or the flu.  Any fear of being in closed spaces (claustrophobia). Some of your tests may be in a closed space. What are the risks? Generally, this is a safe procedure. However, problems may occur, including:  Light-headedness due to over-breathing (hyperventilation).  An asthma attack from deep breathing.  A collapsed lung.  What happens before the procedure?  Take over-the-counter and prescription medicines only as told by your health care provider. If you take inhaler or nebulizer medicines, ask your health care provider which medicines you should take on the day of your testing. Some inhaler medicines may interfere with PFTs if they are taken shortly before the tests.  Follow your health care provider's instructions on eating and drinking restrictions. This may include avoiding eating large meals and drinking alcohol before the testing.  Do not use any products that contain nicotine or tobacco, such as cigarettes and e-cigarettes. If you need help quitting, ask your health care provider.  Wear comfortable clothing that will not interfere with breathing. What happens during the procedure?  You will be given a soft nose clip to wear. This is done so all of  your breaths will go through your mouth instead of your nose.  You will be given a germ-free (sterile) mouthpiece. It will be attached to a machine that measures your breathing (spirometer).  You will be asked to do various breathing maneuvers. The maneuvers will be done by breathing in (inhaling) and breathing out (exhaling). You may be asked to repeat the maneuvers  several times before the testing is done.  It is important to follow the instructions exactly to get accurate results. Make sure to blow as hard and as fast as you can when you are told to do so.  You may be given a medicine that makes the small air passages in your lungs larger (bronchodilator) after testing has been done. This medicine will make it easier for you to breathe.  The tests will be repeated after the bronchodilator has taken effect.  You will be monitored carefully during the procedure for faintness, dizziness, trouble breathing, or any other problems. The procedure may vary among health care providers and hospitals. What happens after the procedure?  It is up to you to get your test results. Ask your health care provider, or the department that is doing the tests, when your results will be ready. After you have received your test results, talk with your health care provider about treatment options, if necessary. Summary  Pulmonary function tests (PFTs) are used to measure how well your lungs work, find out what is causing your lung problems, and figure out the best treatment for you.  Wear comfortable clothing that will not interfere with breathing.  It is up to you to get your test results. After you have received them, talk with your health care provider about treatment options, if necessary. This information is not intended to replace advice given to you by your health care provider. Make sure you discuss any questions you have with your health care provider. Document Released: 06/04/2004 Document Revised: 09/03/2016 Document Reviewed: 09/03/2016 Elsevier Interactive Patient Education  2017 [employer].    We will contact [facility] to have a release of all images.  We will contact you once we receive these images so that we can schedule your CT Guided Lung Biopsy if needed.  If you have any questions or concerns please give our office a call.

## 2017-09-15 ENCOUNTER — Ambulatory Visit: Payer: Medicare Other

## 2017-09-15 NOTE — Telephone Encounter (Addendum)
Pamala Hurry returned my call at this time and she was able get the patient scheduled for PFT on 11/27 at 1:15. I will call patient and advise.  Call made to patient at this time. Spoke with patient's wife and advised her that we have his PFT scheduled for 11/27 at 1:15 at the [facility]. She verbalized understanding and I advised her that I will contact her with the appointment for the CT Guided Biopsy.

## 2017-09-15 NOTE — Addendum Note (Signed)
Addended by: Lowella Curb on: 09/15/2017 08:25 AM   Modules accepted: Orders

## 2017-09-17 ENCOUNTER — Ambulatory Visit: Payer: Medicare Other

## 2017-09-20 ENCOUNTER — Ambulatory Visit: Payer: Medicare Other

## 2017-09-21 ENCOUNTER — Ambulatory Visit: Payer: Medicare Other | Attending: Cardiothoracic Surgery

## 2017-09-21 DIAGNOSIS — R918 Other nonspecific abnormal finding of lung field: Secondary | ICD-10-CM

## 2017-09-21 DIAGNOSIS — J449 Chronic obstructive pulmonary disease, unspecified: Secondary | ICD-10-CM

## 2017-09-21 LAB — BLOOD GAS, ARTERIAL
Acid-Base Excess: 3.3 mmol/L — ABNORMAL HIGH (ref 0.0–2.0)
Bicarbonate: 27.8 mmol/L (ref 20.0–28.0)
FIO2: 0.21
O2 Saturation: 94 %
Patient temperature: 37
pCO2 arterial: 41 mmHg (ref 32.0–48.0)
pH, Arterial: 7.44 (ref 7.350–7.450)
pO2, Arterial: 68 mmHg — ABNORMAL LOW (ref 83.0–108.0)

## 2017-09-21 MED ORDER — ALBUTEROL SULFATE (2.5 MG/3ML) 0.083% IN NEBU
2.5000 mg | INHALATION_SOLUTION | Freq: Once | RESPIRATORY_TRACT | Status: AC
  Administered 2017-09-21: 2.5 mg via RESPIRATORY_TRACT
  Filled 2017-09-21: qty 3

## 2017-09-22 ENCOUNTER — Ambulatory Visit: Payer: Medicare Other

## 2017-09-24 ENCOUNTER — Ambulatory Visit: Payer: Medicare Other

## 2017-09-27 ENCOUNTER — Ambulatory Visit: Payer: Medicare Other

## 2017-09-29 ENCOUNTER — Telehealth: Payer: Self-pay | Admitting: Cardiothoracic Surgery

## 2017-09-29 ENCOUNTER — Ambulatory Visit: Payer: Medicare Other

## 2017-09-29 NOTE — Telephone Encounter (Signed)
Patient's wife has called and has spoke with Dr Genevive Bi. She advised Dr Genevive Bi that the patient see's his cardiologist is with [facility] and she would like to have all services at [facility]. The wife said that she did not need a referral at this time. Dr Genevive Bi did ask the wife that if she needed anything at all to call our office.   I will cancel the referral to oncology.

## 2017-10-01 ENCOUNTER — Ambulatory Visit: Payer: Medicare Other

## 2017-10-04 ENCOUNTER — Ambulatory Visit: Payer: Medicare Other

## 2017-10-06 ENCOUNTER — Ambulatory Visit: Payer: Medicare Other

## 2017-10-07 ENCOUNTER — Ambulatory Visit: Payer: Self-pay | Admitting: Internal Medicine

## 2017-10-08 ENCOUNTER — Ambulatory Visit: Payer: Medicare Other

## 2017-10-11 ENCOUNTER — Ambulatory Visit: Payer: Medicare Other

## 2017-10-13 ENCOUNTER — Ambulatory Visit: Payer: Medicare Other

## 2017-10-15 ENCOUNTER — Ambulatory Visit: Payer: Medicare Other

## 2017-10-18 ENCOUNTER — Ambulatory Visit: Payer: Medicare Other

## 2017-11-30 ENCOUNTER — Other Ambulatory Visit: Payer: Self-pay

## 2017-11-30 ENCOUNTER — Encounter: Payer: Medicare Other | Attending: Pulmonary Disease

## 2017-11-30 VITALS — Ht 70.25 in | Wt 235.8 lb

## 2017-11-30 DIAGNOSIS — J449 Chronic obstructive pulmonary disease, unspecified: Secondary | ICD-10-CM | POA: Diagnosis not present

## 2017-11-30 DIAGNOSIS — Z87891 Personal history of nicotine dependence: Secondary | ICD-10-CM | POA: Insufficient documentation

## 2017-11-30 NOTE — Patient Instructions (Signed)
Patient Instructions  Patient Details  Name: Jerry Jimenez MRN: [id number] Date of Birth: 12-11-1933 Referring Provider:  Rusty Aus, MD  Below are your personal goals for exercise, nutrition, and risk factors. Our goal is to help you stay on track towards obtaining and maintaining these goals. We will be discussing your progress on these goals with you throughout the program.  Initial Exercise Prescription: Initial Exercise Prescription - 11/30/17 1300      Date of Initial Exercise RX and Referring Provider   Date  11/30/17    Referring Provider  Sabra Heck      Treadmill   MPH  2    Grade  0    Minutes  15    METs  2.53      Recumbant Bike   Level  2    RPM  60    Watts  15    Minutes  15    METs  2.4      NuStep   Level  2    SPM  80    Minutes  15    METs  2.4      T5 Nustep   Level  1    SPM  80    Minutes  15    METs  2.4      Prescription Details   Frequency (times per week)  3    Duration  Progress to 45 minutes of aerobic exercise without signs/symptoms of physical distress      Intensity   THRR 40-80% of Max Heartrate  102-126    Ratings of Perceived Exertion  11-15    Perceived Dyspnea  0-4      Resistance Training   Training Prescription  Yes    Weight  3 lb    Reps  10-15       Exercise Goals: Frequency: Be able to perform aerobic exercise two to three times per week in program working toward 2-5 days per week of home exercise.  Intensity: Work with a perceived exertion of 11 (fairly light) - 15 (hard) while following your exercise prescription.  We will make changes to your prescription with you as you progress through the program.   Duration: Be able to do 30 to 45 minutes of continuous aerobic exercise in addition to a 5 minute warm-up and a 5 minute cool-down routine.   Nutrition Goals: Your personal nutrition goals will be established when you do your nutrition analysis with the dietician.  The following are general nutrition  guidelines to follow: Cholesterol < 200mg /day Sodium < 1500mg /day Fiber: Men over 50 yrs - 30 grams per day  Personal Goals: Personal Goals and Risk Factors at Admission - 11/30/17 1128      Core Components/Risk Factors/Patient Goals on Admission    Weight Management  Yes;Weight Loss    Intervention  Weight Management: Develop a combined nutrition and exercise program designed to reach desired caloric intake, while maintaining appropriate intake of nutrient and fiber, sodium and fats, and appropriate energy expenditure required for the weight goal.;Weight Management: Provide education and appropriate resources to help participant work on and attain dietary goals.;Weight Management/Obesity: Establish reasonable short term and long term weight goals.;Obesity: Provide education and appropriate resources to help participant work on and attain dietary goals.    Admit Weight  235 lb 12.8 oz (107 kg)    Goal Weight: Short Term  230 lb (104.3 kg)    Goal Weight: Long Term  200 lb (90.7 kg)  Expected Outcomes  Short Term: Continue to assess and modify interventions until short term weight is achieved;Weight Maintenance: Understanding of the daily nutrition guidelines, which includes 25-35% calories from fat, 7% or less cal from saturated fats, less than 200mg  cholesterol, less than 1.5gm of sodium, & 5 or more servings of fruits and vegetables daily;Long Term: Adherence to nutrition and physical activity/exercise program aimed toward attainment of established weight goal;Weight Loss: Understanding of general recommendations for a balanced deficit meal plan, which promotes 1-2 lb weight loss per week and includes a negative energy balance of [id number] kcal/d;Understanding recommendations for meals to include 15-35% energy as protein, 25-35% energy from fat, 35-60% energy from carbohydrates, less than 200mg  of dietary cholesterol, 20-35 gm of total fiber daily;Understanding of distribution of calorie intake  throughout the day with the consumption of 4-5 meals/snacks    Improve shortness of breath with ADL's  Yes    Intervention  Provide education, individualized exercise plan and daily activity instruction to help decrease symptoms of SOB with activities of daily living.    Expected Outcomes  Short Term: Improve cardiorespiratory fitness to achieve a reduction of symptoms when performing ADLs;Long Term: Be able to perform more ADLs without symptoms or delay the onset of symptoms    Hypertension  Yes    Intervention  Provide education on lifestyle modifcations including regular physical activity/exercise, weight management, moderate sodium restriction and increased consumption of fresh fruit, vegetables, and low fat dairy, alcohol moderation, and smoking cessation.;Monitor prescription use compliance.    Expected Outcomes  Short Term: Continued assessment and intervention until BP is < 140/35mm HG in hypertensive participants. < 130/6mm HG in hypertensive participants with diabetes, heart failure or chronic kidney disease.;Long Term: Maintenance of blood pressure at goal levels.    Lipids  Yes    Intervention  Provide education and support for participant on nutrition & aerobic/resistive exercise along with prescribed medications to achieve LDL 70mg , HDL >40mg .    Expected Outcomes  Short Term: Participant states understanding of desired cholesterol values and is compliant with medications prescribed. Participant is following exercise prescription and nutrition guidelines.;Long Term: Cholesterol controlled with medications as prescribed, with individualized exercise RX and with personalized nutrition plan. Value goals: LDL < 70mg , HDL > 40 mg.       Tobacco Use Initial Evaluation: Social History   Tobacco Use  Smoking Status Former Smoker  . Packs/day: 3.50  . Years: 35.00  . Pack years: 122.50  . Types: Cigarettes  . Last attempt to quit: 07/06/1987  . Years since quitting: 30.4  Smokeless  Tobacco Former Systems developer  . Types: Chew  . Quit date: 07/05/2002    Exercise Goals and Review: Exercise Goals    Row Name 07/20/17 1237 11/30/17 1305           Exercise Goals   Increase Physical Activity  Yes  Yes      Intervention  Provide advice, education, support and counseling about physical activity/exercise needs.;Develop an individualized exercise prescription for aerobic and resistive training based on initial evaluation findings, risk stratification, comorbidities and participant's personal goals.  Provide advice, education, support and counseling about physical activity/exercise needs.;Develop an individualized exercise prescription for aerobic and resistive training based on initial evaluation findings, risk stratification, comorbidities and participant's personal goals.      Expected Outcomes  Achievement of increased cardiorespiratory fitness and enhanced flexibility, muscular endurance and strength shown through measurements of functional capacity and personal statement of participant.  Short Term: Attend rehab on a  regular basis to increase amount of physical activity.;Long Term: Add in home exercise to make exercise part of routine and to increase amount of physical activity.;Long Term: Exercising regularly at least 3-5 days a week.      Increase Strength and Stamina  Yes  Yes      Intervention  Provide advice, education, support and counseling about physical activity/exercise needs.;Develop an individualized exercise prescription for aerobic and resistive training based on initial evaluation findings, risk stratification, comorbidities and participant's personal goals.  Provide advice, education, support and counseling about physical activity/exercise needs.;Develop an individualized exercise prescription for aerobic and resistive training based on initial evaluation findings, risk stratification, comorbidities and participant's personal goals.      Expected Outcomes  Achievement of  increased cardiorespiratory fitness and enhanced flexibility, muscular endurance and strength shown through measurements of functional capacity and personal statement of participant.  Short Term: Increase workloads from initial exercise prescription for resistance, speed, and METs.;Short Term: Perform resistance training exercises routinely during rehab and add in resistance training at home;Long Term: Improve cardiorespiratory fitness, muscular endurance and strength as measured by increased METs and functional capacity (6MWT)      Able to understand and use rate of perceived exertion (RPE) scale  Yes  Yes      Intervention  Provide education and explanation on how to use RPE scale  Provide education and explanation on how to use RPE scale      Expected Outcomes  Short Term: Able to use RPE daily in rehab to express subjective intensity level;Long Term:  Able to use RPE to guide intensity level when exercising independently  Short Term: Able to use RPE daily in rehab to express subjective intensity level;Long Term:  Able to use RPE to guide intensity level when exercising independently      Able to understand and use Dyspnea scale  Yes  Yes      Intervention  Provide education and explanation on how to use Dyspnea scale  Provide education and explanation on how to use Dyspnea scale      Expected Outcomes  Short Term: Able to use Dyspnea scale daily in rehab to express subjective sense of shortness of breath during exertion;Long Term: Able to use Dyspnea scale to guide intensity level when exercising independently  Short Term: Able to use Dyspnea scale daily in rehab to express subjective sense of shortness of breath during exertion;Long Term: Able to use Dyspnea scale to guide intensity level when exercising independently      Knowledge and understanding of Target Heart Rate Range (THRR)  Yes  Yes      Intervention  Provide education and explanation of THRR including how the numbers were predicted and where  they are located for reference  Provide education and explanation of THRR including how the numbers were predicted and where they are located for reference      Expected Outcomes  Short Term: Able to state/look up THRR;Long Term: Able to use THRR to govern intensity when exercising independently;Short Term: Able to use daily as guideline for intensity in rehab  Short Term: Able to state/look up THRR;Long Term: Able to use THRR to govern intensity when exercising independently;Short Term: Able to use daily as guideline for intensity in rehab      Able to check pulse independently  Yes  Yes      Intervention  Provide education and demonstration on how to check pulse in carotid and radial arteries.;Review the importance of being able to check  your own pulse for safety during independent exercise  Provide education and demonstration on how to check pulse in carotid and radial arteries.;Review the importance of being able to check your own pulse for safety during independent exercise      Expected Outcomes  Short Term: Able to explain why pulse checking is important during independent exercise;Long Term: Able to check pulse independently and accurately  Short Term: Able to explain why pulse checking is important during independent exercise;Long Term: Able to check pulse independently and accurately      Understanding of Exercise Prescription  Yes  Yes      Intervention  Provide education, explanation, and written materials on patient's individual exercise prescription  Provide education, explanation, and written materials on patient's individual exercise prescription      Expected Outcomes  Short Term: Able to explain program exercise prescription;Long Term: Able to explain home exercise prescription to exercise independently  Short Term: Able to explain program exercise prescription;Long Term: Able to explain home exercise prescription to exercise independently         Copy of goals given to participant.

## 2017-11-30 NOTE — Progress Notes (Signed)
Pulmonary Individual Treatment Plan  Patient Details  Name: Jerry Jimenez MRN: [id number] Date of Birth: 03-30-1934  Referring Provider:     Pulmonary Rehab from 11/30/2017 in [facility] Cardiac and Pulmonary Rehab  Referring Provider  Sabra Heck      Initial Encounter Date:    Pulmonary Rehab from 11/30/2017 in [facility] Cardiac and Pulmonary Rehab  Date  11/30/17  Referring Provider  Sabra Heck      Visit Diagnosis: Chronic obstructive pulmonary disease, unspecified COPD type ([place])  Patient's Home Medications on Admission:  Current Outpatient Medications:  .  aspirin EC 81 MG tablet, Take 81 mg by mouth daily., Disp: , Rfl:  .  cephALEXin (KEFLEX) 500 MG capsule, Take 1 capsule (500 mg total) by mouth every 12 (twelve) hours. (Patient not taking: Reported on 08/28/2017), Disp: 10 capsule, Rfl: 0 .  cholecalciferol (VITAMIN D) 1000 units tablet, Take 1,000 Units by mouth daily., Disp: , Rfl:  .  cyanocobalamin 500 MCG tablet, Take 500 mcg by mouth daily., Disp: , Rfl:  .  doxycycline (VIBRAMYCIN) 100 MG capsule, Take 100 mg by mouth 2 (two) times daily., Disp: , Rfl:  .  ELIQUIS 5 MG TABS tablet, Take 1 tablet (5 mg total) by mouth 2 (two) times daily. Patient advised to discuss with primary care physician before starting Eliquis.  It has been on hold for 4 weeks, Disp: 60 tablet, Rfl: 2 .  furosemide (LASIX) 40 MG tablet, Take 40 mg by mouth daily., Disp: , Rfl:  .  gabapentin (NEURONTIN) 100 MG capsule, Take 100 mg by mouth at bedtime., Disp: , Rfl:  .  glimepiride (AMARYL) 2 MG tablet, Take 1 tablet by mouth daily., Disp: , Rfl: 10 .  ipratropium-albuterol (DUONEB) 0.5-2.5 (3) MG/3ML SOLN, Take 3 mLs by nebulization every 6 (six) hours as needed., Disp: 360 mL, Rfl: 1 .  LORazepam (ATIVAN) 0.5 MG tablet, Take 1 tablet (0.5 mg total) every 8 (eight) hours as needed by mouth for anxiety., Disp: 15 tablet, Rfl: 0 .  magnesium oxide (MAG-OX) 400 MG tablet, Take 400 mg by mouth daily., Disp: , Rfl:  .   Melatonin 5 MG TABS, Take 1 tablet by mouth at bedtime., Disp: , Rfl:  .  montelukast (SINGULAIR) 10 MG tablet, Take 1 tablet by mouth every evening. , Disp: , Rfl: 2 .  multivitamin-lutein (OCUVITE-LUTEIN) CAPS capsule, Take 1 capsule by mouth daily., Disp: , Rfl:  .  omeprazole (PRILOSEC) 20 MG capsule, Take 1 capsule by mouth daily., Disp: , Rfl: 0 .  pramipexole (MIRAPEX) 0.5 MG tablet, Take 1 tablet by mouth 2 (two) times daily., Disp: , Rfl: 0 .  predniSONE (DELTASONE) 10 MG tablet, Take 50 mg daily.  Taper by 10 mg daily then stop. (Patient not taking: Reported on 08/28/2017), Disp: 15 tablet, Rfl: 0 .  PROAIR HFA 108 (90 Base) MCG/ACT inhaler, Inhale 2 puffs into the lungs every 6 (six) hours as needed., Disp: , Rfl: 11 .  simvastatin (ZOCOR) 20 MG tablet, Take 1 tablet by mouth daily at 6 PM. , Disp: , Rfl: 2 .  STIOLTO RESPIMAT 2.5-2.5 MCG/ACT AERS, Inhale 2 puffs into the lungs every morning. , Disp: , Rfl: 10 .  temazepam (RESTORIL) 30 MG capsule, Take 1 capsule by mouth daily., Disp: , Rfl: 0 .  traMADol (ULTRAM) 50 MG tablet, 100 mg every 6 (six) hours as needed. , Disp: , Rfl: 0  Past Medical History: Past Medical History:  Diagnosis Date  . CAD (coronary  artery disease)   . CHF (congestive heart failure) ([place])   . COPD (chronic obstructive pulmonary disease) ([place])   . Hypertension     Tobacco Use: Social History   Tobacco Use  Smoking Status Former Smoker  . Packs/day: 3.50  . Years: 35.00  . Pack years: 122.50  . Types: Cigarettes  . Last attempt to quit: 07/06/1987  . Years since quitting: 30.4  Smokeless Tobacco Former Systems developer  . Types: Chew  . Quit date: 07/05/2002    Labs: Recent Review Flowsheet Data    Labs for ITP Cardiac and Pulmonary Rehab Latest Ref Rng & Units 09/21/2017   PHART 7.350 - 7.450 7.44   PCO2ART 32.0 - 48.0 mmHg 41   HCO3 20.0 - 28.0 mmol/L 27.8   O2SAT % 94.0       Pulmonary Assessment Scores: Pulmonary Assessment Scores    Row Name  07/20/17 1130 11/30/17 1121       ADL UCSD   ADL Phase  Entry  Entry    SOB Score total  58  51    Rest  3  2    Walk  3  3    Stairs  3  4    Bath  1  0    Dress  2  3    Shop  3  2      CAT Score   CAT Score  21  17      mMRC Score   mMRC Score  -  2       Pulmonary Function Assessment: Pulmonary Function Assessment - 11/30/17 1124      Pulmonary Function Tests   FVC%  92 % test done on 09/21/17    FEV1%  70 %    FEV1/FVC Ratio  59      Breath   Bilateral Breath Sounds  Clear    Shortness of Breath  Yes;Limiting activity       Exercise Target Goals: Date: 11/30/17  Exercise Program Goal: Individual exercise prescription set using results from initial 6 min walk test and THRR while considering  patient's activity barriers and safety.    Exercise Prescription Goal: Initial exercise prescription builds to 30-45 minutes a day of aerobic activity, 2-3 days per week.  Home exercise guidelines will be given to patient during program as part of exercise prescription that the participant will acknowledge.  Activity Barriers & Risk Stratification:   6 Minute Walk: 6 Minute Walk    Row Name 07/20/17 1237 11/30/17 1306       6 Minute Walk   Distance  1280 feet  1186 feet    Walk Time  6 minutes  6 minutes    # of Rest Breaks  0  0    MPH  2.42  2.25    METS  2.48  2.09    RPE  12  13    Perceived Dyspnea   2  2    VO2 Peak  8.7  7.33    Symptoms  No  No    Resting HR  75 bpm  77 bpm    Resting BP  146/80  128/66    Resting Oxygen Saturation   96 %  92 %    Exercise Oxygen Saturation  during 6 min walk  88 %  92 %    Max Ex. HR  111 bpm  107 bpm    Max Ex. BP  172/74  178/82  2 Minute Post BP  160/72  164/84      Interval HR   1 Minute HR  75  -    2 Minute HR  100  88    3 Minute HR  98  93    4 Minute HR  110  96    5 Minute HR  110  105    6 Minute HR  111  107    2 Minute Post HR  -  85    Interval Heart Rate?  Yes  -      Interval Oxygen    Interval Oxygen?  Yes  -    Baseline Oxygen Saturation %  96 %  92 %    1 Minute Oxygen Saturation %  93 %  -    1 Minute Liters of Oxygen  0 L  0 L    2 Minute Oxygen Saturation %  95 %  93 %    2 Minute Liters of Oxygen  0 L  0 L    3 Minute Oxygen Saturation %  94 %  99 %    3 Minute Liters of Oxygen  0 L  0 L    4 Minute Oxygen Saturation %  91 %  93 %    4 Minute Liters of Oxygen  0 L  0 L    5 Minute Oxygen Saturation %  91 %  93 %    5 Minute Liters of Oxygen  0 L  -    6 Minute Oxygen Saturation %  88 %  92 %    6 Minute Liters of Oxygen  0 L  0 L    2 Minute Post Oxygen Saturation %  95 %  95 %    2 Minute Post Liters of Oxygen  0 L  0 L      Oxygen Initial Assessment: Oxygen Initial Assessment - 11/30/17 1128      Home Oxygen   Home Oxygen Device  None    Sleep Oxygen Prescription  None    Home Exercise Oxygen Prescription  None    Home at Rest Exercise Oxygen Prescription  None      Initial 6 min Walk   Oxygen Used  None      Program Oxygen Prescription   Program Oxygen Prescription  None      Intervention   Short Term Goals  To learn and understand importance of maintaining oxygen saturations>88%;To learn and demonstrate proper use of respiratory medications;To learn and demonstrate proper pursed lip breathing techniques or other breathing techniques.;To learn and understand importance of monitoring SPO2 with pulse oximeter and demonstrate accurate use of the pulse oximeter.    Long  Term Goals  Verbalizes importance of monitoring SPO2 with pulse oximeter and return demonstration;Exhibits proper breathing techniques, such as pursed lip breathing or other method taught during program session;Demonstrates proper use of MDI's;Compliance with respiratory medication;Maintenance of O2 saturations>88%       Oxygen Re-Evaluation:   Oxygen Discharge (Final Oxygen Re-Evaluation):   Initial Exercise Prescription: Initial Exercise Prescription - 11/30/17 1300       Date of Initial Exercise RX and Referring Provider   Date  11/30/17    Referring Provider  Sabra Heck      Treadmill   MPH  2    Grade  0    Minutes  15    METs  2.53      Recumbant Bike   Level  2    RPM  60    Watts  15    Minutes  15    METs  2.4      NuStep   Level  2    SPM  80    Minutes  15    METs  2.4      T5 Nustep   Level  1    SPM  80    Minutes  15    METs  2.4      Prescription Details   Frequency (times per week)  3    Duration  Progress to 45 minutes of aerobic exercise without signs/symptoms of physical distress      Intensity   THRR 40-80% of Max Heartrate  102-126    Ratings of Perceived Exertion  11-15    Perceived Dyspnea  0-4      Resistance Training   Training Prescription  Yes    Weight  3 lb    Reps  10-15       Perform Capillary Blood Glucose checks as needed.  Exercise Prescription Changes:   Exercise Comments:   Exercise Goals and Review: Exercise Goals    Row Name 07/20/17 1237 11/30/17 1305           Exercise Goals   Increase Physical Activity  Yes  Yes      Intervention  Provide advice, education, support and counseling about physical activity/exercise needs.;Develop an individualized exercise prescription for aerobic and resistive training based on initial evaluation findings, risk stratification, comorbidities and participant's personal goals.  Provide advice, education, support and counseling about physical activity/exercise needs.;Develop an individualized exercise prescription for aerobic and resistive training based on initial evaluation findings, risk stratification, comorbidities and participant's personal goals.      Expected Outcomes  Achievement of increased cardiorespiratory fitness and enhanced flexibility, muscular endurance and strength shown through measurements of functional capacity and personal statement of participant.  Short Term: Attend rehab on a regular basis to increase amount of physical activity.;Long  Term: Add in home exercise to make exercise part of routine and to increase amount of physical activity.;Long Term: Exercising regularly at least 3-5 days a week.      Increase Strength and Stamina  Yes  Yes      Intervention  Provide advice, education, support and counseling about physical activity/exercise needs.;Develop an individualized exercise prescription for aerobic and resistive training based on initial evaluation findings, risk stratification, comorbidities and participant's personal goals.  Provide advice, education, support and counseling about physical activity/exercise needs.;Develop an individualized exercise prescription for aerobic and resistive training based on initial evaluation findings, risk stratification, comorbidities and participant's personal goals.      Expected Outcomes  Achievement of increased cardiorespiratory fitness and enhanced flexibility, muscular endurance and strength shown through measurements of functional capacity and personal statement of participant.  Short Term: Increase workloads from initial exercise prescription for resistance, speed, and METs.;Short Term: Perform resistance training exercises routinely during rehab and add in resistance training at home;Long Term: Improve cardiorespiratory fitness, muscular endurance and strength as measured by increased METs and functional capacity (6MWT)      Able to understand and use rate of perceived exertion (RPE) scale  Yes  Yes      Intervention  Provide education and explanation on how to use RPE scale  Provide education and explanation on how to use RPE scale      Expected Outcomes  Short Term: Able to use RPE daily in  rehab to express subjective intensity level;Long Term:  Able to use RPE to guide intensity level when exercising independently  Short Term: Able to use RPE daily in rehab to express subjective intensity level;Long Term:  Able to use RPE to guide intensity level when exercising independently      Able  to understand and use Dyspnea scale  Yes  Yes      Intervention  Provide education and explanation on how to use Dyspnea scale  Provide education and explanation on how to use Dyspnea scale      Expected Outcomes  Short Term: Able to use Dyspnea scale daily in rehab to express subjective sense of shortness of breath during exertion;Long Term: Able to use Dyspnea scale to guide intensity level when exercising independently  Short Term: Able to use Dyspnea scale daily in rehab to express subjective sense of shortness of breath during exertion;Long Term: Able to use Dyspnea scale to guide intensity level when exercising independently      Knowledge and understanding of Target Heart Rate Range (THRR)  Yes  Yes      Intervention  Provide education and explanation of THRR including how the numbers were predicted and where they are located for reference  Provide education and explanation of THRR including how the numbers were predicted and where they are located for reference      Expected Outcomes  Short Term: Able to state/look up THRR;Long Term: Able to use THRR to govern intensity when exercising independently;Short Term: Able to use daily as guideline for intensity in rehab  Short Term: Able to state/look up THRR;Long Term: Able to use THRR to govern intensity when exercising independently;Short Term: Able to use daily as guideline for intensity in rehab      Able to check pulse independently  Yes  Yes      Intervention  Provide education and demonstration on how to check pulse in carotid and radial arteries.;Review the importance of being able to check your own pulse for safety during independent exercise  Provide education and demonstration on how to check pulse in carotid and radial arteries.;Review the importance of being able to check your own pulse for safety during independent exercise      Expected Outcomes  Short Term: Able to explain why pulse checking is important during independent exercise;Long  Term: Able to check pulse independently and accurately  Short Term: Able to explain why pulse checking is important during independent exercise;Long Term: Able to check pulse independently and accurately      Understanding of Exercise Prescription  Yes  Yes      Intervention  Provide education, explanation, and written materials on patient's individual exercise prescription  Provide education, explanation, and written materials on patient's individual exercise prescription      Expected Outcomes  Short Term: Able to explain program exercise prescription;Long Term: Able to explain home exercise prescription to exercise independently  Short Term: Able to explain program exercise prescription;Long Term: Able to explain home exercise prescription to exercise independently         Exercise Goals Re-Evaluation :   Discharge Exercise Prescription (Final Exercise Prescription Changes):   Nutrition:  Target Goals: Understanding of nutrition guidelines, daily intake of sodium <1570m, cholesterol <2018m calories 30% from fat and 7% or less from saturated fats, daily to have 5 or more servings of fruits and vegetables.  Biometrics: Pre Biometrics - 11/30/17 1304      Pre Biometrics   Height  5' 10.25" (1.784 m)  Weight  235 lb 12.8 oz (107 kg)    Waist Circumference  45 inches    Hip Circumference  45.5 inches    Waist to Hip Ratio  0.99 %    BMI (Calculated)  33.61        Nutrition Therapy Plan and Nutrition Goals: Nutrition Therapy & Goals - 11/30/17 1120      Personal Nutrition Goals   Comments  He would like to lose weight and get information on eating healthier.      Intervention Plan   Intervention  Prescribe, educate and counsel regarding individualized specific dietary modifications aiming towards targeted core components such as weight, hypertension, lipid management, diabetes, heart failure and other comorbidities.;Nutrition handout(s) given to patient.    Expected Outcomes   Short Term Goal: Understand basic principles of dietary content, such as calories, fat, sodium, cholesterol and nutrients.;Long Term Goal: Adherence to prescribed nutrition plan.       Nutrition Assessments: Nutrition Assessments - 11/30/17 1120      MEDFICTS Scores   Pre Score  60       Nutrition Goals Re-Evaluation:   Nutrition Goals Discharge (Final Nutrition Goals Re-Evaluation):   Psychosocial: Target Goals: Acknowledge presence or absence of significant depression and/or stress, maximize coping skills, provide positive support system. Participant is able to verbalize types and ability to use techniques and skills needed for reducing stress and depression.   Initial Review & Psychosocial Screening: Initial Psych Review & Screening - 11/30/17 1118      Initial Review   Current issues with  Current Sleep Concerns;Current Stress Concerns    Source of Stress Concerns  Unable to perform yard/household activities;Chronic Illness    Comments  he has been having trouble sleeping lately and gets short of breath at times.      Family Dynamics   Good Support System?  Yes    Comments  patient has 2 sons and a daughter that live within 10 miles of him.      Barriers   Psychosocial barriers to participate in program  The patient should benefit from training in stress management and relaxation.      Screening Interventions   Interventions  Encouraged to exercise;Provide feedback about the scores to participant;Program counselor consult;To provide support and resources with identified psychosocial needs    Expected Outcomes  Short Term goal: Utilizing psychosocial counselor, staff and physician to assist with identification of specific Stressors or current issues interfering with healing process. Setting desired goal for each stressor or current issue identified.;Long Term Goal: Stressors or current issues are controlled or eliminated.;Short Term goal: Identification and review with  participant of any Quality of Life or Depression concerns found by scoring the questionnaire.;Long Term goal: The participant improves quality of Life and PHQ9 Scores as seen by post scores and/or verbalization of changes       Quality of Life Scores:  Scores of 19 and below usually indicate a poorer quality of life in these areas.  A difference of  2-3 points is a clinically meaningful difference.  A difference of 2-3 points in the total score of the Quality of Life Index has been associated with significant improvement in overall quality of life, self-image, physical symptoms, and general health in studies assessing change in quality of life.  PHQ-9: Recent Review Flowsheet Data    Depression screen [facility] 2/9 11/30/2017 07/20/2017   Decreased Interest 2 1   Down, Depressed, Hopeless 0 0   PHQ - 2 Score 2  1   Altered sleeping 3 3   Tired, decreased energy 3 3   Change in appetite 0 3   Feeling bad or failure about yourself  0 0   Trouble concentrating 0 0   Moving slowly or fidgety/restless 0 0   Suicidal thoughts 0 0   PHQ-9 Score 8 10   Difficult doing work/chores Somewhat difficult Not difficult at all     Interpretation of Total Score  Total Score Depression Severity:  1-4 = Minimal depression, 5-9 = Mild depression, 10-14 = Moderate depression, 15-19 = Moderately severe depression, 20-27 = Severe depression   Psychosocial Evaluation and Intervention:   Psychosocial Re-Evaluation:   Psychosocial Discharge (Final Psychosocial Re-Evaluation):   Education: Education Goals: Education classes will be provided on a weekly basis, covering required topics. Participant will state understanding/return demonstration of topics presented.  Learning Barriers/Preferences: Learning Barriers/Preferences - 11/30/17 1125      Learning Barriers/Preferences   Learning Barriers  Reading;Sight wears glasses    Learning Preferences  Individual Instruction;Verbal Instruction;Video        Education Topics:  Initial Evaluation Education: - Verbal, written and demonstration of respiratory meds, oximetry and breathing techniques. Instruction on use of nebulizers and MDIs and importance of monitoring MDI activations.   Pulmonary Rehab from 11/30/2017 in [facility] Cardiac and Pulmonary Rehab  Date  11/30/17  Educator  [facility]  Instruction Review Code  1- Verbalizes Understanding      General Nutrition Guidelines/Fats and Fiber: -Group instruction provided by verbal, written material, models and posters to present the general guidelines for heart healthy nutrition. Gives an explanation and review of dietary fats and fiber.   Pulmonary Rehab from 07/20/2017 in [facility] Cardiac and Pulmonary Rehab  Date  07/20/17  Educator  [facility]  Instruction Review Code  1- Verbalizes Understanding      Controlling Sodium/Reading Food Labels: -Group verbal and written material supporting the discussion of sodium use in heart healthy nutrition. Review and explanation with models, verbal and written materials for utilization of the food label.   Exercise Physiology & General Exercise Guidelines: - Group verbal and written instruction with models to review the exercise physiology of the cardiovascular system and associated critical values. Provides general exercise guidelines with specific guidelines to those with heart or lung disease.    Aerobic Exercise & Resistance Training: - Gives group verbal and written instruction on the various components of exercise. Focuses on aerobic and resistive training programs and the benefits of this training and how to safely progress through these programs.   Flexibility, Balance, Mind/Body Relaxation: Provides group verbal/written instruction on the benefits of flexibility and balance training, including mind/body exercise modes such as yoga, pilates and tai chi.  Demonstration and skill practice provided.   Stress and Anxiety: - Provides group verbal and written  instruction about the health risks of elevated stress and causes of high stress.  Discuss the correlation between heart/lung disease and anxiety and treatment options. Review healthy ways to manage with stress and anxiety.   Depression: - Provides group verbal and written instruction on the correlation between heart/lung disease and depressed mood, treatment options, and the stigmas associated with seeking treatment.   Exercise & Equipment Safety: - Individual verbal instruction and demonstration of equipment use and safety with use of the equipment.   Pulmonary Rehab from 11/30/2017 in [facility] Cardiac and Pulmonary Rehab  Date  11/30/17  Educator  [facility]  Instruction Review Code  1- Verbalizes Understanding      Infection  Prevention: - Provides verbal and written material to individual with discussion of infection control including proper hand washing and proper equipment cleaning during exercise session.   Pulmonary Rehab from 11/30/2017 in [facility] Cardiac and Pulmonary Rehab  Date  11/30/17  Educator  [facility]  Instruction Review Code  1- Verbalizes Understanding      Falls Prevention: - Provides verbal and written material to individual with discussion of falls prevention and safety.   Pulmonary Rehab from 11/30/2017 in [facility] Cardiac and Pulmonary Rehab  Date  11/30/17  Educator  [facility]  Instruction Review Code  1- Verbalizes Understanding      Diabetes: - Individual verbal and written instruction to review signs/symptoms of diabetes, desired ranges of glucose level fasting, after meals and with exercise. Advice that pre and post exercise glucose checks will be done for 3 sessions at entry of program.   Chronic Lung Diseases: - Group verbal and written instruction to review updates, respiratory medications, advancements in procedures and treatments. Discuss use of supplemental oxygen including available portable oxygen systems, continuous and intermittent flow rates, concentrators, personal use and  safety guidelines. Review proper use of inhaler and spacers. Provide informative websites for self-education.    Energy Conservation: - Provide group verbal and written instruction for methods to conserve energy, plan and organize activities. Instruct on pacing techniques, use of adaptive equipment and posture/positioning to relieve shortness of breath.   Triggers and Exacerbations: - Group verbal and written instruction to review types of environmental triggers and ways to prevent exacerbations. Discuss weather changes, air quality and the benefits of nasal washing. Review warning signs and symptoms to help prevent infections. Discuss techniques for effective airway clearance, coughing, and vibrations.   AED/CPR: - Group verbal and written instruction with the use of models to demonstrate the basic use of the AED with the basic ABC's of resuscitation.   Anatomy and Physiology of the Lungs: - Group verbal and written instruction with the use of models to provide basic lung anatomy and physiology related to function, structure and complications of lung disease.   Anatomy & Physiology of the Heart: - Group verbal and written instruction and models provide basic cardiac anatomy and physiology, with the coronary electrical and arterial systems. Review of Valvular disease and Heart Failure   Cardiac Medications: - Group verbal and written instruction to review commonly prescribed medications for heart disease. Reviews the medication, class of the drug, and side effects.   Know Your Numbers and Risk Factors: -Group verbal and written instruction about important numbers in your health.  Discussion of what are risk factors and how they play a role in the disease process.  Review of Cholesterol, Blood Pressure, Diabetes, and BMI and the role they play in your overall health.   Sleep Hygiene: -Provides group verbal and written instruction about how sleep can affect your health.  Define sleep  hygiene, discuss sleep cycles and impact of sleep habits. Review good sleep hygiene tips.    Other: -Provides group and verbal instruction on various topics (see comments)    Knowledge Questionnaire Score: Knowledge Questionnaire Score - 11/30/17 1125      Knowledge Questionnaire Score   Pre Score  16/18 reviewed with patient        Core Components/Risk Factors/Patient Goals at Admission: Personal Goals and Risk Factors at Admission - 11/30/17 1128      Core Components/Risk Factors/Patient Goals on Admission    Weight Management  Yes;Weight Loss    Intervention  Weight Management: Develop  a combined nutrition and exercise program designed to reach desired caloric intake, while maintaining appropriate intake of nutrient and fiber, sodium and fats, and appropriate energy expenditure required for the weight goal.;Weight Management: Provide education and appropriate resources to help participant work on and attain dietary goals.;Weight Management/Obesity: Establish reasonable short term and long term weight goals.;Obesity: Provide education and appropriate resources to help participant work on and attain dietary goals.    Admit Weight  235 lb 12.8 oz (107 kg)    Goal Weight: Short Term  230 lb (104.3 kg)    Goal Weight: Long Term  200 lb (90.7 kg)    Expected Outcomes  Short Term: Continue to assess and modify interventions until short term weight is achieved;Weight Maintenance: Understanding of the daily nutrition guidelines, which includes 25-35% calories from fat, 7% or less cal from saturated fats, less than 274m cholesterol, less than 1.5gm of sodium, & 5 or more servings of fruits and vegetables daily;Long Term: Adherence to nutrition and physical activity/exercise program aimed toward attainment of established weight goal;Weight Loss: Understanding of general recommendations for a balanced deficit meal plan, which promotes 1-2 lb weight loss per week and includes a negative energy  balance of [phone] kcal/d;Understanding recommendations for meals to include 15-35% energy as protein, 25-35% energy from fat, 35-60% energy from carbohydrates, less than 2011mof dietary cholesterol, 20-35 gm of total fiber daily;Understanding of distribution of calorie intake throughout the day with the consumption of 4-5 meals/snacks    Improve shortness of breath with ADL's  Yes    Intervention  Provide education, individualized exercise plan and daily activity instruction to help decrease symptoms of SOB with activities of daily living.    Expected Outcomes  Short Term: Improve cardiorespiratory fitness to achieve a reduction of symptoms when performing ADLs;Long Term: Be able to perform more ADLs without symptoms or delay the onset of symptoms    Hypertension  Yes    Intervention  Provide education on lifestyle modifcations including regular physical activity/exercise, weight management, moderate sodium restriction and increased consumption of fresh fruit, vegetables, and low fat dairy, alcohol moderation, and smoking cessation.;Monitor prescription use compliance.    Expected Outcomes  Short Term: Continued assessment and intervention until BP is < 140/9014mG in hypertensive participants. < 130/35m70m in hypertensive participants with diabetes, heart failure or chronic kidney disease.;Long Term: Maintenance of blood pressure at goal levels.    Lipids  Yes    Intervention  Provide education and support for participant on nutrition & aerobic/resistive exercise along with prescribed medications to achieve LDL <70mg47mL >40mg.49mExpected Outcomes  Short Term: Participant states understanding of desired cholesterol values and is compliant with medications prescribed. Participant is following exercise prescription and nutrition guidelines.;Long Term: Cholesterol controlled with medications as prescribed, with individualized exercise RX and with personalized nutrition plan. Value goals: LDL < 70mg, 41m > 40 mg.       Core Components/Risk Factors/Patient Goals Review:    Core Components/Risk Factors/Patient Goals at Discharge (Final Review):    ITP Comments: ITP Comments    Row Name 07/20/17 1309 11/30/17 1143 11/30/17 1144       ITP Comments  Medical evaluation completed. Chart sent to Dr. Mark [facility]ily [facility]or [facility] and [facility]. [facility]  Medical Evaluation completed. Chart sent for review and changes to Dr. Mark [facility]ily [facility]  Met face to  face with Doctor Emily Filbert director of [place]. Chart Reviewed and signed.        Comments: Initial ITP

## 2017-12-06 DIAGNOSIS — J449 Chronic obstructive pulmonary disease, unspecified: Secondary | ICD-10-CM

## 2017-12-06 NOTE — Progress Notes (Signed)
Pulmonary Individual Treatment Plan  Patient Details  Name: Jerry Jimenez  MRN: [id number] Date of Birth: 01-17-1934 Referring Provider:     Pulmonary Rehab from 11/30/2017 in [facility] Cardiac and Pulmonary Rehab  Referring Provider  Sabra Heck      Initial Encounter Date:    Pulmonary Rehab from 11/30/2017 in [facility] Cardiac and Pulmonary Rehab  Date  11/30/17  Referring Provider  Sabra Heck      Visit Diagnosis: Chronic obstructive pulmonary disease, unspecified COPD type ([place])  Patient's Home Medications on Admission:  Current Outpatient Medications:  .  aspirin EC 81 MG tablet, Take 81 mg by mouth daily., Disp: , Rfl:  .  cephALEXin (KEFLEX) 500 MG capsule, Take 1 capsule (500 mg total) by mouth every 12 (twelve) hours. (Patient not taking: Reported on 08/28/2017), Disp: 10 capsule, Rfl: 0 .  cholecalciferol (VITAMIN D) 1000 units tablet, Take 1,000 Units by mouth daily., Disp: , Rfl:  .  cyanocobalamin 500 MCG tablet, Take 500 mcg by mouth daily., Disp: , Rfl:  .  doxycycline (VIBRAMYCIN) 100 MG capsule, Take 100 mg by mouth 2 (two) times daily., Disp: , Rfl:  .  ELIQUIS 5 MG TABS tablet, Take 1 tablet (5 mg total) by mouth 2 (two) times daily. Patient advised to discuss with primary care physician before starting Eliquis.  It has been on hold for 4 weeks, Disp: 60 tablet, Rfl: 2 .  furosemide (LASIX) 40 MG tablet, Take 40 mg by mouth daily., Disp: , Rfl:  .  gabapentin (NEURONTIN) 100 MG capsule, Take 100 mg by mouth at bedtime., Disp: , Rfl:  .  glimepiride (AMARYL) 2 MG tablet, Take 1 tablet by mouth daily., Disp: , Rfl: 10 .  ipratropium-albuterol (DUONEB) 0.5-2.5 (3) MG/3ML SOLN, Take 3 mLs by nebulization every 6 (six) hours as needed., Disp: 360 mL, Rfl: 1 .  LORazepam (ATIVAN) 0.5 MG tablet, Take 1 tablet (0.5 mg total) every 8 (eight) hours as needed by mouth for anxiety., Disp: 15 tablet, Rfl: 0 .  magnesium oxide (MAG-OX) 400 MG tablet, Take 400 mg by mouth daily., Disp: , Rfl:  .   Melatonin 5 MG TABS, Take 1 tablet by mouth at bedtime., Disp: , Rfl:  .  montelukast (SINGULAIR) 10 MG tablet, Take 1 tablet by mouth every evening. , Disp: , Rfl: 2 .  multivitamin-lutein (OCUVITE-LUTEIN) CAPS capsule, Take 1 capsule by mouth daily., Disp: , Rfl:  .  omeprazole (PRILOSEC) 20 MG capsule, Take 1 capsule by mouth daily., Disp: , Rfl: 0 .  pramipexole (MIRAPEX) 0.5 MG tablet, Take 1 tablet by mouth 2 (two) times daily., Disp: , Rfl: 0 .  predniSONE (DELTASONE) 10 MG tablet, Take 50 mg daily.  Taper by 10 mg daily then stop. (Patient not taking: Reported on 08/28/2017), Disp: 15 tablet, Rfl: 0 .  PROAIR HFA 108 (90 Base) MCG/ACT inhaler, Inhale 2 puffs into the lungs every 6 (six) hours as needed., Disp: , Rfl: 11 .  simvastatin (ZOCOR) 20 MG tablet, Take 1 tablet by mouth daily at 6 PM. , Disp: , Rfl: 2 .  STIOLTO RESPIMAT 2.5-2.5 MCG/ACT AERS, Inhale 2 puffs into the lungs every morning. , Disp: , Rfl: 10 .  temazepam (RESTORIL) 30 MG capsule, Take 1 capsule by mouth daily., Disp: , Rfl: 0 .  traMADol (ULTRAM) 50 MG tablet, 100 mg every 6 (six) hours as needed. , Disp: , Rfl: 0  Past Medical History: Past Medical History:  Diagnosis Date  . CAD (coronary  artery disease)   . CHF (congestive heart failure) ([place])   . COPD (chronic obstructive pulmonary disease) ([place])   . Hypertension     Tobacco Use: Social History   Tobacco Use  Smoking Status Former Smoker  . Packs/day: 3.50  . Years: 35.00  . Pack years: 122.50  . Types: Cigarettes  . Last attempt to quit: 07/06/1987  . Years since quitting: 30.4  Smokeless Tobacco Former Systems developer  . Types: Chew  . Quit date: 07/05/2002    Labs: Recent Review Flowsheet Data    Labs for ITP Cardiac and Pulmonary Rehab Latest Ref Rng & Units 09/21/2017   PHART 7.350 - 7.450 7.44   PCO2ART 32.0 - 48.0 mmHg 41   HCO3 20.0 - 28.0 mmol/L 27.8   O2SAT % 94.0       Pulmonary Assessment Scores: Pulmonary Assessment Scores    Row Name  11/30/17 1121         ADL UCSD   ADL Phase  Entry     SOB Score total  51     Rest  2     Walk  3     Stairs  4     Bath  0     Dress  3     Shop  2       CAT Score   CAT Score  17       mMRC Score   mMRC Score  2        Pulmonary Function Assessment: Pulmonary Function Assessment - 11/30/17 1124      Pulmonary Function Tests   FVC%  92 % test done on 09/21/17    FEV1%  70 %    FEV1/FVC Ratio  59      Breath   Bilateral Breath Sounds  Clear    Shortness of Breath  Yes;Limiting activity       Exercise Target Goals:    Exercise Program Goal: Individual exercise prescription set using results from initial 6 min walk test and THRR while considering  patient's activity barriers and safety.    Exercise Prescription Goal: Initial exercise prescription builds to 30-45 minutes a day of aerobic activity, 2-3 days per week.  Home exercise guidelines will be given to patient during program as part of exercise prescription that the participant will acknowledge.  Activity Barriers & Risk Stratification:   6 Minute Walk: 6 Minute Walk    Row Name 11/30/17 1306         6 Minute Walk   Distance  1186 feet     Walk Time  6 minutes     # of Rest Breaks  0     MPH  2.25     METS  2.09     RPE  13     Perceived Dyspnea   2     VO2 Peak  7.33     Symptoms  No     Resting HR  77 bpm     Resting BP  128/66     Resting Oxygen Saturation   92 %     Exercise Oxygen Saturation  during 6 min walk  92 %     Max Ex. HR  107 bpm     Max Ex. BP  178/82     2 Minute Post BP  164/84       Interval HR   2 Minute HR  88     3 Minute HR  93  4 Minute HR  96     5 Minute HR  105     6 Minute HR  107     2 Minute Post HR  85       Interval Oxygen   Baseline Oxygen Saturation %  92 %     1 Minute Liters of Oxygen  0 L     2 Minute Oxygen Saturation %  93 %     2 Minute Liters of Oxygen  0 L     3 Minute Oxygen Saturation %  99 %     3 Minute Liters of Oxygen  0 L      4 Minute Oxygen Saturation %  93 %     4 Minute Liters of Oxygen  0 L     5 Minute Oxygen Saturation %  93 %     6 Minute Oxygen Saturation %  92 %     6 Minute Liters of Oxygen  0 L     2 Minute Post Oxygen Saturation %  95 %     2 Minute Post Liters of Oxygen  0 L       Oxygen Initial Assessment: Oxygen Initial Assessment - 11/30/17 1128      Home Oxygen   Home Oxygen Device  None    Sleep Oxygen Prescription  None    Home Exercise Oxygen Prescription  None    Home at Rest Exercise Oxygen Prescription  None      Initial 6 min Walk   Oxygen Used  None      Program Oxygen Prescription   Program Oxygen Prescription  None      Intervention   Short Term Goals  To learn and understand importance of maintaining oxygen saturations>88%;To learn and demonstrate proper use of respiratory medications;To learn and demonstrate proper pursed lip breathing techniques or other breathing techniques.;To learn and understand importance of monitoring SPO2 with pulse oximeter and demonstrate accurate use of the pulse oximeter.    Long  Term Goals  Verbalizes importance of monitoring SPO2 with pulse oximeter and return demonstration;Exhibits proper breathing techniques, such as pursed lip breathing or other method taught during program session;Demonstrates proper use of MDI's;Compliance with respiratory medication;Maintenance of O2 saturations>88%       Oxygen Re-Evaluation:   Oxygen Discharge (Final Oxygen Re-Evaluation):   Initial Exercise Prescription: Initial Exercise Prescription - 11/30/17 1300      Date of Initial Exercise RX and Referring Provider   Date  11/30/17    Referring Provider  Sabra Heck      Treadmill   MPH  2    Grade  0    Minutes  15    METs  2.53      Recumbant Bike   Level  2    RPM  60    Watts  15    Minutes  15    METs  2.4      NuStep   Level  2    SPM  80    Minutes  15    METs  2.4      T5 Nustep   Level  1    SPM  80    Minutes  15    METs  2.4       Prescription Details   Frequency (times per week)  3    Duration  Progress to 45 minutes of aerobic exercise without signs/symptoms of physical distress  Intensity   THRR 40-80% of Max Heartrate  102-126    Ratings of Perceived Exertion  11-15    Perceived Dyspnea  0-4      Resistance Training   Training Prescription  Yes    Weight  3 lb    Reps  10-15       Perform Capillary Blood Glucose checks as needed.  Exercise Prescription Changes:   Exercise Comments:   Exercise Goals and Review: Exercise Goals    Row Name 11/30/17 1305             Exercise Goals   Increase Physical Activity  Yes       Intervention  Provide advice, education, support and counseling about physical activity/exercise needs.;Develop an individualized exercise prescription for aerobic and resistive training based on initial evaluation findings, risk stratification, comorbidities and participant's personal goals.       Expected Outcomes  Short Term: Attend rehab on a regular basis to increase amount of physical activity.;Long Term: Add in home exercise to make exercise part of routine and to increase amount of physical activity.;Long Term: Exercising regularly at least 3-5 days a week.       Increase Strength and Stamina  Yes       Intervention  Provide advice, education, support and counseling about physical activity/exercise needs.;Develop an individualized exercise prescription for aerobic and resistive training based on initial evaluation findings, risk stratification, comorbidities and participant's personal goals.       Expected Outcomes  Short Term: Increase workloads from initial exercise prescription for resistance, speed, and METs.;Short Term: Perform resistance training exercises routinely during rehab and add in resistance training at home;Long Term: Improve cardiorespiratory fitness, muscular endurance and strength as measured by increased METs and functional capacity (6MWT)       Able  to understand and use rate of perceived exertion (RPE) scale  Yes       Intervention  Provide education and explanation on how to use RPE scale       Expected Outcomes  Short Term: Able to use RPE daily in rehab to express subjective intensity level;Long Term:  Able to use RPE to guide intensity level when exercising independently       Able to understand and use Dyspnea scale  Yes       Intervention  Provide education and explanation on how to use Dyspnea scale       Expected Outcomes  Short Term: Able to use Dyspnea scale daily in rehab to express subjective sense of shortness of breath during exertion;Long Term: Able to use Dyspnea scale to guide intensity level when exercising independently       Knowledge and understanding of Target Heart Rate Range (THRR)  Yes       Intervention  Provide education and explanation of THRR including how the numbers were predicted and where they are located for reference       Expected Outcomes  Short Term: Able to state/look up THRR;Long Term: Able to use THRR to govern intensity when exercising independently;Short Term: Able to use daily as guideline for intensity in rehab       Able to check pulse independently  Yes       Intervention  Provide education and demonstration on how to check pulse in carotid and radial arteries.;Review the importance of being able to check your own pulse for safety during independent exercise       Expected Outcomes  Short Term: Able to explain why pulse  checking is important during independent exercise;Long Term: Able to check pulse independently and accurately       Understanding of Exercise Prescription  Yes       Intervention  Provide education, explanation, and written materials on patient's individual exercise prescription       Expected Outcomes  Short Term: Able to explain program exercise prescription;Long Term: Able to explain home exercise prescription to exercise independently          Exercise Goals Re-Evaluation  :   Discharge Exercise Prescription (Final Exercise Prescription Changes):   Nutrition:  Target Goals: Understanding of nutrition guidelines, daily intake of sodium <159m, cholesterol <2076m calories 30% from fat and 7% or less from saturated fats, daily to have 5 or more servings of fruits and vegetables.  Biometrics: Pre Biometrics - 11/30/17 1304      Pre Biometrics   Height  5' 10.25" (1.784 m)    Weight  235 lb 12.8 oz (107 kg)    Waist Circumference  45 inches    Hip Circumference  45.5 inches    Waist to Hip Ratio  0.99 %    BMI (Calculated)  33.61        Nutrition Therapy Plan and Nutrition Goals: Nutrition Therapy & Goals - 11/30/17 1120      Personal Nutrition Goals   Comments  He would like to lose weight and get information on eating healthier.      Intervention Plan   Intervention  Prescribe, educate and counsel regarding individualized specific dietary modifications aiming towards targeted core components such as weight, hypertension, lipid management, diabetes, heart failure and other comorbidities.;Nutrition handout(s) given to patient.    Expected Outcomes  Short Term Goal: Understand basic principles of dietary content, such as calories, fat, sodium, cholesterol and nutrients.;Long Term Goal: Adherence to prescribed nutrition plan.       Nutrition Assessments: Nutrition Assessments - 11/30/17 1120      MEDFICTS Scores   Pre Score  60       Nutrition Goals Re-Evaluation:   Nutrition Goals Discharge (Final Nutrition Goals Re-Evaluation):   Psychosocial: Target Goals: Acknowledge presence or absence of significant depression and/or stress, maximize coping skills, provide positive support system. Participant is able to verbalize types and ability to use techniques and skills needed for reducing stress and depression.   Initial Review & Psychosocial Screening: Initial Psych Review & Screening - 11/30/17 1118      Initial Review   Current issues  with  Current Sleep Concerns;Current Stress Concerns    Source of Stress Concerns  Unable to perform yard/household activities;Chronic Illness    Comments  he has been having trouble sleeping lately and gets short of breath at times.      Family Dynamics   Good Support System?  Yes    Comments  patient has 2 sons and a daughter that live within 10 miles of him.      Barriers   Psychosocial barriers to participate in program  The patient should benefit from training in stress management and relaxation.      Screening Interventions   Interventions  Encouraged to exercise;Provide feedback about the scores to participant;Program counselor consult;To provide support and resources with identified psychosocial needs    Expected Outcomes  Short Term goal: Utilizing psychosocial counselor, staff and physician to assist with identification of specific Stressors or current issues interfering with healing process. Setting desired goal for each stressor or current issue identified.;Long Term Goal: Stressors or current issues  are controlled or eliminated.;Short Term goal: Identification and review with participant of any Quality of Life or Depression concerns found by scoring the questionnaire.;Long Term goal: The participant improves quality of Life and PHQ9 Scores as seen by post scores and/or verbalization of changes       Quality of Life Scores:  Scores of 19 and below usually indicate a poorer quality of life in these areas.  A difference of  2-3 points is a clinically meaningful difference.  A difference of 2-3 points in the total score of the Quality of Life Index has been associated with significant improvement in overall quality of life, self-image, physical symptoms, and general health in studies assessing change in quality of life.  PHQ-9: Recent Review Flowsheet Data    Depression screen [facility] 2/9 11/30/2017 07/20/2017   Decreased Interest 2 1   Down, Depressed, Hopeless 0 0   PHQ - 2 Score 2 1    Altered sleeping 3 3   Tired, decreased energy 3 3   Change in appetite 0 3   Feeling bad or failure about yourself  0 0   Trouble concentrating 0 0   Moving slowly or fidgety/restless 0 0   Suicidal thoughts 0 0   PHQ-9 Score 8 10   Difficult doing work/chores Somewhat difficult Not difficult at all     Interpretation of Total Score  Total Score Depression Severity:  1-4 = Minimal depression, 5-9 = Mild depression, 10-14 = Moderate depression, 15-19 = Moderately severe depression, 20-27 = Severe depression   Psychosocial Evaluation and Intervention:   Psychosocial Re-Evaluation:   Psychosocial Discharge (Final Psychosocial Re-Evaluation):   Education: Education Goals: Education classes will be provided on a weekly basis, covering required topics. Participant will state understanding/return demonstration of topics presented.  Learning Barriers/Preferences: Learning Barriers/Preferences - 11/30/17 1125      Learning Barriers/Preferences   Learning Barriers  Reading;Sight wears glasses    Learning Preferences  Individual Instruction;Verbal Instruction;Video       Education Topics:  Initial Evaluation Education: - Verbal, written and demonstration of respiratory meds, oximetry and breathing techniques. Instruction on use of nebulizers and MDIs and importance of monitoring MDI activations.   Pulmonary Rehab from 11/30/2017 in [facility] Cardiac and Pulmonary Rehab  Date  11/30/17  Educator  [facility]  Instruction Review Code  1- Verbalizes Understanding      General Nutrition Guidelines/Fats and Fiber: -Group instruction provided by verbal, written material, models and posters to present the general guidelines for heart healthy nutrition. Gives an explanation and review of dietary fats and fiber.   Pulmonary Rehab from 07/20/2017 in [facility] Cardiac and Pulmonary Rehab  Date  07/20/17  Educator  [facility]  Instruction Review Code  1- Verbalizes Understanding      Controlling Sodium/Reading  Food Labels: -Group verbal and written material supporting the discussion of sodium use in heart healthy nutrition. Review and explanation with models, verbal and written materials for utilization of the food label.   Exercise Physiology & General Exercise Guidelines: - Group verbal and written instruction with models to review the exercise physiology of the cardiovascular system and associated critical values. Provides general exercise guidelines with specific guidelines to those with heart or lung disease.    Aerobic Exercise & Resistance Training: - Gives group verbal and written instruction on the various components of exercise. Focuses on aerobic and resistive training programs and the benefits of this training and how to safely progress through these programs.   Flexibility, Balance, Mind/Body Relaxation:  Provides group verbal/written instruction on the benefits of flexibility and balance training, including mind/body exercise modes such as yoga, pilates and tai chi.  Demonstration and skill practice provided.   Stress and Anxiety: - Provides group verbal and written instruction about the health risks of elevated stress and causes of high stress.  Discuss the correlation between heart/lung disease and anxiety and treatment options. Review healthy ways to manage with stress and anxiety.   Depression: - Provides group verbal and written instruction on the correlation between heart/lung disease and depressed mood, treatment options, and the stigmas associated with seeking treatment.   Exercise & Equipment Safety: - Individual verbal instruction and demonstration of equipment use and safety with use of the equipment.   Pulmonary Rehab from 11/30/2017 in [facility] Cardiac and Pulmonary Rehab  Date  11/30/17  Educator  [facility]  Instruction Review Code  1- Verbalizes Understanding      Infection Prevention: - Provides verbal and written material to individual with discussion of infection control  including proper hand washing and proper equipment cleaning during exercise session.   Pulmonary Rehab from 11/30/2017 in [facility] Cardiac and Pulmonary Rehab  Date  11/30/17  Educator  [facility]  Instruction Review Code  1- Verbalizes Understanding      Falls Prevention: - Provides verbal and written material to individual with discussion of falls prevention and safety.   Pulmonary Rehab from 11/30/2017 in [facility] Cardiac and Pulmonary Rehab  Date  11/30/17  Educator  [facility]  Instruction Review Code  1- Verbalizes Understanding      Diabetes: - Individual verbal and written instruction to review signs/symptoms of diabetes, desired ranges of glucose level fasting, after meals and with exercise. Advice that pre and post exercise glucose checks will be done for 3 sessions at entry of program.   Chronic Lung Diseases: - Group verbal and written instruction to review updates, respiratory medications, advancements in procedures and treatments. Discuss use of supplemental oxygen including available portable oxygen systems, continuous and intermittent flow rates, concentrators, personal use and safety guidelines. Review proper use of inhaler and spacers. Provide informative websites for self-education.    Energy Conservation: - Provide group verbal and written instruction for methods to conserve energy, plan and organize activities. Instruct on pacing techniques, use of adaptive equipment and posture/positioning to relieve shortness of breath.   Triggers and Exacerbations: - Group verbal and written instruction to review types of environmental triggers and ways to prevent exacerbations. Discuss weather changes, air quality and the benefits of nasal washing. Review warning signs and symptoms to help prevent infections. Discuss techniques for effective airway clearance, coughing, and vibrations.   AED/CPR: - Group verbal and written instruction with the use of models to demonstrate the basic use of the AED with  the basic ABC's of resuscitation.   Anatomy and Physiology of the Lungs: - Group verbal and written instruction with the use of models to provide basic lung anatomy and physiology related to function, structure and complications of lung disease.   Anatomy & Physiology of the Heart: - Group verbal and written instruction and models provide basic cardiac anatomy and physiology, with the coronary electrical and arterial systems. Review of Valvular disease and Heart Failure   Cardiac Medications: - Group verbal and written instruction to review commonly prescribed medications for heart disease. Reviews the medication, class of the drug, and side effects.   Know Your Numbers and Risk Factors: -Group verbal and written instruction about important numbers in your health.  Discussion of what  are risk factors and how they play a role in the disease process.  Review of Cholesterol, Blood Pressure, Diabetes, and BMI and the role they play in your overall health.   Sleep Hygiene: -Provides group verbal and written instruction about how sleep can affect your health.  Define sleep hygiene, discuss sleep cycles and impact of sleep habits. Review good sleep hygiene tips.    Other: -Provides group and verbal instruction on various topics (see comments)    Knowledge Questionnaire Score: Knowledge Questionnaire Score - 11/30/17 1125      Knowledge Questionnaire Score   Pre Score  16/18 reviewed with patient        Core Components/Risk Factors/Patient Goals at Admission: Personal Goals and Risk Factors at Admission - 11/30/17 1128      Core Components/Risk Factors/Patient Goals on Admission    Weight Management  Yes;Weight Loss    Intervention  Weight Management: Develop a combined nutrition and exercise program designed to reach desired caloric intake, while maintaining appropriate intake of nutrient and fiber, sodium and fats, and appropriate energy expenditure required for the weight  goal.;Weight Management: Provide education and appropriate resources to help participant work on and attain dietary goals.;Weight Management/Obesity: Establish reasonable short term and long term weight goals.;Obesity: Provide education and appropriate resources to help participant work on and attain dietary goals.    Admit Weight  235 lb 12.8 oz (107 kg)    Goal Weight: Short Term  230 lb (104.3 kg)    Goal Weight: Long Term  200 lb (90.7 kg)    Expected Outcomes  Short Term: Continue to assess and modify interventions until short term weight is achieved;Weight Maintenance: Understanding of the daily nutrition guidelines, which includes 25-35% calories from fat, 7% or less cal from saturated fats, less than 274m cholesterol, less than 1.5gm of sodium, & 5 or more servings of fruits and vegetables daily;Long Term: Adherence to nutrition and physical activity/exercise program aimed toward attainment of established weight goal;Weight Loss: Understanding of general recommendations for a balanced deficit meal plan, which promotes 1-2 lb weight loss per week and includes a negative energy balance of [phone] kcal/d;Understanding recommendations for meals to include 15-35% energy as protein, 25-35% energy from fat, 35-60% energy from carbohydrates, less than 2050mof dietary cholesterol, 20-35 gm of total fiber daily;Understanding of distribution of calorie intake throughout the day with the consumption of 4-5 meals/snacks    Improve shortness of breath with ADL's  Yes    Intervention  Provide education, individualized exercise plan and daily activity instruction to help decrease symptoms of SOB with activities of daily living.    Expected Outcomes  Short Term: Improve cardiorespiratory fitness to achieve a reduction of symptoms when performing ADLs;Long Term: Be able to perform more ADLs without symptoms or delay the onset of symptoms    Hypertension  Yes    Intervention  Provide education on lifestyle  modifcations including regular physical activity/exercise, weight management, moderate sodium restriction and increased consumption of fresh fruit, vegetables, and low fat dairy, alcohol moderation, and smoking cessation.;Monitor prescription use compliance.    Expected Outcomes  Short Term: Continued assessment and intervention until BP is < 140/9030mG in hypertensive participants. < 130/2m18m in hypertensive participants with diabetes, heart failure or chronic kidney disease.;Long Term: Maintenance of blood pressure at goal levels.    Lipids  Yes    Intervention  Provide education and support for participant on nutrition & aerobic/resistive exercise along with prescribed medications to achieve LDL <70mg58m  HDL >26m.    Expected Outcomes  Short Term: Participant states understanding of desired cholesterol values and is compliant with medications prescribed. Participant is following exercise prescription and nutrition guidelines.;Long Term: Cholesterol controlled with medications as prescribed, with individualized exercise RX and with personalized nutrition plan. Value goals: LDL < 785m HDL > 40 mg.       Core Components/Risk Factors/Patient Goals Review:    Core Components/Risk Factors/Patient Goals at Discharge (Final Review):    ITP Comments: ITP Comments    Row Name 11/30/17 1143 11/30/17 1144 12/06/17 0905       ITP Comments  Medical Evaluation completed. Chart sent for review and changes to Dr. MaEmily Filbertirector of [facility]Diagnosis [facility]  Met face to face with Doctor MaEmily Filbertirector of LuTaft HeightsChart Reviewed and signed.  30 day review completed. ITP sent to Dr. MaEmily Filbertirector of LuSullyContinue with ITP unless changes are made by physician.        Comments: 30 day review

## 2017-12-10 ENCOUNTER — Encounter: Payer: Medicare Other | Admitting: *Deleted

## 2017-12-10 DIAGNOSIS — J449 Chronic obstructive pulmonary disease, unspecified: Secondary | ICD-10-CM | POA: Diagnosis not present

## 2017-12-10 NOTE — Progress Notes (Signed)
Daily Session Note  Patient Details  Name: Jerry Jimenez MRN: [id number] Date of Birth: 01-10-1934 Referring Provider:     Pulmonary Rehab from 11/30/2017 in [facility] Cardiac and Pulmonary Rehab  Referring Provider  Sabra Heck      Encounter Date: 12/10/2017  Check In: Session Check In - 12/10/17 1010      Check-In   Location  [facility]-Cardiac & Pulmonary Rehab    Staff Present  Renita Papa, RN BSN;Joseph Foy [place], [place], ACSM CEP, Exercise Physiologist    Supervising physician immediately available to respond to emergencies  [facility] immediately available ER MD    Physician(s)  Dr. Jacqualine Code and Quentin Cornwall    Medication changes reported      No    Fall or balance concerns reported     No    Warm-up and Cool-down  Performed as group-led instruction    Resistance Training Performed  Yes    VAD Patient?  No      Pain Assessment   Currently in Pain?  No/denies          Social History   Tobacco Use  Smoking Status Former Smoker  . Packs/day: 3.50  . Years: 35.00  . Pack years: 122.50  . Types: Cigarettes  . Last attempt to quit: 07/06/1987  . Years since quitting: 30.4  Smokeless Tobacco Former Systems developer  . Types: Chew  . Quit date: 07/05/2002    Goals Met:  Proper associated with RPD/PD & O2 Sat Independence with exercise equipment Using PLB without cueing & demonstrates good technique Exercise tolerated well No report of cardiac concerns or symptoms  Goals Unmet:  Not Applicable  Comments: First full day of exercise!  Patient was oriented to gym and equipment including functions, settings, policies, and procedures.  Patient's individual exercise prescription and treatment plan were reviewed.  All starting workloads were established based on the results of the 6 minute walk test done at initial orientation visit.  The plan for exercise progression was also introduced and progression will be customized based on patient's performance and goals.    Dr. Emily Filbert is Medical Director for [place] and [facility].

## 2017-12-13 DIAGNOSIS — J449 Chronic obstructive pulmonary disease, unspecified: Secondary | ICD-10-CM

## 2017-12-13 NOTE — Progress Notes (Signed)
Daily Session Note  Patient Details  Name: Jerry Jimenez MRN: [id number] Date of Birth: Mar 04, 1934 Referring Provider:     Pulmonary Rehab from 11/30/2017 in [facility] Cardiac and Pulmonary Rehab  Referring Provider  Sabra Heck      Encounter Date: 12/13/2017  Check In: Session Check In - 12/13/17 1020      Check-In   Location  [facility]-Cardiac & Pulmonary Rehab    Staff Present  Nada Maclachlan, BA, ACSM CEP, Exercise Physiologist;Kelly Amedeo Plenty, BS, ACSM CEP, Exercise Physiologist;Patina Spanier Flavia Shipper    Supervising physician immediately available to respond to emergencies  [facility] immediately available ER MD    Physician(s)  Jimmye Norman and Alfred Levins    Medication changes reported      No    Fall or balance concerns reported     No    Tobacco Cessation  No Change patient does not smoke    Warm-up and Cool-down  Performed as group-led instruction    Resistance Training Performed  Yes    VAD Patient?  No      Pain Assessment   Currently in Pain?  No/denies          Social History   Tobacco Use  Smoking Status Former Smoker  . Packs/day: 3.50  . Years: 35.00  . Pack years: 122.50  . Types: Cigarettes  . Last attempt to quit: 07/06/1987  . Years since quitting: 30.4  Smokeless Tobacco Former Systems developer  . Types: Chew  . Quit date: 07/05/2002    Goals Met:  Independence with exercise equipment Exercise tolerated well No report of cardiac concerns or symptoms Strength training completed today  Goals Unmet:  Not Applicable  Comments: Pt able to follow exercise prescription today without complaint.  Will continue to monitor for progression.   Dr. Emily Filbert is Medical Director for [place] and [facility].

## 2017-12-15 DIAGNOSIS — J449 Chronic obstructive pulmonary disease, unspecified: Secondary | ICD-10-CM

## 2017-12-15 NOTE — Progress Notes (Signed)
Daily Session Note  Patient Details  Name: Jerry Jimenez MRN: [id number] Date of Birth: [birth year] Referring Provider:     Pulmonary Rehab from 11/30/2017 in [facility] Cardiac and Pulmonary Rehab  Referring Provider  Sabra Heck      Encounter Date: 12/15/2017  Check In: Session Check In - 12/15/17 1018      Check-In   Location  [facility]Cardiac [facility] Pulmonary Rehab    Staff Present  Nada Maclachlan, BA, ACSM CEP, Exercise Physiologist;Jessica Luan Pulling, MA, RCEP, CCRP, Exercise Physiologist;Ana Woodroof Flavia Shipper    Supervising physician immediately available to respond to emergencies  [facility] immediately available ER MD    Physician(s)  Dr. Reita Cliche and Quentin Cornwall    Medication changes reported      No    Fall or balance concerns reported     No    Warm-up and Cool-down  Performed as group-led instruction    Resistance Training Performed  Yes    VAD Patient?  No      Pain Assessment   Currently in Pain?  No/denies          Social History   Tobacco Use  Smoking Status Former Smoker  . Packs/day: 3.50  . Years: 35.00  . Pack years: 122.50  . Types: Cigarettes  . Last attempt to quit: 07/06/1987  . Years since quitting: 30.4  Smokeless Tobacco Former Systems developer  . Types: Chew  . Quit date: 07/05/2002    Goals Met:  Independence with exercise equipment Exercise tolerated well No report of cardiac concerns or symptoms Strength training completed today  Goals Unmet:  Not Applicable  Comments: Pt able to follow exercise prescription today without complaint.  Will continue to monitor for progression.   Dr. Emily Filbert is Medical Director for [place] and [facility].

## 2017-12-17 DIAGNOSIS — J449 Chronic obstructive pulmonary disease, unspecified: Secondary | ICD-10-CM

## 2017-12-17 NOTE — Progress Notes (Signed)
Daily Session Note  Patient Details  Name: Jerry Jimenez MRN: [id number] Date of Birth: 1934-07-28 Referring Provider:     Pulmonary Rehab from 11/30/2017 in [facility] Cardiac and Pulmonary Rehab  Referring Provider  Sabra Heck      Encounter Date: 12/17/2017  Check In: Session Check In - 12/17/17 1032      Check-In   Location  [facility]-Cardiac & Pulmonary Rehab    Staff Present  Renita Papa, RN BSN;Mandi Pyatt, BS, PEC;Hebert Dooling Loving    Supervising physician immediately available to respond to emergencies  [facility] immediately available ER MD    Physician(s)  Dr. Jacqualine Code and [facility]    Medication changes reported      No    Fall or balance concerns reported     No    Warm-up and Cool-down  Performed as group-led instruction    Resistance Training Performed  Yes    VAD Patient?  No      Pain Assessment   Currently in Pain?  No/denies          Social History   Tobacco Use  Smoking Status Former Smoker  . Packs/day: 3.50  . Years: 35.00  . Pack years: 122.50  . Types: Cigarettes  . Last attempt to quit: 07/06/1987  . Years since quitting: 30.4  Smokeless Tobacco Former Systems developer  . Types: Chew  . Quit date: 07/05/2002    Goals Met:  Independence with exercise equipment Exercise tolerated well No report of cardiac concerns or symptoms Strength training completed today  Goals Unmet:  Not Applicable  Comments: Pt able to follow exercise prescription today without complaint.  Will continue to monitor for progression.   Dr. Emily Filbert is Medical Director for [place] and [facility].

## 2017-12-20 DIAGNOSIS — J449 Chronic obstructive pulmonary disease, unspecified: Secondary | ICD-10-CM

## 2017-12-20 NOTE — Progress Notes (Signed)
Daily Session Note  Patient Details  Name: Jerry Jimenez MRN: [id number] Date of Birth: 1934/01/11 Referring Provider:     Pulmonary Rehab from 11/30/2017 in [facility] Cardiac and Pulmonary Rehab  Referring Provider  Sabra Heck      Encounter Date: 12/20/2017  Check In: Session Check In - 12/20/17 1116      Check-In   Location  [facility]Cardiac [facility] Pulmonary Rehab    Staff Present  Earlean Shawl, BS, ACSM CEP, Exercise Physiologist;Amanda Oletta Darter, BA, ACSM CEP, Exercise Physiologist;Bexley [place] Flavia Shipper    Supervising physician immediately available to respond to emergencies  [facility] immediately available ER MD    Physician(s)  Dr. Reita Cliche and Mariea Clonts    Medication changes reported      No    Fall or balance concerns reported     No    Warm-up and Cool-down  Performed as group-led instruction    Resistance Training Performed  Yes    VAD Patient?  No      Pain Assessment   Currently in Pain?  No/denies          Social History   Tobacco Use  Smoking Status Former Smoker  . Packs/day: 3.50  . Years: 35.00  . Pack years: 122.50  . Types: Cigarettes  . Last attempt to quit: 07/06/1987  . Years since quitting: 30.4  Smokeless Tobacco Former Systems developer  . Types: Chew  . Quit date: 07/05/2002    Goals Met:  Independence with exercise equipment Exercise tolerated well No report of cardiac concerns or symptoms Strength training completed today  Goals Unmet:  Not Applicable  Comments: Pt able to follow exercise prescription today without complaint.  Will continue to monitor for progression.   Dr. Emily Filbert is Medical Director for [place] and [facility].

## 2017-12-22 DIAGNOSIS — J449 Chronic obstructive pulmonary disease, unspecified: Secondary | ICD-10-CM

## 2017-12-22 NOTE — Progress Notes (Signed)
Daily Session Note  Patient Details  Name: Jerry Jimenez MRN: [id number] Date of Birth: 09-18-1934 Referring Provider:     Pulmonary Rehab from 11/30/2017 in [facility] Cardiac and Pulmonary Rehab  Referring Provider  Sabra Heck      Encounter Date: 12/22/2017  Check In: Session Check In - 12/22/17 1113      Check-In   Location  [facility]Cardiac & Pulmonary Rehab    Staff Present  Nada Maclachlan, BA, ACSM CEP, Exercise Physiologist;Haleem Hanner Darrin Nipper, [place], RCEP, CCRP, Exercise Physiologist    Supervising physician immediately available to respond to emergencies  [facility] immediately available ER MD    Physician(s)  Dr. Reita Cliche and Jimmye Norman    Medication changes reported      No    Fall or balance concerns reported     No    Tobacco Cessation  No Change    Warm-up and Cool-down  Performed as group-led instruction    Resistance Training Performed  Yes    VAD Patient?  No      Pain Assessment   Currently in Pain?  No/denies          Social History   Tobacco Use  Smoking Status Former Smoker  . Packs/day: 3.50  . Years: 35.00  . Pack years: 122.50  . Types: Cigarettes  . Last attempt to quit: 07/06/1987  . Years since quitting: 30.4  Smokeless Tobacco Former Systems developer  . Types: Chew  . Quit date: 07/05/2002    Goals Met:  Independence with exercise equipment Exercise tolerated well No report of cardiac concerns or symptoms Strength training completed today  Goals Unmet:  Not Applicable  Comments: Reviewed home exercise with pt today.  Pt plans to use TM at home for exercise.  Reviewed THR, pulse, RPE, sign and symptoms, NTG use, and when to call 911 or MD.  Also discussed weather considerations and indoor options.  Pt voiced understanding.   Dr. Emily Filbert is Medical Director for [place] and [facility].

## 2017-12-24 ENCOUNTER — Encounter: Payer: Medicare Other | Attending: Pulmonary Disease | Admitting: *Deleted

## 2017-12-24 DIAGNOSIS — J449 Chronic obstructive pulmonary disease, unspecified: Secondary | ICD-10-CM | POA: Diagnosis not present

## 2017-12-24 DIAGNOSIS — Z87891 Personal history of nicotine dependence: Secondary | ICD-10-CM | POA: Insufficient documentation

## 2017-12-24 NOTE — Progress Notes (Signed)
Daily Session Note  Patient Details  Name: Jerry Jimenez MRN: [id number] Date of Birth: Nov 15, 1933 Referring Provider:     Pulmonary Rehab from 11/30/2017 in [facility] Cardiac and Pulmonary Rehab  Referring Provider  Sabra Heck      Encounter Date: 12/24/2017  Check In: Session Check In - 12/24/17 1015      Check-In   Location  [facility]Cardiac & Pulmonary Rehab    Staff Present  Renita Papa, RN Vickki Hearing, BA, ACSM CEP, Exercise Physiologist;Krista Frederico Hamman, RN BSN    Supervising physician immediately available to respond to emergencies  [facility] immediately available ER MD    Physician(s)  Dr. Kerman Passey and Clearnce Hasten    Medication changes reported      No    Fall or balance concerns reported     No    Warm-up and Cool-down  Performed as group-led instruction    Resistance Training Performed  Yes    VAD Patient?  No      Pain Assessment   Currently in Pain?  No/denies          Social History   Tobacco Use  Smoking Status Former Smoker  . Packs/day: 3.50  . Years: 35.00  . Pack years: 122.50  . Types: Cigarettes  . Last attempt to quit: 07/06/1987  . Years since quitting: 30.4  Smokeless Tobacco Former Systems developer  . Types: Chew  . Quit date: 07/05/2002    Goals Met:  Proper associated with RPD/PD & O2 Sat Independence with exercise equipment Using PLB without cueing & demonstrates good technique Exercise tolerated well Strength training completed today  Goals Unmet:  Not Applicable  Comments: Pt able to follow exercise prescription today without complaint.  Will continue to monitor for progression.    Dr. Emily Filbert is Medical Director for [place] and [facility].

## 2017-12-27 ENCOUNTER — Encounter: Payer: Medicare Other | Admitting: *Deleted

## 2017-12-27 DIAGNOSIS — J449 Chronic obstructive pulmonary disease, unspecified: Secondary | ICD-10-CM | POA: Diagnosis not present

## 2017-12-27 NOTE — Progress Notes (Signed)
Daily Session Note  Patient Details  Name: Jerry Jimenez MRN: [id number] Date of Birth: [birth year] Referring Provider:     Pulmonary Rehab from 11/30/2017 in [facility] Cardiac and Pulmonary Rehab  Referring Provider  Sabra Heck      Encounter Date: 12/27/2017  Check In: Session Check In - 12/27/17 1021      Check-In   Location  [facility]Cardiac [facility] Pulmonary Rehab    Staff Present  Earlean Shawl, BS, ACSM CEP, Exercise Physiologist;Joseph Foy [place], [place], ACSM CEP, Exercise Physiologist    Supervising physician immediately available to respond to emergencies  [facility] immediately available ER MD    Physician(s)  Dr. Jimmye Norman and Burlene Arnt    Medication changes reported      No    Fall or balance concerns reported     No    Tobacco Cessation  No Change    Warm-up and Cool-down  Performed as group-led instruction    Resistance Training Performed  Yes    VAD Patient?  No      Pain Assessment   Currently in Pain?  No/denies          Social History   Tobacco Use  Smoking Status Former Smoker  . Packs/day: 3.50  . Years: 35.00  . Pack years: 122.50  . Types: Cigarettes  . Last attempt to quit: 07/06/1987  . Years since quitting: 30.4  Smokeless Tobacco Former Systems developer  . Types: Chew  . Quit date: 07/05/2002    Goals Met:  Proper associated with RPD/PD & O2 Sat Exercise tolerated well Personal goals reviewed No report of cardiac concerns or symptoms Strength training completed today  Goals Unmet:  Not Applicable  Comments: First full day of exercise!  Patient was oriented to gym and equipment including functions, settings, policies, and procedures.  Patient's individual exercise prescription and treatment plan were reviewed.  All starting workloads were established based on the results of the 6 minute walk test done at initial orientation visit.  The plan for exercise progression was also introduced and progression will be customized based on patient's performance and  goals.    Dr. Emily Filbert is Medical Director for [place] and [facility].

## 2017-12-27 NOTE — Progress Notes (Signed)
Daily Session Note  Patient Details  Name: Jerry Jimenez MRN: [id number] Date of Birth: 1933/11/08 Referring Provider:     Pulmonary Rehab from 11/30/2017 in [facility] Cardiac and Pulmonary Rehab  Referring Provider  Sabra Heck      Encounter Date: 12/27/2017  Check In: Session Check In - 12/27/17 1021      Check-In   Location  [facility]Cardiac [facility] Pulmonary Rehab    Staff Present  Earlean Shawl, BS, ACSM CEP, Exercise Physiologist;Amariyah Bazar Foy [place], [place], ACSM CEP, Exercise Physiologist    Supervising physician immediately available to respond to emergencies  [facility] immediately available ER MD    Physician(s)  Dr. Jimmye Norman and Burlene Arnt    Medication changes reported      No    Fall or balance concerns reported     No    Tobacco Cessation  No Change    Warm-up and Cool-down  Performed as group-led instruction    Resistance Training Performed  Yes    VAD Patient?  No      Pain Assessment   Currently in Pain?  No/denies          Social History   Tobacco Use  Smoking Status Former Smoker  . Packs/day: 3.50  . Years: 35.00  . Pack years: 122.50  . Types: Cigarettes  . Last attempt to quit: 07/06/1987  . Years since quitting: 30.4  Smokeless Tobacco Former Systems developer  . Types: Chew  . Quit date: 07/05/2002    Goals Met:  Independence with exercise equipment Exercise tolerated well Personal goals reviewed No report of cardiac concerns or symptoms Strength training completed today  Goals Unmet:  Not Applicable  Comments: Pt able to follow exercise prescription today without complaint.  Will continue to monitor for progression.   Dr. Emily Filbert is Medical Director for [place] and [facility].

## 2017-12-29 DIAGNOSIS — J449 Chronic obstructive pulmonary disease, unspecified: Secondary | ICD-10-CM | POA: Diagnosis not present

## 2017-12-29 NOTE — Progress Notes (Signed)
Daily Session Note  Patient Details  Name: Jerry Jimenez MRN: [id number] Date of Birth: 07-20-1934 Referring Provider:     Pulmonary Rehab from 11/30/2017 in [facility] Cardiac and Pulmonary Rehab  Referring Provider  Sabra Heck      Encounter Date: 12/29/2017  Check In: Session Check In - 12/29/17 1015      Check-In   Location  [facility]Cardiac [facility] Pulmonary Rehab    Staff Present  Nada Maclachlan, BA, ACSM CEP, Exercise Physiologist;Joseph Darrin Ni[place], [place], RCEP, CCRP, Exercise Physiologist    Supervising physician immediately available to respond to emergencies  [facility] immediately available ER MD    Physician(s)  Dr. Joni Fears and Jimmye Norman    Medication changes reported      No    Fall or balance concerns reported     No    Tobacco Cessation  No Change    Warm-up and Cool-down  Performed as group-led instruction    Resistance Training Performed  Yes    VAD Patient?  No      Pain Assessment   Currently in Pain?  No/denies          Social History   Tobacco Use  Smoking Status Former Smoker  . Packs/day: 3.50  . Years: 35.00  . Pack years: 122.50  . Types: Cigarettes  . Last attempt to quit: 07/06/1987  . Years since quitting: 30.5  Smokeless Tobacco Former Systems developer  . Types: Chew  . Quit date: 07/05/2002    Goals Met:  Independence with exercise equipment Exercise tolerated well No report of cardiac concerns or symptoms Strength training completed today  Goals Unmet:  Not Applicable  Comments: Pt able to follow exercise prescription today without complaint.  Will continue to monitor for progression.   Dr. Emily Filbert is Medical Director for [name] and [facility].

## 2017-12-31 ENCOUNTER — Encounter: Payer: Medicare Other | Admitting: *Deleted

## 2017-12-31 DIAGNOSIS — J449 Chronic obstructive pulmonary disease, unspecified: Secondary | ICD-10-CM | POA: Diagnosis not present

## 2017-12-31 NOTE — Progress Notes (Signed)
Daily Session Note  Patient Details  Name: Jerry Jimenez MRN: [id number] Date of Birth: [birth year] Referring Provider:     Pulmonary Rehab from 11/30/2017 in [facility] Cardiac and Pulmonary Rehab  Referring Provider  Sabra Heck      Encounter Date: 12/31/2017  Check In: Session Check In - 12/31/17 1034      Check-In   Location  [facility]    Staff Present  Renita Papa, RN Vickki Hearing, BA, ACSM CEP, Exercise Physiologist;Mary Kellie Shropshire, RN, BSN, MA    Supervising physician immediately available to respond to emergencies  [facility] immediately available ER MD    Physician(s)   Dr. Reita Cliche and Archie Balboa    Medication changes reported      No    Fall or balance concerns reported     No    Warm-up and Cool-down  Performed on first and last piece of equipment    Resistance Training Performed  Yes    VAD Patient?  No      Pain Assessment   Currently in Pain?  No/denies          Social History   Tobacco Use  Smoking Status Former Smoker  . Packs/day: 3.50  . Years: 35.00  . Pack years: 122.50  . Types: Cigarettes  . Last attempt to quit: 07/06/1987  . Years since quitting: 30.5  Smokeless Tobacco Former Systems developer  . Types: Chew  . Quit date: 07/05/2002    Goals Met:  Proper associated with RPD/PD & O2 Sat Independence with exercise equipment Using PLB without cueing & demonstrates good technique Exercise tolerated well Strength training completed today  Goals Unmet:  Not Applicable  Comments: Pt able to follow exercise prescription today without complaint.  Will continue to monitor for progression.    Dr. Emily Filbert is Medical Director for [place] and [facility].

## 2018-01-03 DIAGNOSIS — J449 Chronic obstructive pulmonary disease, unspecified: Secondary | ICD-10-CM | POA: Diagnosis not present

## 2018-01-03 NOTE — Progress Notes (Signed)
Pulmonary Individual Treatment Plan  Patient Details  Name: Jerry Jimenez MRN: [id number] Date of Birth: [birth year] Referring Provider:     Pulmonary Rehab from 11/30/2017 in [facility] Cardiac and Pulmonary Rehab  Referring Provider  Sabra Heck      Initial Encounter Date:    Pulmonary Rehab from 11/30/2017 in [facility] Cardiac and Pulmonary Rehab  Date  11/30/17  Referring Provider  Sabra Heck      Visit Diagnosis: Chronic obstructive pulmonary disease, unspecified COPD type ([place])  Patient's Home Medications on Admission:  Current Outpatient Medications:  .  aspirin EC 81 MG tablet, Take 81 mg by mouth daily., Disp: , Rfl:  .  cephALEXin (KEFLEX) 500 MG capsule, Take 1 capsule (500 mg total) by mouth every 12 (twelve) hours. (Patient not taking: Reported on 08/28/2017), Disp: 10 capsule, Rfl: 0 .  cholecalciferol (VITAMIN D) 1000 units tablet, Take 1,000 Units by mouth daily., Disp: , Rfl:  .  cyanocobalamin 500 MCG tablet, Take 500 mcg by mouth daily., Disp: , Rfl:  .  doxycycline (VIBRAMYCIN) 100 MG capsule, Take 100 mg by mouth 2 (two) times daily., Disp: , Rfl:  .  ELIQUIS 5 MG TABS tablet, Take 1 tablet (5 mg total) by mouth 2 (two) times daily. Patient advised to discuss with primary care physician before starting Eliquis.  It has been on hold for 4 weeks, Disp: 60 tablet, Rfl: 2 .  furosemide (LASIX) 40 MG tablet, Take 40 mg by mouth daily., Disp: , Rfl:  .  gabapentin (NEURONTIN) 100 MG capsule, Take 100 mg by mouth at bedtime., Disp: , Rfl:  .  glimepiride (AMARYL) 2 MG tablet, Take 1 tablet by mouth daily., Disp: , Rfl: 10 .  ipratropium-albuterol (DUONEB) 0.5-2.5 (3) MG/3ML SOLN, Take 3 mLs by nebulization every 6 (six) hours as needed., Disp: 360 mL, Rfl: 1 .  LORazepam (ATIVAN) 0.5 MG tablet, Take 1 tablet (0.5 mg total) every 8 (eight) hours as needed by mouth for anxiety., Disp: 15 tablet, Rfl: 0 .  magnesium oxide (MAG-OX) 400 MG tablet, Take 400 mg by mouth daily., Disp: , Rfl:  .   Melatonin 5 MG TABS, Take 1 tablet by mouth at bedtime., Disp: , Rfl:  .  montelukast (SINGULAIR) 10 MG tablet, Take 1 tablet by mouth every evening. , Disp: , Rfl: 2 .  multivitamin-lutein (OCUVITE-LUTEIN) CAPS capsule, Take 1 capsule by mouth daily., Disp: , Rfl:  .  omeprazole (PRILOSEC) 20 MG capsule, Take 1 capsule by mouth daily., Disp: , Rfl: 0 .  pramipexole (MIRAPEX) 0.5 MG tablet, Take 1 tablet by mouth 2 (two) times daily., Disp: , Rfl: 0 .  predniSONE (DELTASONE) 10 MG tablet, Take 50 mg daily.  Taper by 10 mg daily then stop. (Patient not taking: Reported on 08/28/2017), Disp: 15 tablet, Rfl: 0 .  PROAIR HFA 108 (90 Base) MCG/ACT inhaler, Inhale 2 puffs into the lungs every 6 (six) hours as needed., Disp: , Rfl: 11 .  simvastatin (ZOCOR) 20 MG tablet, Take 1 tablet by mouth daily at 6 PM. , Disp: , Rfl: 2 .  STIOLTO RESPIMAT 2.5-2.5 MCG/ACT AERS, Inhale 2 puffs into the lungs every morning. , Disp: , Rfl: 10 .  temazepam (RESTORIL) 30 MG capsule, Take 1 capsule by mouth daily., Disp: , Rfl: 0 .  traMADol (ULTRAM) 50 MG tablet, 100 mg every 6 (six) hours as needed. , Disp: , Rfl: 0  Past Medical History: Past Medical History:  Diagnosis Date  . CAD (coronary artery  disease)   . CHF (congestive heart failure) ([place])   . COPD (chronic obstructive pulmonary disease) ([place])   . Hypertension     Tobacco Use: Social History   Tobacco Use  Smoking Status Former Smoker  . Packs/day: 3.50  . Years: 35.00  . Pack years: 122.50  . Types: Cigarettes  . Last attempt to quit: 07/06/1987  . Years since quitting: 30.5  Smokeless Tobacco Former Systems developer  . Types: Chew  . Quit date: 07/05/2002    Labs: Recent Review Flowsheet Data    Labs for ITP Cardiac and Pulmonary Rehab Latest Ref Rng & Units 09/21/2017   PHART 7.350 - 7.450 7.44   PCO2ART 32.0 - 48.0 mmHg 41   HCO3 20.0 - 28.0 mmol/L 27.8   O2SAT % 94.0       Pulmonary Assessment Scores: Pulmonary Assessment Scores    Row Name  11/30/17 1121         ADL UCSD   ADL Phase  Entry     SOB Score total  51     Rest  2     Walk  3     Stairs  4     Bath  0     Dress  3     Shop  2       CAT Score   CAT Score  17       mMRC Score   mMRC Score  2        Pulmonary Function Assessment: Pulmonary Function Assessment - 11/30/17 1124      Pulmonary Function Tests   FVC%  92 % test done on 09/21/17    FEV1%  70 %    FEV1/FVC Ratio  59      Breath   Bilateral Breath Sounds  Clear    Shortness of Breath  Yes;Limiting activity       Exercise Target Goals:    Exercise Program Goal: Individual exercise prescription set using results from initial 6 min walk test and THRR while considering  patient's activity barriers and safety.    Exercise Prescription Goal: Initial exercise prescription builds to 30-45 minutes a day of aerobic activity, 2-3 days per week.  Home exercise guidelines will be given to patient during program as part of exercise prescription that the participant will acknowledge.  Activity Barriers & Risk Stratification:   6 Minute Walk: 6 Minute Walk    Row Name 11/30/17 1306         6 Minute Walk   Distance  1186 feet     Walk Time  6 minutes     # of Rest Breaks  0     MPH  2.25     METS  2.09     RPE  13     Perceived Dyspnea   2     VO2 Peak  7.33     Symptoms  No     Resting HR  77 bpm     Resting BP  128/66     Resting Oxygen Saturation   92 %     Exercise Oxygen Saturation  during 6 min walk  92 %     Max Ex. HR  107 bpm     Max Ex. BP  178/82     2 Minute Post BP  164/84       Interval HR   2 Minute HR  88     3 Minute HR  93  4 Minute HR  96     5 Minute HR  105     6 Minute HR  107     2 Minute Post HR  85       Interval Oxygen   Baseline Oxygen Saturation %  92 %     1 Minute Liters of Oxygen  0 L     2 Minute Oxygen Saturation %  93 %     2 Minute Liters of Oxygen  0 L     3 Minute Oxygen Saturation %  99 %     3 Minute Liters of Oxygen  0 L      4 Minute Oxygen Saturation %  93 %     4 Minute Liters of Oxygen  0 L     5 Minute Oxygen Saturation %  93 %     6 Minute Oxygen Saturation %  92 %     6 Minute Liters of Oxygen  0 L     2 Minute Post Oxygen Saturation %  95 %     2 Minute Post Liters of Oxygen  0 L       Oxygen Initial Assessment: Oxygen Initial Assessment - 11/30/17 1128      Home Oxygen   Home Oxygen Device  None    Sleep Oxygen Prescription  None    Home Exercise Oxygen Prescription  None    Home at Rest Exercise Oxygen Prescription  None      Initial 6 min Walk   Oxygen Used  None      Program Oxygen Prescription   Program Oxygen Prescription  None      Intervention   Short Term Goals  To learn and understand importance of maintaining oxygen saturations>88%;To learn and demonstrate proper use of respiratory medications;To learn and demonstrate proper pursed lip breathing techniques or other breathing techniques.;To learn and understand importance of monitoring SPO2 with pulse oximeter and demonstrate accurate use of the pulse oximeter.    Long  Term Goals  Verbalizes importance of monitoring SPO2 with pulse oximeter and return demonstration;Exhibits proper breathing techniques, such as pursed lip breathing or other method taught during program session;Demonstrates proper use of MDI's;Compliance with respiratory medication;Maintenance of O2 saturations>88%       Oxygen Re-Evaluation: Oxygen Re-Evaluation    Row Name 12/10/17 1014             Goals/Expected Outcomes   Short Term Goals  To learn and demonstrate proper pursed lip breathing techniques or other breathing techniques.;To learn and understand importance of monitoring SPO2 with pulse oximeter and demonstrate accurate use of the pulse oximeter.;To learn and exhibit compliance with exercise, home and travel O2 prescription;To learn and understand importance of maintaining oxygen saturations>88%       Long  Term Goals  Maintenance of O2  saturations>88%;Exhibits proper breathing techniques, such as pursed lip breathing or other method taught during program session;Verbalizes importance of monitoring SPO2 with pulse oximeter and return demonstration       Comments  Reviewed PLB technique with pt.  Talked about how it work and it's important to maintaining his exercise saturations.         Goals/Expected Outcomes  Short: Become more profiecient at using PLB.   Long: Become independent at using PLB.          Oxygen Discharge (Final Oxygen Re-Evaluation): Oxygen Re-Evaluation - 12/10/17 1014      Goals/Expected Outcomes   Short Term Goals  To learn and demonstrate proper pursed lip breathing techniques or other breathing techniques.;To learn and understand importance of monitoring SPO2 with pulse oximeter and demonstrate accurate use of the pulse oximeter.;To learn and exhibit compliance with exercise, home and travel O2 prescription;To learn and understand importance of maintaining oxygen saturations>88%    Long  Term Goals  Maintenance of O2 saturations>88%;Exhibits proper breathing techniques, such as pursed lip breathing or other method taught during program session;Verbalizes importance of monitoring SPO2 with pulse oximeter and return demonstration    Comments  Reviewed PLB technique with pt.  Talked about how it work and it's important to maintaining his exercise saturations.      Goals/Expected Outcomes  Short: Become more profiecient at using PLB.   Long: Become independent at using PLB.       Initial Exercise Prescription: Initial Exercise Prescription - 11/30/17 1300      Date of Initial Exercise RX and Referring Provider   Date  11/30/17    Referring Provider  Sabra Heck      Treadmill   MPH  2    Grade  0    Minutes  15    METs  2.53      Recumbant Bike   Level  2    RPM  60    Watts  15    Minutes  15    METs  2.4      NuStep   Level  2    SPM  80    Minutes  15    METs  2.4      T5 Nustep   Level   1    SPM  80    Minutes  15    METs  2.4      Prescription Details   Frequency (times per week)  3    Duration  Progress to 45 minutes of aerobic exercise without signs/symptoms of physical distress      Intensity   THRR 40-80% of Max Heartrate  102-126    Ratings of Perceived Exertion  11-15    Perceived Dyspnea  0-4      Resistance Training   Training Prescription  Yes    Weight  3 lb    Reps  10-15       Perform Capillary Blood Glucose checks as needed.  Exercise Prescription Changes:   Exercise Comments: Exercise Comments    Row Name 12/10/17 1012 12/22/17 1521 12/27/17 1024       Exercise Comments  First full day of exercise!  Patient was oriented to gym and equipment including functions, settings, policies, and procedures.  Patient's individual exercise prescription and treatment plan were reviewed.  All starting workloads were established based on the results of the 6 minute walk test done at initial orientation visit.  The plan for exercise progression was also introduced and progression will be customized based on patient's performance and goals.   Reviewed home exercise with pt today.  Pt plans to use TM at home for exercise.  Reviewed THR, pulse, RPE, sign and symptoms, NTG use, and when to call 911 or MD.  Also discussed weather considerations and indoor options.  Pt voiced understanding.   First full day of exercise!  Patient was oriented to gym and equipment including functions, settings, policies, and procedures.  Patient's individual exercise prescription and treatment plan were reviewed.  All starting workloads were established based on the results of the 6 minute walk test done at initial orientation visit.  The plan for exercise progression was also introduced and progression will be customized based on patient's performance and goals.        Exercise Goals and Review: Exercise Goals    Row Name 11/30/17 1305             Exercise Goals   Increase  Physical Activity  Yes       Intervention  Provide advice, education, support and counseling about physical activity/exercise needs.;Develop an individualized exercise prescription for aerobic and resistive training based on initial evaluation findings, risk stratification, comorbidities and participant's personal goals.       Expected Outcomes  Short Term: Attend rehab on a regular basis to increase amount of physical activity.;Long Term: Add in home exercise to make exercise part of routine and to increase amount of physical activity.;Long Term: Exercising regularly at least 3-5 days a week.       Increase Strength and Stamina  Yes       Intervention  Provide advice, education, support and counseling about physical activity/exercise needs.;Develop an individualized exercise prescription for aerobic and resistive training based on initial evaluation findings, risk stratification, comorbidities and participant's personal goals.       Expected Outcomes  Short Term: Increase workloads from initial exercise prescription for resistance, speed, and METs.;Short Term: Perform resistance training exercises routinely during rehab and add in resistance training at home;Long Term: Improve cardiorespiratory fitness, muscular endurance and strength as measured by increased METs and functional capacity (6MWT)       Able to understand and use rate of perceived exertion (RPE) scale  Yes       Intervention  Provide education and explanation on how to use RPE scale       Expected Outcomes  Short Term: Able to use RPE daily in rehab to express subjective intensity level;Long Term:  Able to use RPE to guide intensity level when exercising independently       Able to understand and use Dyspnea scale  Yes       Intervention  Provide education and explanation on how to use Dyspnea scale       Expected Outcomes  Short Term: Able to use Dyspnea scale daily in rehab to express subjective sense of shortness of breath during  exertion;Long Term: Able to use Dyspnea scale to guide intensity level when exercising independently       Knowledge and understanding of Target Heart Rate Range (THRR)  Yes       Intervention  Provide education and explanation of THRR including how the numbers were predicted and where they are located for reference       Expected Outcomes  Short Term: Able to state/look up THRR;Long Term: Able to use THRR to govern intensity when exercising independently;Short Term: Able to use daily as guideline for intensity in rehab       Able to check pulse independently  Yes       Intervention  Provide education and demonstration on how to check pulse in carotid and radial arteries.;Review the importance of being able to check your own pulse for safety during independent exercise       Expected Outcomes  Short Term: Able to explain why pulse checking is important during independent exercise;Long Term: Able to check pulse independently and accurately       Understanding of Exercise Prescription  Yes       Intervention  Provide education, explanation, and written materials on patient's individual exercise prescription  Expected Outcomes  Short Term: Able to explain program exercise prescription;Long Term: Able to explain home exercise prescription to exercise independently          Exercise Goals Re-Evaluation : Exercise Goals Re-Evaluation    Row Name 12/10/17 1013 12/22/17 1522           Exercise Goal Re-Evaluation   Exercise Goals Review  Able to understand and use rate of perceived exertion (RPE) scale;Knowledge and understanding of Target Heart Rate Range (THRR);Understanding of Exercise Prescription;Increase Physical Activity;Able to understand and use Dyspnea scale;Increase Strength and Stamina  Increase Physical Activity;Able to understand and use rate of perceived exertion (RPE) scale;Knowledge and understanding of Target Heart Rate Range (THRR);Increase Strength and Stamina;Able to understand  and use Dyspnea scale      Comments  Reviewed RPE scale, THR and program prescription with pt today.  Pt voiced understanding and was given a copy of goals to take home.    Reviewed home exercise with pt today.  Pt plans to use TM at home for exercise.  Reviewed THR, pulse, RPE, sign and symptoms, NTG use, and when to call 911 or MD.  Also discussed weather considerations and indoor options.  Pt voiced understanding.      Expected Outcomes  Short: Use RPE daily to regulate intensity.  Long: Follow program prescription in THR.  Short - Pt will add one day at home in addition to program sessions Long - Pt will exercise independently         Discharge Exercise Prescription (Final Exercise Prescription Changes):   Nutrition:  Target Goals: Understanding of nutrition guidelines, daily intake of sodium <1579m, cholesterol <2024m calories 30% from fat and 7% or less from saturated fats, daily to have 5 or more servings of fruits and vegetables.  Biometrics: Pre Biometrics - 11/30/17 1304      Pre Biometrics   Height  5' 10.25" (1.784 m)    Weight  235 lb 12.8 oz (107 kg)    Waist Circumference  45 inches    Hip Circumference  45.5 inches    Waist to Hip Ratio  0.99 %    BMI (Calculated)  33.61        Nutrition Therapy Plan and Nutrition Goals: Nutrition Therapy & Goals - 12/22/17 1036      Nutrition Therapy   Diet  TLC    Protein (specify units)  9oz    Fiber  37 grams    Saturated Fats  16 max. grams    Fruits and Vegetables  4 servings/day    Sodium  2000 grams      Personal Nutrition Goals   Nutrition Goal  Choose more "wholesome" snacks to help decrease hunger throughout the day, for example add a fruit / protein source    Personal Goal #2  Include sources of fiber and protein at each meal    Personal Goal #3  Increase intake of vegetables each week      Intervention Plan   Intervention  Prescribe, educate and counsel regarding individualized specific dietary modifications  aiming towards targeted core components such as weight, hypertension, lipid management, diabetes, heart failure and other comorbidities.    Expected Outcomes  Short Term Goal: Understand basic principles of dietary content, such as calories, fat, sodium, cholesterol and nutrients.;Short Term Goal: A plan has been developed with personal nutrition goals set during dietitian appointment.;Long Term Goal: Adherence to prescribed nutrition plan.       Nutrition Assessments: Nutrition Assessments - 11/30/17  1120      MEDFICTS Scores   Pre Score  60       Nutrition Goals Re-Evaluation: Nutrition Goals Re-Evaluation    Row Name 12/27/17 1028             Goals   Current Weight  230 lb 1.6 oz (104.4 kg)       Nutrition Goal  Lose weight and eat Healthier.       Comment  He has been eatring more fruit and vegetables. He takes vitamins but no protien of suppliments. He is going to work on eating supper earlier.       Expected Outcome  Short: eat dinner earlier. Long: maintain eating dinner earlier and not so close to bed time.          Nutrition Goals Discharge (Final Nutrition Goals Re-Evaluation): Nutrition Goals Re-Evaluation - 12/27/17 1028      Goals   Current Weight  230 lb 1.6 oz (104.4 kg)    Nutrition Goal  Lose weight and eat Healthier.    Comment  He has been eatring more fruit and vegetables. He takes vitamins but no protien of suppliments. He is going to work on eating supper earlier.    Expected Outcome  Short: eat dinner earlier. Long: maintain eating dinner earlier and not so close to bed time.       Psychosocial: Target Goals: Acknowledge presence or absence of significant depression and/or stress, maximize coping skills, provide positive support system. Participant is able to verbalize types and ability to use techniques and skills needed for reducing stress and depression.   Initial Review & Psychosocial Screening: Initial Psych Review & Screening - 11/30/17 1118       Initial Review   Current issues with  Current Sleep Concerns;Current Stress Concerns    Source of Stress Concerns  Unable to perform yard/household activities;Chronic Illness    Comments  he has been having trouble sleeping lately and gets short of breath at times.      Family Dynamics   Good Support System?  Yes    Comments  patient has 2 sons and a daughter that live within 10 miles of him.      Barriers   Psychosocial barriers to participate in program  The patient should benefit from training in stress management and relaxation.      Screening Interventions   Interventions  Encouraged to exercise;Provide feedback about the scores to participant;Program counselor consult;To provide support and resources with identified psychosocial needs    Expected Outcomes  Short Term goal: Utilizing psychosocial counselor, staff and physician to assist with identification of specific Stressors or current issues interfering with healing process. Setting desired goal for each stressor or current issue identified.;Long Term Goal: Stressors or current issues are controlled or eliminated.;Short Term goal: Identification and review with participant of any Quality of Life or Depression concerns found by scoring the questionnaire.;Long Term goal: The participant improves quality of Life and PHQ9 Scores as seen by post scores and/or verbalization of changes       Quality of Life Scores:  Scores of 19 and below usually indicate a poorer quality of life in these areas.  A difference of  2-3 points is a clinically meaningful difference.  A difference of 2-3 points in the total score of the Quality of Life Index has been associated with significant improvement in overall quality of life, self-image, physical symptoms, and general health in studies assessing change in quality of life.  PHQ-9: Recent Review Flowsheet Data    Depression screen [facility] 2/9 11/30/2017 07/20/2017   Decreased Interest 2 1   Down,  Depressed, Hopeless 0 0   PHQ - 2 Score 2 1   Altered sleeping 3 3   Tired, decreased energy 3 3   Change in appetite 0 3   Feeling bad or failure about yourself  0 0   Trouble concentrating 0 0   Moving slowly or fidgety/restless 0 0   Suicidal thoughts 0 0   PHQ-9 Score 8 10   Difficult doing work/chores Somewhat difficult Not difficult at all     Interpretation of Total Score  Total Score Depression Severity:  1-4 = Minimal depression, 5-9 = Mild depression, 10-14 = Moderate depression, 15-19 = Moderately severe depression, 20-27 = Severe depression   Psychosocial Evaluation and Intervention: Psychosocial Evaluation - 12/15/17 1043      Psychosocial Evaluation & Interventions   Interventions  Encouraged to exercise with the program and follow exercise prescription    Comments  Counselor met with Mr. [facility]) today for initial psychosocial evaluation.  He is an 84 year old who has COPD.  Bud has a strong support system with a spouse of 93 years and (3) adult children who live locally.  He has chronic sleep problems - although he reports sleeping "all night last night for the first time in 5 years!"  He takes an OTC Melatonin to help with this.  Bud states his appetite is "too good" and he has goals to lose weight while in this program.  He reports a history of anxiety and takes medication for this that he says is "helpful."  Bud is typically in a positive mood and has minimal stress in his life other than his health at this time.  He also has goals just to breathe better in general.  Staff will follow with Bud throughout the course of this program.      Expected Outcomes  Bud will benefit from consistent exercise to achieve his stated goals.  The educational and psychoeducational components of this program will be helpful in understanding and coping with his condition in a more positive way.  Staff will follow with him.     Continue Psychosocial Services   Follow up required by staff        Psychosocial Re-Evaluation: Psychosocial Re-Evaluation    Short Name 12/27/17 1023             Psychosocial Re-Evaluation   Current issues with  Current Stress Concerns;Current Sleep Concerns       Comments  He has not been sleeping to well lately. He takes melatonin to help him sleep but lately he has been waking up. Sometimes he has to get up to use the bathroom. He is going to try to stop eating and drinking before 730pm  to help reduce waking up in the middle of the night.       Expected Outcomes  Short: try not to eat or drink past 730 for sleep. Long: Maintain eating  before 730pm.       Interventions  Encouraged to attend Pulmonary Rehabilitation for the exercise          Psychosocial Discharge (Final Psychosocial Re-Evaluation): Psychosocial Re-Evaluation - 12/27/17 1023      Psychosocial Re-Evaluation   Current issues with  Current Stress Concerns;Current Sleep Concerns    Comments  He has not been sleeping to well lately. He takes melatonin to help him  sleep but lately he has been waking up. Sometimes he has to get up to use the bathroom. He is going to try to stop eating and drinking before 730pm  to help reduce waking up in the middle of the night.    Expected Outcomes  Short: try not to eat or drink past 730 for sleep. Long: Maintain eating  before 730pm.    Interventions  Encouraged to attend Pulmonary Rehabilitation for the exercise       Education: Education Goals: Education classes will be provided on a weekly basis, covering required topics. Participant will state understanding/return demonstration of topics presented.  Learning Barriers/Preferences: Learning Barriers/Preferences - 11/30/17 1125      Learning Barriers/Preferences   Learning Barriers  Reading;Sight wears glasses    Learning Preferences  Individual Instruction;Verbal Instruction;Video       Education Topics:  Initial Evaluation Education: - Verbal, written and demonstration of  respiratory meds, oximetry and breathing techniques. Instruction on use of nebulizers and MDIs and importance of monitoring MDI activations.   Pulmonary Rehab from 12/29/2017 in [facility] Cardiac and Pulmonary Rehab  Date  11/30/17  Educator  [facility]  Instruction Review Code  1- Verbalizes Understanding      General Nutrition Guidelines/Fats and Fiber: -Group instruction provided by verbal, written material, models and posters to present the general guidelines for heart healthy nutrition. Gives an explanation and review of dietary fats and fiber.   Pulmonary Rehab from 07/20/2017 in [facility] Cardiac and Pulmonary Rehab  Date  07/20/17  Educator  [facility]  Instruction Review Code  1- Verbalizes Understanding      Controlling Sodium/Reading Food Labels: -Group verbal and written material supporting the discussion of sodium use in heart healthy nutrition. Review and explanation with models, verbal and written materials for utilization of the food label.   Exercise Physiology & General Exercise Guidelines: - Group verbal and written instruction with models to review the exercise physiology of the cardiovascular system and associated critical values. Provides general exercise guidelines with specific guidelines to those with heart or lung disease.    Aerobic Exercise & Resistance Training: - Gives group verbal and written instruction on the various components of exercise. Focuses on aerobic and resistive training programs and the benefits of this training and how to safely progress through these programs.   Flexibility, Balance, Mind/Body Relaxation: Provides group verbal/written instruction on the benefits of flexibility and balance training, including mind/body exercise modes such as yoga, pilates and tai chi.  Demonstration and skill practice provided.   Stress and Anxiety: - Provides group verbal and written instruction about the health risks of elevated stress and causes of high stress.  Discuss the  correlation between heart/lung disease and anxiety and treatment options. Review healthy ways to manage with stress and anxiety.   Pulmonary Rehab from 12/29/2017 in [facility] Cardiac and Pulmonary Rehab  Date  12/29/17  Educator  [facility]  Instruction Review Code  1- Verbalizes Understanding      Depression: - Provides group verbal and written instruction on the correlation between heart/lung disease and depressed mood, treatment options, and the stigmas associated with seeking treatment.   Pulmonary Rehab from 12/29/2017 in [facility] Cardiac and Pulmonary Rehab  Date  12/15/17  Educator  [facility]  Instruction Review Code  1- Verbalizes Understanding      Exercise & Equipment Safety: - Individual verbal instruction and demonstration of equipment use and safety with use of the equipment.   Pulmonary Rehab from 12/29/2017 in [facility] Cardiac and Pulmonary  Rehab  Date  11/30/17  Educator  [facility]  Instruction Review Code  1- Verbalizes Understanding      Infection Prevention: - Provides verbal and written material to individual with discussion of infection control including proper hand washing and proper equipment cleaning during exercise session.   Pulmonary Rehab from 12/29/2017 in [facility] Cardiac and Pulmonary Rehab  Date  11/30/17  Educator  [facility]  Instruction Review Code  1- Verbalizes Understanding      Falls Prevention: - Provides verbal and written material to individual with discussion of falls prevention and safety.   Pulmonary Rehab from 12/29/2017 in [facility] Cardiac and Pulmonary Rehab  Date  11/30/17  Educator  [facility]  Instruction Review Code  1- Verbalizes Understanding      Diabetes: - Individual verbal and written instruction to review signs/symptoms of diabetes, desired ranges of glucose level fasting, after meals and with exercise. Advice that pre and post exercise glucose checks will be done for 3 sessions at entry of program.   Chronic Lung Diseases: - Group verbal and written instruction to review  updates, respiratory medications, advancements in procedures and treatments. Discuss use of supplemental oxygen including available portable oxygen systems, continuous and intermittent flow rates, concentrators, personal use and safety guidelines. Review proper use of inhaler and spacers. Provide informative websites for self-education.    Energy Conservation: - Provide group verbal and written instruction for methods to conserve energy, plan and organize activities. Instruct on pacing techniques, use of adaptive equipment and posture/positioning to relieve shortness of breath.   Pulmonary Rehab from 12/29/2017 in [facility] Cardiac and Pulmonary Rehab  Date  12/22/17  Educator  [facility]  Instruction Review Code  1- Verbalizes Understanding      Triggers and Exacerbations: - Group verbal and written instruction to review types of environmental triggers and ways to prevent exacerbations. Discuss weather changes, air quality and the benefits of nasal washing. Review warning signs and symptoms to help prevent infections. Discuss techniques for effective airway clearance, coughing, and vibrations.   AED/CPR: - Group verbal and written instruction with the use of models to demonstrate the basic use of the AED with the basic ABC's of resuscitation.   Pulmonary Rehab from 12/29/2017 in [facility] Cardiac and Pulmonary Rehab  Date  12/24/17  Educator  [facility]  Instruction Review Code  1- Actuary and Physiology of the Lungs: - Group verbal and written instruction with the use of models to provide basic lung anatomy and physiology related to function, structure and complications of lung disease.   Anatomy & Physiology of the Heart: - Group verbal and written instruction and models provide basic cardiac anatomy and physiology, with the coronary electrical and arterial systems. Review of Valvular disease and Heart Failure   Cardiac Medications: - Group verbal and written instruction to  review commonly prescribed medications for heart disease. Reviews the medication, class of the drug, and side effects.   Pulmonary Rehab from 12/29/2017 in [facility] Cardiac and Pulmonary Rehab  Date  12/10/17  Educator  [facility]  Instruction Review Code  1- Verbalizes Understanding      Know Your Numbers and Risk Factors: -Group verbal and written instruction about important numbers in your health.  Discussion of what are risk factors and how they play a role in the disease process.  Review of Cholesterol, Blood Pressure, Diabetes, and BMI and the role they play in your overall health.   Sleep Hygiene: -Provides group verbal and written instruction about  how sleep can affect your health.  Define sleep hygiene, discuss sleep cycles and impact of sleep habits. Review good sleep hygiene tips.    Other: -Provides group and verbal instruction on various topics (see comments)    Knowledge Questionnaire Score: Knowledge Questionnaire Score - 11/30/17 1125      Knowledge Questionnaire Score   Pre Score  16/18 reviewed with patient        Core Components/Risk Factors/Patient Goals at Admission: Personal Goals and Risk Factors at Admission - 11/30/17 1128      Core Components/Risk Factors/Patient Goals on Admission    Weight Management  Yes;Weight Loss    Intervention  Weight Management: Develop a combined nutrition and exercise program designed to reach desired caloric intake, while maintaining appropriate intake of nutrient and fiber, sodium and fats, and appropriate energy expenditure required for the weight goal.;Weight Management: Provide education and appropriate resources to help participant work on and attain dietary goals.;Weight Management/Obesity: Establish reasonable short term and long term weight goals.;Obesity: Provide education and appropriate resources to help participant work on and attain dietary goals.    Admit Weight  235 lb 12.8 oz (107 kg)    Goal Weight: Short Term  230 lb  (104.3 kg)    Goal Weight: Long Term  200 lb (90.7 kg)    Expected Outcomes  Short Term: Continue to assess and modify interventions until short term weight is achieved;Weight Maintenance: Understanding of the daily nutrition guidelines, which includes 25-35% calories from fat, 7% or less cal from saturated fats, less than 287m cholesterol, less than 1.5gm of sodium, & 5 or more servings of fruits and vegetables daily;Long Term: Adherence to nutrition and physical activity/exercise program aimed toward attainment of established weight goal;Weight Loss: Understanding of general recommendations for a balanced deficit meal plan, which promotes 1-2 lb weight loss per week and includes a negative energy balance of [phone] kcal/d;Understanding recommendations for meals to include 15-35% energy as protein, 25-35% energy from fat, 35-60% energy from carbohydrates, less than 2066mof dietary cholesterol, 20-35 gm of total fiber daily;Understanding of distribution of calorie intake throughout the day with the consumption of 4-5 meals/snacks    Improve shortness of breath with ADL's  Yes    Intervention  Provide education, individualized exercise plan and daily activity instruction to help decrease symptoms of SOB with activities of daily living.    Expected Outcomes  Short Term: Improve cardiorespiratory fitness to achieve a reduction of symptoms when performing ADLs;Long Term: Be able to perform more ADLs without symptoms or delay the onset of symptoms    Hypertension  Yes    Intervention  Provide education on lifestyle modifcations including regular physical activity/exercise, weight management, moderate sodium restriction and increased consumption of fresh fruit, vegetables, and low fat dairy, alcohol moderation, and smoking cessation.;Monitor prescription use compliance.    Expected Outcomes  Short Term: Continued assessment and intervention until BP is < 140/9075mG in hypertensive participants. < 130/58m38m  in hypertensive participants with diabetes, heart failure or chronic kidney disease.;Long Term: Maintenance of blood pressure at goal levels.    Lipids  Yes    Intervention  Provide education and support for participant on nutrition & aerobic/resistive exercise along with prescribed medications to achieve LDL <70mg77mL >40mg.63mExpected Outcomes  Short Term: Participant states understanding of desired cholesterol values and is compliant with medications prescribed. Participant is following exercise prescription and nutrition guidelines.;Long Term: Cholesterol controlled with medications as prescribed, with individualized exercise RX  and with personalized nutrition plan. Value goals: LDL < 32m, HDL > 40 mg.       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 His weight has gone down 5 pounds. He wants to continue with his weight loss. Skylur blood pressure is getting better since he started the program.       Expected Outcomes  Short: continue [facility] to lose more weight. 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 His weight has gone down 5 pounds. He wants to continue with his weight loss. Janai blood pressure is getting better since he started the program.    Expected Outcomes  Short: continue [facility] to lose  more weight. Long: maintain an exercise routine to continue weight loss       ITP Comments: ITP Comments    Row Name 11/30/17 1143 11/30/17 1144 12/06/17 0905 12/29/17 1026 01/03/18 0815   ITP Comments  Medical Evaluation completed. Chart sent for review and changes to Dr. MEmily FilbertDirector of [place] [facility]  Met face to face with Doctor MEmily Filbertdirector of [place] Chart Reviewed and signed.  30 day review completed. ITP sent to Dr. MEmily FilbertDirector of [place] Continue with ITP unless changes are made by physician.  Patient met with Dr MSabra Heckdirector of [place] face to face. Chart reviwed and signed. Continue with exercise prescription unless told otherwise.  30 day review completed. ITP sent to Dr. MEmily FilbertDirector of [place] Continue with ITP unless changes are made by physician.      Comments: 30 day review

## 2018-01-03 NOTE — Progress Notes (Signed)
Daily Session Note  Patient Details  Name: Jerry Jimenez MRN: [id number] Date of Birth: 07/19/1934 Referring Provider:     Pulmonary Rehab from 11/30/2017 in [facility] Cardiac and Pulmonary Rehab  Referring Provider  Sabra Heck      Encounter Date: 01/03/2018  Check In: Session Check In - 01/03/18 1010      Check-In   Location  [facility]-Cardiac & Pulmonary Rehab    Staff Present  Earlean Shawl, BS, ACSM CEP, Exercise Physiologist;Amanda Oletta Darter, BA, ACSM CEP, Exercise Physiologist;Keala Drum Flavia Shipper    Supervising physician immediately available to respond to emergencies  [facility] immediately available ER MD    Physician(s)  Dr. Corky Downs and Jimmye Norman    Medication changes reported      No    Fall or balance concerns reported     No    Tobacco Cessation  No Change    Warm-up and Cool-down  Performed as group-led instruction    Resistance Training Performed  Yes    VAD Patient?  No      Pain Assessment   Currently in Pain?  No/denies          Social History   Tobacco Use  Smoking Status Former Smoker  . Packs/day: 3.50  . Years: 35.00  . Pack years: 122.50  . Types: Cigarettes  . Last attempt to quit: 07/06/1987  . Years since quitting: 30.5  Smokeless Tobacco Former Systems developer  . Types: Chew  . Quit date: 07/05/2002    Goals Met:  Independence with exercise equipment Exercise tolerated well No report of cardiac concerns or symptoms Strength training completed today  Goals Unmet:  Not Applicable  Comments: Pt able to follow exercise prescription today without complaint.  Will continue to monitor for progression.   Dr. Emily Filbert is Medical Director for [place] and [facility] Pulmonary Rehabilitation.

## 2018-01-05 ENCOUNTER — Encounter: Payer: Medicare Other | Admitting: *Deleted

## 2018-01-05 DIAGNOSIS — J449 Chronic obstructive pulmonary disease, unspecified: Secondary | ICD-10-CM | POA: Diagnosis not present

## 2018-01-05 NOTE — Progress Notes (Signed)
Daily Session Note  Patient Details  Name: Jerry Jimenez MRN: [id number] Date of Birth: [birth year] Referring Provider:     Pulmonary Rehab from 11/30/2017 in [facility] Cardiac and Pulmonary Rehab  Referring Provider  Sabra Heck      Encounter Date: 01/05/2018  Check In: Session Check In - 01/05/18 1007      Check-In   Location  [facility]-Cardiac & Pulmonary Rehab    Staff Present  Alberteen Sam, MA, RCEP, CCRP, Exercise Physiologist;Amanda Oletta Darter, BA, ACSM CEP, Exercise Physiologist;Joseph Flavia Shipper    Supervising physician immediately available to respond to emergencies  [facility] immediately available ER MD    Physician(s)  Drs. Lord and [employer]    Medication changes reported      No    Fall or balance concerns reported     No    Warm-up and Cool-down  Performed as group-led Higher education careers adviser Performed  Yes    VAD Patient?  No      Pain Assessment   Currently in Pain?  No/denies          Social History   Tobacco Use  Smoking Status Former Smoker  . Packs/day: 3.50  . Years: 35.00  . Pack years: 122.50  . Types: Cigarettes  . Last attempt to quit: 07/06/1987  . Years since quitting: 30.5  Smokeless Tobacco Former Systems developer  . Types: Chew  . Quit date: 07/05/2002    Goals Met:  Proper associated with RPD/PD & O2 Sat Independence with exercise equipment Using PLB without cueing & demonstrates good technique Exercise tolerated well No report of cardiac concerns or symptoms Strength training completed today  Goals Unmet:  Not Applicable  Comments: Pt able to follow exercise prescription today without complaint.  Will continue to monitor for progression.    Dr. Emily Filbert is Medical Director for [place] and [facility] Pulmonary Rehabilitation.

## 2018-01-07 DIAGNOSIS — J449 Chronic obstructive pulmonary disease, unspecified: Secondary | ICD-10-CM | POA: Diagnosis not present

## 2018-01-07 NOTE — Progress Notes (Signed)
Daily Session Note  Patient Details  Name: Jerry Jimenez MRN: [id number] Date of Birth: 25-Jan-1934 Referring Provider:     Pulmonary Rehab from 11/30/2017 in [facility] Cardiac and Pulmonary Rehab  Referring Provider  Sabra Heck      Encounter Date: 01/07/2018  Check In: Session Check In - 01/07/18 1011      Check-In   Location  [facility]-Cardiac & Pulmonary Rehab    Staff Present  Renita Papa, RN BSN;Mandi Kingston, BS, PEC;[employer] [place]    Supervising physician immediately available to respond to emergencies  [facility] immediately available ER MD    Physician(s)  Dr. Burlene Arnt and Corky Downs    Medication changes reported      No    Fall or balance concerns reported     No    Tobacco Cessation  No Change    Warm-up and Cool-down  Performed as group-led instruction    Resistance Training Performed  Yes    VAD Patient?  No      Pain Assessment   Currently in Pain?  No/denies          Social History   Tobacco Use  Smoking Status Former Smoker  . Packs/day: 3.50  . Years: 35.00  . Pack years: 122.50  . Types: Cigarettes  . Last attempt to quit: 07/06/1987  . Years since quitting: 30.5  Smokeless Tobacco Former Systems developer  . Types: Chew  . Quit date: 07/05/2002    Goals Met:  Independence with exercise equipment Exercise tolerated well No report of cardiac concerns or symptoms Strength training completed today  Goals Unmet:  Not Applicable  Comments: Pt able to follow exercise prescription today without complaint.  Will continue to monitor for progression.   Dr. Emily Filbert is Medical Director for [place] and [facility] Pulmonary Rehabilitation.

## 2018-01-10 DIAGNOSIS — J449 Chronic obstructive pulmonary disease, unspecified: Secondary | ICD-10-CM

## 2018-01-10 NOTE — Progress Notes (Signed)
Daily Session Note  Patient Details  Name: Jerry Jimenez MRN: [id number] Date of Birth: 1934/07/28 Referring Provider:     Pulmonary Rehab from 11/30/2017 in [facility] Cardiac and Pulmonary Rehab  Referring Provider  Sabra Heck      Encounter Date: 01/10/2018  Check In: Session Check In - 01/10/18 0957      Check-In   Location  [facility]-Cardiac & Pulmonary Rehab    Staff Present  Earlean Shawl, BS, ACSM CEP, Exercise Physiologist;Amanda Oletta Darter, BA, ACSM CEP, Exercise Physiologist;Nayel Purdy Flavia Shipper    Supervising physician immediately available to respond to emergencies  [facility] immediately available ER MD    Physician(s)  Dr. Burlene Arnt and Corky Downs    Medication changes reported      No    Fall or balance concerns reported     No    Tobacco Cessation  No Change    Warm-up and Cool-down  Performed as group-led instruction    Resistance Training Performed  Yes    VAD Patient?  No      Pain Assessment   Currently in Pain?  No/denies          Social History   Tobacco Use  Smoking Status Former Smoker  . Packs/day: 3.50  . Years: 35.00  . Pack years: 122.50  . Types: Cigarettes  . Last attempt to quit: 07/06/1987  . Years since quitting: 30.5  Smokeless Tobacco Former Systems developer  . Types: Chew  . Quit date: 07/05/2002    Goals Met:  Independence with exercise equipment Exercise tolerated well No report of cardiac concerns or symptoms Strength training completed today  Goals Unmet:  Not Applicable  Comments: Pt able to follow exercise prescription today without complaint.  Will continue to monitor for progression.   Dr. Emily Filbert is Medical Director for [place] and [facility] Pulmonary Rehabilitation.

## 2018-01-12 DIAGNOSIS — J449 Chronic obstructive pulmonary disease, unspecified: Secondary | ICD-10-CM

## 2018-01-12 NOTE — Progress Notes (Signed)
Daily Session Note  Patient Details  Name: Jerry Jimenez MRN: [id number] Date of Birth: January 28, 1934 Referring Provider:     Pulmonary Rehab from 11/30/2017 in [facility] Cardiac and Pulmonary Rehab  Referring Provider  Sabra Heck      Encounter Date: 01/12/2018  Check In: Session Check In - 01/12/18 0954      Check-In   Location  [facility]-Cardiac & Pulmonary Rehab    Staff Present  Nada Maclachlan, BA, ACSM CEP, Exercise Physiologist;Nima Kemppainen Darrin Nipper, [place], RCEP, [place], Exercise Physiologist    Supervising physician immediately available to respond to emergencies  [facility] immediately available ER MD    Physician(s)  Dr. Joni Fears and Jimmye Norman    Medication changes reported      No    Fall or balance concerns reported     No    Tobacco Cessation  No Change    Warm-up and Cool-down  Performed as group-led instruction    Resistance Training Performed  Yes    VAD Patient?  No      Pain Assessment   Currently in Pain?  No/denies          Social History   Tobacco Use  Smoking Status Former Smoker  . Packs/day: 3.50  . Years: 35.00  . Pack years: 122.50  . Types: Cigarettes  . Last attempt to quit: 07/06/1987  . Years since quitting: 30.5  Smokeless Tobacco Former Systems developer  . Types: Chew  . Quit date: 07/05/2002    Goals Met:  Independence with exercise equipment Exercise tolerated well No report of cardiac concerns or symptoms Strength training completed today  Goals Unmet:  Not Applicable  Comments: Pt able to follow exercise prescription today without complaint.  Will continue to monitor for progression.   Dr. Emily Filbert is Medical Director for [place] and [facility]monary Rehabilitation.

## 2018-01-14 ENCOUNTER — Encounter: Payer: Medicare Other | Admitting: *Deleted

## 2018-01-14 DIAGNOSIS — J449 Chronic obstructive pulmonary disease, unspecified: Secondary | ICD-10-CM

## 2018-01-14 NOTE — Progress Notes (Signed)
Daily Session Note  Patient Details  Name: Jerry Jimenez MRN: [id number] Date of Birth: Nov 18, 1933 Referring Provider:     Pulmonary Rehab from 11/30/2017 in [facility]  Referring Provider  Sabra Heck      Encounter Date: 01/14/2018  Check In: Session Check In - 01/14/18 1010      Check-In   Location  [facility]    Staff Present  Renita Papa, RN Vickki Hearing, BA, ACSM CEP, Exercise Physiologist;Joseph Flavia Shipper    Supervising physician immediately available to respond to emergencies  [facility] immediately available ER MD    Physician(s)  Drs. Joni Fears and Tecumseh    Medication changes reported      No    Fall or balance concerns reported     No    Warm-up and Cool-down  Performed as group-led Higher education careers adviser Performed  Yes    VAD Patient?  No      Pain Assessment   Currently in Pain?  No/denies          Social History   Tobacco Use  Smoking Status Former Smoker  . Packs/day: 3.50  . Years: 35.00  . Pack years: 122.50  . Types: Cigarettes  . Last attempt to quit: 07/06/1987  . Years since quitting: 30.5  Smokeless Tobacco Former Systems developer  . Types: Chew  . Quit date: 07/05/2002    Goals Met:  Proper associated with RPD/PD & O2 Sat Independence with exercise equipment Using PLB without cueing & demonstrates good technique Exercise tolerated well No report of cardiac concerns or symptoms Strength training completed today  Goals Unmet:  Not Applicable  Comments: Pt able to follow exercise prescription today without complaint.  Will continue to monitor for progression.    Dr. Emily Filbert is Medical Director for [facility] and [facility].

## 2018-01-24 ENCOUNTER — Encounter: Payer: Medicare Other | Attending: Pulmonary Disease

## 2018-01-24 DIAGNOSIS — J449 Chronic obstructive pulmonary disease, unspecified: Secondary | ICD-10-CM | POA: Insufficient documentation

## 2018-01-24 DIAGNOSIS — Z87891 Personal history of nicotine dependence: Secondary | ICD-10-CM | POA: Insufficient documentation

## 2018-01-31 ENCOUNTER — Telehealth: Payer: Self-pay

## 2018-01-31 DIAGNOSIS — J449 Chronic obstructive pulmonary disease, unspecified: Secondary | ICD-10-CM

## 2018-01-31 NOTE — Progress Notes (Signed)
Pulmonary Individual Treatment Plan  Patient Details  Name: Jerry Jimenez MRN: [id number] Date of Birth: [birth year] Referring Provider:     Pulmonary Rehab from 11/30/2017 in [facility] Cardiac and Pulmonary Rehab  Referring Provider  Sabra Heck      Initial Encounter Date:    Pulmonary Rehab from 11/30/2017 in [facility] Cardiac and Pulmonary Rehab  Date  11/30/17  Referring Provider  Sabra Heck      Visit Diagnosis: Chronic obstructive pulmonary disease, unspecified COPD type ([place])  Patient's Home Medications on Admission:  Current Outpatient Medications:  .  aspirin EC 81 MG tablet, Take 81 mg by mouth daily., Disp: , Rfl:  .  cephALEXin (KEFLEX) 500 MG capsule, Take 1 capsule (500 mg total) by mouth every 12 (twelve) hours. (Patient not taking: Reported on 08/28/2017), Disp: 10 capsule, Rfl: 0 .  cholecalciferol (VITAMIN D) 1000 units tablet, Take 1,000 Units by mouth daily., Disp: , Rfl:  .  cyanocobalamin 500 MCG tablet, Take 500 mcg by mouth daily., Disp: , Rfl:  .  doxycycline (VIBRAMYCIN) 100 MG capsule, Take 100 mg by mouth 2 (two) times daily., Disp: , Rfl:  .  ELIQUIS 5 MG TABS tablet, Take 1 tablet (5 mg total) by mouth 2 (two) times daily. Patient advised to discuss with primary care physician before starting Eliquis.  It has been on hold for 4 weeks, Disp: 60 tablet, Rfl: 2 .  furosemide (LASIX) 40 MG tablet, Take 40 mg by mouth daily., Disp: , Rfl:  .  gabapentin (NEURONTIN) 100 MG capsule, Take 100 mg by mouth at bedtime., Disp: , Rfl:  .  glimepiride (AMARYL) 2 MG tablet, Take 1 tablet by mouth daily., Disp: , Rfl: 10 .  ipratropium-albuterol (DUONEB) 0.5-2.5 (3) MG/3ML SOLN, Take 3 mLs by nebulization every 6 (six) hours as needed., Disp: 360 mL, Rfl: 1 .  LORazepam (ATIVAN) 0.5 MG tablet, Take 1 tablet (0.5 mg total) every 8 (eight) hours as needed by mouth for anxiety., Disp: 15 tablet, Rfl: 0 .  magnesium oxide (MAG-OX) 400 MG tablet, Take 400 mg by mouth daily., Disp: , Rfl:  .   Melatonin 5 MG TABS, Take 1 tablet by mouth at bedtime., Disp: , Rfl:  .  montelukast (SINGULAIR) 10 MG tablet, Take 1 tablet by mouth every evening. , Disp: , Rfl: 2 .  multivitamin-lutein (OCUVITE-LUTEIN) CAPS capsule, Take 1 capsule by mouth daily., Disp: , Rfl:  .  omeprazole (PRILOSEC) 20 MG capsule, Take 1 capsule by mouth daily., Disp: , Rfl: 0 .  pramipexole (MIRAPEX) 0.5 MG tablet, Take 1 tablet by mouth 2 (two) times daily., Disp: , Rfl: 0 .  predniSONE (DELTASONE) 10 MG tablet, Take 50 mg daily.  Taper by 10 mg daily then stop. (Patient not taking: Reported on 08/28/2017), Disp: 15 tablet, Rfl: 0 .  PROAIR HFA 108 (90 Base) MCG/ACT inhaler, Inhale 2 puffs into the lungs every 6 (six) hours as needed., Disp: , Rfl: 11 .  simvastatin (ZOCOR) 20 MG tablet, Take 1 tablet by mouth daily at 6 PM. , Disp: , Rfl: 2 .  STIOLTO RESPIMAT 2.5-2.5 MCG/ACT AERS, Inhale 2 puffs into the lungs every morning. , Disp: , Rfl: 10 .  temazepam (RESTORIL) 30 MG capsule, Take 1 capsule by mouth daily., Disp: , Rfl: 0 .  traMADol (ULTRAM) 50 MG tablet, 100 mg every 6 (six) hours as needed. , Disp: , Rfl: 0  Past Medical History: Past Medical History:  Diagnosis Date  . CAD (coronary artery  disease)   . CHF (congestive heart failure) ([place])   . COPD (chronic obstructive pulmonary disease) ([place])   . Hypertension     Tobacco Use: Social History   Tobacco Use  Smoking Status Former Smoker  . Packs/day: 3.50  . Years: 35.00  . Pack years: 122.50  . Types: Cigarettes  . Last attempt to quit: 07/06/1987  . Years since quitting: 30.5  Smokeless Tobacco Former Systems developer  . Types: Chew  . Quit date: 07/05/2002    Labs: Recent Review Flowsheet Data    Labs for ITP Cardiac and Pulmonary Rehab Latest Ref Rng & Units 09/21/2017   PHART 7.350 - 7.450 7.44   PCO2ART 32.0 - 48.0 mmHg 41   HCO3 20.0 - 28.0 mmol/L 27.8   O2SAT % 94.0       Pulmonary Assessment Scores: Pulmonary Assessment Scores    Row Name  11/30/17 1121 01/05/18 1017       ADL UCSD   ADL Phase  Entry  Mid    SOB Score total  51  22    Rest  2  0    Walk  3  1    Stairs  4  2    Bath  0  1    Dress  3  1    Shop  2  1      CAT Score   CAT Score  17  -      mMRC Score   mMRC Score  2  -       Pulmonary Function Assessment: Pulmonary Function Assessment - 11/30/17 1124      Pulmonary Function Tests   FVC%  92 % test done on 09/21/17    FEV1%  70 %    FEV1/FVC Ratio  59      Breath   Bilateral Breath Sounds  Clear    Shortness of Breath  Yes;Limiting activity       Exercise Target Goals:    Exercise Program Goal: Individual exercise prescription set using results from initial 6 min walk test and THRR while considering  patient's activity barriers and safety.    Exercise Prescription Goal: Initial exercise prescription builds to 30-45 minutes a day of aerobic activity, 2-3 days per week.  Home exercise guidelines will be given to patient during program as part of exercise prescription that the participant will acknowledge.  Activity Barriers & Risk Stratification:   6 Minute Walk: 6 Minute Walk    Row Name 11/30/17 1306         6 Minute Walk   Distance  1186 feet     Walk Time  6 minutes     # of Rest Breaks  0     MPH  2.25     METS  2.09     RPE  13     Perceived Dyspnea   2     VO2 Peak  7.33     Symptoms  No     Resting HR  77 bpm     Resting BP  128/66     Resting Oxygen Saturation   92 %     Exercise Oxygen Saturation  during 6 min walk  92 %     Max Ex. HR  107 bpm     Max Ex. BP  178/82     2 Minute Post BP  164/84       Interval HR   2 Minute HR  88  3 Minute HR  93     4 Minute HR  96     5 Minute HR  105     6 Minute HR  107     2 Minute Post HR  85       Interval Oxygen   Baseline Oxygen Saturation %  92 %     1 Minute Liters of Oxygen  0 L     2 Minute Oxygen Saturation %  93 %     2 Minute Liters of Oxygen  0 L     3 Minute Oxygen Saturation %  99 %     3  Minute Liters of Oxygen  0 L     4 Minute Oxygen Saturation %  93 %     4 Minute Liters of Oxygen  0 L     5 Minute Oxygen Saturation %  93 %     6 Minute Oxygen Saturation %  92 %     6 Minute Liters of Oxygen  0 L     2 Minute Post Oxygen Saturation %  95 %     2 Minute Post Liters of Oxygen  0 L       Oxygen Initial Assessment: Oxygen Initial Assessment - 11/30/17 1128      Home Oxygen   Home Oxygen Device  None    Sleep Oxygen Prescription  None    Home Exercise Oxygen Prescription  None    Home at Rest Exercise Oxygen Prescription  None      Initial 6 min Walk   Oxygen Used  None      Program Oxygen Prescription   Program Oxygen Prescription  None      Intervention   Short Term Goals  To learn and understand importance of maintaining oxygen saturations>88%;To learn and demonstrate proper use of respiratory medications;To learn and demonstrate proper pursed lip breathing techniques or other breathing techniques.;To learn and understand importance of monitoring SPO2 with pulse oximeter and demonstrate accurate use of the pulse oximeter.    Long  Term Goals  Verbalizes importance of monitoring SPO2 with pulse oximeter and return demonstration;Exhibits proper breathing techniques, such as pursed lip breathing or other method taught during program session;Demonstrates proper use of MDI's;Compliance with respiratory medication;Maintenance of O2 saturations>88%       Oxygen Re-Evaluation: Oxygen Re-Evaluation    Row Name 12/10/17 1014             Goals/Expected Outcomes   Short Term Goals  To learn and demonstrate proper pursed lip breathing techniques or other breathing techniques.;To learn and understand importance of monitoring SPO2 with pulse oximeter and demonstrate accurate use of the pulse oximeter.;To learn and exhibit compliance with exercise, home and travel O2 prescription;To learn and understand importance of maintaining oxygen saturations>88%       Long  Term  Goals  Maintenance of O2 saturations>88%;Exhibits proper breathing techniques, such as pursed lip breathing or other method taught during program session;Verbalizes importance of monitoring SPO2 with pulse oximeter and return demonstration       Comments  Reviewed PLB technique with pt.  Talked about how it work and it's important to maintaining his exercise saturations.         Goals/Expected Outcomes  Short: Become more profiecient at using PLB.   Long: Become independent at using PLB.          Oxygen Discharge (Final Oxygen Re-Evaluation): Oxygen Re-Evaluation - 12/10/17 1014  Goals/Expected Outcomes   Short Term Goals  To learn and demonstrate proper pursed lip breathing techniques or other breathing techniques.;To learn and understand importance of monitoring SPO2 with pulse oximeter and demonstrate accurate use of the pulse oximeter.;To learn and exhibit compliance with exercise, home and travel O2 prescription;To learn and understand importance of maintaining oxygen saturations>88%    Long  Term Goals  Maintenance of O2 saturations>88%;Exhibits proper breathing techniques, such as pursed lip breathing or other method taught during program session;Verbalizes importance of monitoring SPO2 with pulse oximeter and return demonstration    Comments  Reviewed PLB technique with pt.  Talked about how it work and it's important to maintaining his exercise saturations.      Goals/Expected Outcomes  Short: Become more profiecient at using PLB.   Long: Become independent at using PLB.       Initial Exercise Prescription: Initial Exercise Prescription - 11/30/17 1300      Date of Initial Exercise RX and Referring Provider   Date  11/30/17    Referring Provider  Sabra Heck      Treadmill   MPH  2    Grade  0    Minutes  15    METs  2.53      Recumbant Bike   Level  2    RPM  60    Watts  15    Minutes  15    METs  2.4      NuStep   Level  2    SPM  80    Minutes  15    METs  2.4       T5 Nustep   Level  1    SPM  80    Minutes  15    METs  2.4      Prescription Details   Frequency (times per week)  3    Duration  Progress to 45 minutes of aerobic exercise without signs/symptoms of physical distress      Intensity   THRR 40-80% of Max Heartrate  102-126    Ratings of Perceived Exertion  11-15    Perceived Dyspnea  0-4      Resistance Training   Training Prescription  Yes    Weight  3 lb    Reps  10-15       Perform Capillary Blood Glucose checks as needed.  Exercise Prescription Changes: Exercise Prescription Changes    Row Name 01/05/18 1200 01/19/18 1500           Response to Exercise   Blood Pressure (Admit)  144/78  142/68      Blood Pressure (Exit)  138/76  128/62      Heart Rate (Admit)  72 bpm  74 bpm      Heart Rate (Exercise)  95 bpm  83 bpm      Heart Rate (Exit)  78 bpm  65 bpm      Oxygen Saturation (Admit)  93 %  92 %      Oxygen Saturation (Exercise)  95 %  95 %      Oxygen Saturation (Exit)  94 %  91 %      Rating of Perceived Exertion (Exercise)  12  12      Perceived Dyspnea (Exercise)  1  1      Symptoms  none  none      Duration  Continue with 45 min of aerobic exercise without signs/symptoms of physical distress.  Continue  with 45 min of aerobic exercise without signs/symptoms of physical distress.      Intensity  THRR unchanged  THRR New THR low due to meds?        Progression   Progression  Continue to progress workloads to maintain intensity without signs/symptoms of physical distress.  Continue to progress workloads to maintain intensity without signs/symptoms of physical distress.      Average METs  3  2.9        Resistance Training   Training Prescription  Yes  Yes      Weight  3 lb  4 lb      Reps  10-15  10-15        Interval Training   Interval Training  No  No        Treadmill   MPH  2.5  2.5      Grade  0.5  0.5      Minutes  15  15      METs  3.09  3.09        Recumbant Bike   Level  3  6       RPM  78  68      Watts  32  46      Minutes  15  15      METs  3  3.2        T5 Nustep   Level  -  6      Minutes  -  15      METs  -  2.3         Exercise Comments: Exercise Comments    Row Name 12/10/17 1012 12/22/17 1521 12/27/17 1024       Exercise Comments  First full day of exercise!  Patient was oriented to gym and equipment including functions, settings, policies, and procedures.  Patient's individual exercise prescription and treatment plan were reviewed.  All starting workloads were established based on the results of the 6 minute walk test done at initial orientation visit.  The plan for exercise progression was also introduced and progression will be customized based on patient's performance and goals.   Reviewed home exercise with pt today.  Pt plans to use TM at home for exercise.  Reviewed THR, pulse, RPE, sign and symptoms, NTG use, and when to call 911 or MD.  Also discussed weather considerations and indoor options.  Pt voiced understanding.   First full day of exercise!  Patient was oriented to gym and equipment including functions, settings, policies, and procedures.  Patient's individual exercise prescription and treatment plan were reviewed.  All starting workloads were established based on the results of the 6 minute walk test done at initial orientation visit.  The plan for exercise progression was also introduced and progression will be customized based on patient's performance and goals.        Exercise Goals and Review: Exercise Goals    Row Name 11/30/17 1305             Exercise Goals   Increase Physical Activity  Yes       Intervention  Provide advice, education, support and counseling about physical activity/exercise needs.;Develop an individualized exercise prescription for aerobic and resistive training based on initial evaluation findings, risk stratification, comorbidities and participant's personal goals.       Expected Outcomes  Short Term: Attend  rehab on a regular basis to increase amount of physical activity.;Long Term: Add in home exercise  to make exercise part of routine and to increase amount of physical activity.;Long Term: Exercising regularly at least 3-5 days a week.       Increase Strength and Stamina  Yes       Intervention  Provide advice, education, support and counseling about physical activity/exercise needs.;Develop an individualized exercise prescription for aerobic and resistive training based on initial evaluation findings, risk stratification, comorbidities and participant's personal goals.       Expected Outcomes  Short Term: Increase workloads from initial exercise prescription for resistance, speed, and METs.;Short Term: Perform resistance training exercises routinely during rehab and add in resistance training at home;Long Term: Improve cardiorespiratory fitness, muscular endurance and strength as measured by increased METs and functional capacity (6MWT)       Able to understand and use rate of perceived exertion (RPE) scale  Yes       Intervention  Provide education and explanation on how to use RPE scale       Expected Outcomes  Short Term: Able to use RPE daily in rehab to express subjective intensity level;Long Term:  Able to use RPE to guide intensity level when exercising independently       Able to understand and use Dyspnea scale  Yes       Intervention  Provide education and explanation on how to use Dyspnea scale       Expected Outcomes  Short Term: Able to use Dyspnea scale daily in rehab to express subjective sense of shortness of breath during exertion;Long Term: Able to use Dyspnea scale to guide intensity level when exercising independently       Knowledge and understanding of Target Heart Rate Range (THRR)  Yes       Intervention  Provide education and explanation of THRR including how the numbers were predicted and where they are located for reference       Expected Outcomes  Short Term: Able to state/look  up THRR;Long Term: Able to use THRR to govern intensity when exercising independently;Short Term: Able to use daily as guideline for intensity in rehab       Able to check pulse independently  Yes       Intervention  Provide education and demonstration on how to check pulse in carotid and radial arteries.;Review the importance of being able to check your own pulse for safety during independent exercise       Expected Outcomes  Short Term: Able to explain why pulse checking is important during independent exercise;Long Term: Able to check pulse independently and accurately       Understanding of Exercise Prescription  Yes       Intervention  Provide education, explanation, and written materials on patient's individual exercise prescription       Expected Outcomes  Short Term: Able to explain program exercise prescription;Long Term: Able to explain home exercise prescription to exercise independently          Exercise Goals Re-Evaluation : Exercise Goals Re-Evaluation    Row Name 12/10/17 1013 12/22/17 1522 01/05/18 1249 01/19/18 1507       Exercise Goal Re-Evaluation   Exercise Goals Review  Able to understand and use rate of perceived exertion (RPE) scale;Knowledge and understanding of Target Heart Rate Range (THRR);Understanding of Exercise Prescription;Increase Physical Activity;Able to understand and use Dyspnea scale;Increase Strength and Stamina  Increase Physical Activity;Able to understand and use rate of perceived exertion (RPE) scale;Knowledge and understanding of Target Heart Rate Range (THRR);Increase Strength and Stamina;Able to understand  and use Dyspnea scale  Increase Physical Activity;Able to understand and use rate of perceived exertion (RPE) scale;Increase Strength and Stamina  Increase Physical Activity;Able to understand and use rate of perceived exertion (RPE) scale;Increase Strength and Stamina;Able to understand and use Dyspnea scale    Comments  Reviewed RPE scale, THR and  program prescription with pt today.  Pt voiced understanding and was given a copy of goals to take home.    Reviewed home exercise with pt today.  Pt plans to use TM at home for exercise.  Reviewed THR, pulse, RPE, sign and symptoms, NTG use, and when to call 911 or MD.  Also discussed weather considerations and indoor options.  Pt voiced understanding.  Bud has added speed and incline to TM and increased resistance on RB.  He is porgressing well.  Staff will continue to monitor progress  Bud is progressing well with exercise.  He has increased overall MET level on all equipment.     Expected Outcomes  Short: Use RPE daily to regulate intensity.  Long: Follow program prescription in THR.  Short - Pt will add one day at home in addition to program sessions Long - Pt will exercise independently  Short - Bud will continue to attend regularly Long - Bud will increase overall MET level   Short - Bud will continue to attend regularly.  Long - Bud will maintain fitness gains       Discharge Exercise Prescription (Final Exercise Prescription Changes): Exercise Prescription Changes - 01/19/18 1500      Response to Exercise   Blood Pressure (Admit)  142/68    Blood Pressure (Exit)  128/62    Heart Rate (Admit)  74 bpm    Heart Rate (Exercise)  83 bpm    Heart Rate (Exit)  65 bpm    Oxygen Saturation (Admit)  92 %    Oxygen Saturation (Exercise)  95 %    Oxygen Saturation (Exit)  91 %    Rating of Perceived Exertion (Exercise)  12    Perceived Dyspnea (Exercise)  1    Symptoms  none    Duration  Continue with 45 min of aerobic exercise without signs/symptoms of physical distress.    Intensity  THRR New THR low due to meds?      Progression   Progression  Continue to progress workloads to maintain intensity without signs/symptoms of physical distress.    Average METs  2.9      Resistance Training   Training Prescription  Yes    Weight  4 lb    Reps  10-15      Interval Training   Interval  Training  No      Treadmill   MPH  2.5    Grade  0.5    Minutes  15    METs  3.09      Recumbant Bike   Level  6    RPM  68    Watts  46    Minutes  15    METs  3.2      T5 Nustep   Level  6    Minutes  15    METs  2.3       Nutrition:  Target Goals: Understanding of nutrition guidelines, daily intake of sodium '1500mg'$ , cholesterol '200mg'$ , calories 30% from fat and 7% or less from saturated fats, daily to have 5 or more servings of fruits and vegetables.  Biometrics: Pre Biometrics - 11/30/17 1304  Pre Biometrics   Height  5' 10.25" (1.784 m)    Weight  235 lb 12.8 oz (107 kg)    Waist Circumference  45 inches    Hip Circumference  45.5 inches    Waist to Hip Ratio  0.99 %    BMI (Calculated)  33.61        Nutrition Therapy Plan and Nutrition Goals: Nutrition Therapy & Goals - 12/22/17 1036      Nutrition Therapy   Diet  TLC    Protein (specify units)  9oz    Fiber  37 grams    Saturated Fats  16 max. grams    Fruits and Vegetables  4 servings/day    Sodium  2000 grams      Personal Nutrition Goals   Nutrition Goal  Choose more "wholesome" snacks to help decrease hunger throughout the day, for example add a fruit / protein source    Personal Goal #2  Include sources of fiber and protein at each meal    Personal Goal #3  Increase intake of vegetables each week      Intervention Plan   Intervention  Prescribe, educate and counsel regarding individualized specific dietary modifications aiming towards targeted core components such as weight, hypertension, lipid management, diabetes, heart failure and other comorbidities.    Expected Outcomes  Short Term Goal: Understand basic principles of dietary content, such as calories, fat, sodium, cholesterol and nutrients.;Short Term Goal: A plan has been developed with personal nutrition goals set during dietitian appointment.;Long Term Goal: Adherence to prescribed nutrition plan.       Nutrition  Assessments: Nutrition Assessments - 11/30/17 1120      MEDFICTS Scores   Pre Score  60       Nutrition Goals Re-Evaluation: Nutrition Goals Re-Evaluation    Row Name 12/27/17 1028             Goals   Current Weight  230 lb 1.6 oz (104.4 kg)       Nutrition Goal  Lose weight and eat Healthier.       Comment  He has been eatring more fruit and vegetables. He takes vitamins but no protien of suppliments. He is going to work on eating supper earlier.       Expected Outcome  Short: eat dinner earlier. Long: maintain eating dinner earlier and not so close to bed time.          Nutrition Goals Discharge (Final Nutrition Goals Re-Evaluation): Nutrition Goals Re-Evaluation - 12/27/17 1028      Goals   Current Weight  230 lb 1.6 oz (104.4 kg)    Nutrition Goal  Lose weight and eat Healthier.    Comment  He has been eatring more fruit and vegetables. He takes vitamins but no protien of suppliments. He is going to work on eating supper earlier.    Expected Outcome  Short: eat dinner earlier. Long: maintain eating dinner earlier and not so close to bed time.       Psychosocial: Target Goals: Acknowledge presence or absence of significant depression and/or stress, maximize coping skills, provide positive support system. Participant is able to verbalize types and ability to use techniques and skills needed for reducing stress and depression.   Initial Review & Psychosocial Screening: Initial Psych Review & Screening - 11/30/17 1118      Initial Review   Current issues with  Current Sleep Concerns;Current Stress Concerns    Source of Stress Concerns  Unable to  perform yard/household activities;Chronic Illness    Comments  he has been having trouble sleeping lately and gets short of breath at times.      Family Dynamics   Good Support System?  Yes    Comments  patient has 2 sons and a daughter that live within 10 miles of him.      Barriers   Psychosocial barriers to participate in  program  The patient should benefit from training in stress management and relaxation.      Screening Interventions   Interventions  Encouraged to exercise;Provide feedback about the scores to participant;Program counselor consult;To provide support and resources with identified psychosocial needs    Expected Outcomes  Short Term goal: Utilizing psychosocial counselor, staff and physician to assist with identification of specific Stressors or current issues interfering with healing process. Setting desired goal for each stressor or current issue identified.;Long Term Goal: Stressors or current issues are controlled or eliminated.;Short Term goal: Identification and review with participant of any Quality of Life or Depression concerns found by scoring the questionnaire.;Long Term goal: The participant improves quality of Life and PHQ9 Scores as seen by post scores and/or verbalization of changes       Quality of Life Scores:  Scores of 19 and below usually indicate a poorer quality of life in these areas.  A difference of  2-3 points is a clinically meaningful difference.  A difference of 2-3 points in the total score of the Quality of Life Index has been associated with significant improvement in overall quality of life, self-image, physical symptoms, and general health in studies assessing change in quality of life.  PHQ-9: Recent Review Flowsheet Data    Depression screen [facility] 2/9 01/05/2018 11/30/2017 07/20/2017   Decreased Interest 0 2 1   Down, Depressed, Hopeless 0 0 0   PHQ - 2 Score 0 2 1   Altered sleeping '2 3 3   '$ Tired, decreased energy '2 3 3   '$ Change in appetite 2 0 3   Feeling bad or failure about yourself  0 0 0   Trouble concentrating 0 0 0   Moving slowly or fidgety/restless 0 0 0   Suicidal thoughts 0 0 0   PHQ-9 Score '6 8 10   '$ Difficult doing work/chores Somewhat difficult Somewhat difficult Not difficult at all     Interpretation of Total Score  Total Score Depression  Severity:  1-4 = Minimal depression, 5-9 = Mild depression, 10-14 = Moderate depression, 15-19 = Moderately severe depression, 20-27 = Severe depression   Psychosocial Evaluation and Intervention: Psychosocial Evaluation - 12/15/17 1043      Psychosocial Evaluation & Interventions   Interventions  Encouraged to exercise with the program and follow exercise prescription    Comments  Counselor met with Mr. Baby [facility]) today for initial psychosocial evaluation.  He is an 84 year old who has COPD.  Bud has a strong support system with a spouse of 43 years and (3) adult children who live locally.  He has chronic sleep problems - although he reports sleeping "all night last night for the first time in 5 years!"  He takes an OTC Melatonin to help with this.  Bud states his appetite is "too good" and he has goals to lose weight while in this program.  He reports a history of anxiety and takes medication for this that he says is "helpful."  Bud is typically in a positive mood and has minimal stress in his life other than  his health at this time.  He also has goals just to breathe better in general.  Staff will follow with Bud throughout the course of this program.      Expected Outcomes  Bud will benefit from consistent exercise to achieve his stated goals.  The educational and psychoeducational components of this program will be helpful in understanding and coping with his condition in a more positive way.  Staff will follow with him.     Continue Psychosocial Services   Follow up required by staff       Psychosocial Re-Evaluation: Psychosocial Re-Evaluation    [place] Name 12/27/17 1023             Psychosocial Re-Evaluation   Current issues with  Current Stress Concerns;Current Sleep Concerns       Comments  He has not been sleeping to well lately. He takes melatonin to help him sleep but lately he has been waking up. Sometimes he has to get up to use the bathroom. He is going to try to stop eating  and drinking before 730pm  to help reduce waking up in the middle of the night.       Expected Outcomes  Short: try not to eat or drink past 730 for sleep. Long: Maintain eating  before 730pm.       Interventions  Encouraged to attend Pulmonary Rehabilitation for the exercise          Psychosocial Discharge (Final Psychosocial Re-Evaluation): Psychosocial Re-Evaluation - 12/27/17 1023      Psychosocial Re-Evaluation   Current issues with  Current Stress Concerns;Current Sleep Concerns    Comments  He has not been sleeping to well lately. He takes melatonin to help him sleep but lately he has been waking up. Sometimes he has to get up to use the bathroom. He is going to try to stop eating and drinking before 730pm  to help reduce waking up in the middle of the night.    Expected Outcomes  Short: try not to eat or drink past 730 for sleep. Long: Maintain eating  before 730pm.    Interventions  Encouraged to attend Pulmonary Rehabilitation for the exercise       Education: Education Goals: Education classes will be provided on a weekly basis, covering required topics. Participant will state understanding/return demonstration of topics presented.  Learning Barriers/Preferences: Learning Barriers/Preferences - 11/30/17 1125      Learning Barriers/Preferences   Learning Barriers  Reading;Sight wears glasses    Learning Preferences  Individual Instruction;Verbal Instruction;Video       Education Topics:  Initial Evaluation Education: - Verbal, written and demonstration of respiratory meds, oximetry and breathing techniques. Instruction on use of nebulizers and MDIs and importance of monitoring MDI activations.   Pulmonary Rehab from 01/05/2018 in [facility] Cardiac and Pulmonary Rehab  Date  11/30/17  Educator  [facility]  Instruction Review Code  1- Verbalizes Understanding      General Nutrition Guidelines/Fats and Fiber: -Group instruction provided by verbal, written material, models and  posters to present the general guidelines for heart healthy nutrition. Gives an explanation and review of dietary fats and fiber.   Pulmonary Rehab from 07/20/2017 in [facility] Cardiac and Pulmonary Rehab  Date  07/20/17  Educator  [facility]  Instruction Review Code  1- Verbalizes Understanding      Controlling Sodium/Reading Food Labels: -Group verbal and written material supporting the discussion of sodium use in heart healthy nutrition. Review and explanation with models, verbal and written  materials for utilization of the food label.   Exercise Physiology & General Exercise Guidelines: - Group verbal and written instruction with models to review the exercise physiology of the cardiovascular system and associated critical values. Provides general exercise guidelines with specific guidelines to those with heart or lung disease.    Aerobic Exercise & Resistance Training: - Gives group verbal and written instruction on the various components of exercise. Focuses on aerobic and resistive training programs and the benefits of this training and how to safely progress through these programs.   Flexibility, Balance, Mind/Body Relaxation: Provides group verbal/written instruction on the benefits of flexibility and balance training, including mind/body exercise modes such as yoga, pilates and tai chi.  Demonstration and skill practice provided.   Stress and Anxiety: - Provides group verbal and written instruction about the health risks of elevated stress and causes of high stress.  Discuss the correlation between heart/lung disease and anxiety and treatment options. Review healthy ways to manage with stress and anxiety.   Pulmonary Rehab from 01/05/2018 in [facility] Cardiac and Pulmonary Rehab  Date  12/29/17  Educator  [facility]  Instruction Review Code  1- Verbalizes Understanding      Depression: - Provides group verbal and written instruction on the correlation between heart/lung disease and depressed mood,  treatment options, and the stigmas associated with seeking treatment.   Pulmonary Rehab from 01/05/2018 in [facility] Cardiac and Pulmonary Rehab  Date  12/15/17  Educator  [facility]  Instruction Review Code  1- Verbalizes Understanding      Exercise & Equipment Safety: - Individual verbal instruction and demonstration of equipment use and safety with use of the equipment.   Pulmonary Rehab from 01/05/2018 in [facility] Cardiac and Pulmonary Rehab  Date  11/30/17  Educator  [facility]  Instruction Review Code  1- Verbalizes Understanding      Infection Prevention: - Provides verbal and written material to individual with discussion of infection control including proper hand washing and proper equipment cleaning during exercise session.   Pulmonary Rehab from 01/05/2018 in [facility] Cardiac and Pulmonary Rehab  Date  11/30/17  Educator  [facility]  Instruction Review Code  1- Verbalizes Understanding      Falls Prevention: - Provides verbal and written material to individual with discussion of falls prevention and safety.   Pulmonary Rehab from 01/05/2018 in [facility] Cardiac and Pulmonary Rehab  Date  11/30/17  Educator  [facility]  Instruction Review Code  1- Verbalizes Understanding      Diabetes: - Individual verbal and written instruction to review signs/symptoms of diabetes, desired ranges of glucose level fasting, after meals and with exercise. Advice that pre and post exercise glucose checks will be done for 3 sessions at entry of program.   Chronic Lung Diseases: - Group verbal and written instruction to review updates, respiratory medications, advancements in procedures and treatments. Discuss use of supplemental oxygen including available portable oxygen systems, continuous and intermittent flow rates, concentrators, personal use and safety guidelines. Review proper use of inhaler and spacers. Provide informative websites for self-education.    Energy Conservation: - Provide group verbal and written instruction for  methods to conserve energy, plan and organize activities. Instruct on pacing techniques, use of adaptive equipment and posture/positioning to relieve shortness of breath.   Pulmonary Rehab from 01/05/2018 in [facility] Cardiac and Pulmonary Rehab  Date  12/22/17  Educator  [facility]  Instruction Review Code  1- Verbalizes Understanding      Triggers and Exacerbations: - Group verbal and written  instruction to review types of environmental triggers and ways to prevent exacerbations. Discuss weather changes, air quality and the benefits of nasal washing. Review warning signs and symptoms to help prevent infections. Discuss techniques for effective airway clearance, coughing, and vibrations.   AED/CPR: - Group verbal and written instruction with the use of models to demonstrate the basic use of the AED with the basic ABC's of resuscitation.   Pulmonary Rehab from 01/05/2018 in [facility] Cardiac and Pulmonary Rehab  Date  12/24/17  Educator  [facility]  Instruction Review Code  1- Actuary and Physiology of the Lungs: - Group verbal and written instruction with the use of models to provide basic lung anatomy and physiology related to function, structure and complications of lung disease.   Pulmonary Rehab from 01/05/2018 in [facility] Cardiac and Pulmonary Rehab  Date  01/05/18  Educator  [facility]  Instruction Review Code  1- Verbalizes Understanding      Anatomy & Physiology of the Heart: - Group verbal and written instruction and models provide basic cardiac anatomy and physiology, with the coronary electrical and arterial systems. Review of Valvular disease and Heart Failure   Cardiac Medications: - Group verbal and written instruction to review commonly prescribed medications for heart disease. Reviews the medication, class of the drug, and side effects.   Pulmonary Rehab from 01/05/2018 in [facility] Cardiac and Pulmonary Rehab  Date  12/10/17  Educator  [facility]  Instruction Review Code  1-  Verbalizes Understanding      Know Your Numbers and Risk Factors: -Group verbal and written instruction about important numbers in your health.  Discussion of what are risk factors and how they play a role in the disease process.  Review of Cholesterol, Blood Pressure, Diabetes, and BMI and the role they play in your overall health.   Sleep Hygiene: -Provides group verbal and written instruction about how sleep can affect your health.  Define sleep hygiene, discuss sleep cycles and impact of sleep habits. Review good sleep hygiene tips.    Other: -Provides group and verbal instruction on various topics (see comments)    Knowledge Questionnaire Score: Knowledge Questionnaire Score - 11/30/17 1125      Knowledge Questionnaire Score   Pre Score  16/18 reviewed with patient        Core Components/Risk Factors/Patient Goals at Admission: Personal Goals and Risk Factors at Admission - 11/30/17 1128      Core Components/Risk Factors/Patient Goals on Admission    Weight Management  Yes;Weight Loss    Intervention  Weight Management: Develop a combined nutrition and exercise program designed to reach desired caloric intake, while maintaining appropriate intake of nutrient and fiber, sodium and fats, and appropriate energy expenditure required for the weight goal.;Weight Management: Provide education and appropriate resources to help participant work on and attain dietary goals.;Weight Management/Obesity: Establish reasonable short term and long term weight goals.;Obesity: Provide education and appropriate resources to help participant work on and attain dietary goals.    Admit Weight  235 lb 12.8 oz (107 kg)    Goal Weight: Short Term  230 lb (104.3 kg)    Goal Weight: Long Term  200 lb (90.7 kg)    Expected Outcomes  Short Term: Continue to assess and modify interventions until short term weight is achieved;Weight Maintenance: Understanding of the daily nutrition guidelines, which includes  25-35% calories from fat, 7% or less cal from saturated fats, less than '200mg'$  cholesterol, less than 1.5gm of sodium, &  5 or more servings of fruits and vegetables daily;Long Term: Adherence to nutrition and physical activity/exercise program aimed toward attainment of established weight goal;Weight Loss: Understanding of general recommendations for a balanced deficit meal plan, which promotes 1-2 lb weight loss per week and includes a negative energy balance of [phone] kcal/d;Understanding recommendations for meals to include 15-35% energy as protein, 25-35% energy from fat, 35-60% energy from carbohydrates, less than '200mg'$  of dietary cholesterol, 20-35 gm of total fiber daily;Understanding of distribution of calorie intake throughout the day with the consumption of 4-5 meals/snacks    Improve shortness of breath with ADL's  Yes    Intervention  Provide education, individualized exercise plan and daily activity instruction to help decrease symptoms of SOB with activities of daily living.    Expected Outcomes  Short Term: Improve cardiorespiratory fitness to achieve a reduction of symptoms when performing ADLs;Long Term: Be able to perform more ADLs without symptoms or delay the onset of symptoms    Hypertension  Yes    Intervention  Provide education on lifestyle modifcations including regular physical activity/exercise, weight management, moderate sodium restriction and increased consumption of fresh fruit, vegetables, and low fat dairy, alcohol moderation, and smoking cessation.;Monitor prescription use compliance.    Expected Outcomes  Short Term: Continued assessment and intervention until BP is < 140/31m HG in hypertensive participants. < 130/869mHG in hypertensive participants with diabetes, heart failure or chronic kidney disease.;Long Term: Maintenance of blood pressure at goal levels.    Lipids  Yes    Intervention  Provide education and support for participant on nutrition & aerobic/resistive  exercise along with prescribed medications to achieve LDL '70mg'$ , HDL >'40mg'$ .    Expected Outcomes  Short Term: Participant states understanding of desired cholesterol values and is compliant with medications prescribed. Participant is following exercise prescription and nutrition guidelines.;Long Term: Cholesterol controlled with medications as prescribed, with individualized exercise RX and with personalized nutrition plan. Value goals: LDL < '70mg'$ , HDL > 40 mg.       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 His weight has gone down 5 pounds. He wants to continue with his weight loss. Wally blood pressure is getting better since he started the program.       Expected Outcomes  Short: continue [facility] to lose more weight. 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 His weight has gone down 5 pounds. He wants to continue with his weight loss. [place]ace blood pressure is getting better since he started the program.    Expected Outcomes  Short: continue [facility] to lose more weight. Long: maintain an exercise routine to continue weight loss       ITP Comments: ITP Comments    Row Name 11/30/17 1143 11/30/17 1144  12/06/17 0905 12/29/17 1026 01/03/18 0815   ITP Comments  Medical Evaluation completed. Chart sent for  review and changes to Dr. Emily Filbert Director of [place]. Diagnosis can be found in [facility] encounter 11/15/17  Met face to face with Doctor Emily Filbert director of [place]. Chart Reviewed and signed.  30 day review completed. ITP sent to Dr. Emily Filbert Director of [place]. Continue with ITP unless changes are made by physician.  Patient met with Dr Sabra Heck director of [place] for face to face. Chart reviwed and signed. Continue with exercise prescription unless told otherwise.  30 day review completed. ITP sent to Dr. Emily Filbert Director of [place]. Continue with ITP unless changes are made by physician.   [place] Name 01/31/18 0820 01/31/18 0821         ITP Comments  Patient has not been in class due to a procedure and was not able to meet Dr. Sabra Heck face to face on 01/28/18.   30 day review completed. ITP sent to Dr. Emily Filbert Director of [place]. Continue with ITP unless changes are made by physician         Comments: 30 day review

## 2018-01-31 NOTE — Telephone Encounter (Signed)
Spoke to Jerry Jimenez about him returning to [place]. He was released from the hospital yesterday. Informed her that He will need a note from the doctor that it is ok to exercise. Gave patients Jimenez fax number. Patients Jimenez verbalizes understanding.

## 2018-02-07 DIAGNOSIS — Z87891 Personal history of nicotine dependence: Secondary | ICD-10-CM | POA: Diagnosis not present

## 2018-02-07 DIAGNOSIS — J449 Chronic obstructive pulmonary disease, unspecified: Secondary | ICD-10-CM

## 2018-02-07 NOTE — Progress Notes (Signed)
Daily Session Note  Patient Details  Name: Jerry Jimenez MRN: [id number] Date of Birth: Jul 25, 1934 Referring Provider:     Pulmonary Rehab from 11/30/2017 in [facility] Cardiac and Pulmonary Rehab  Referring Provider  Sabra Heck      Encounter Date: 02/07/2018  Check In: Session Check In - 02/07/18 0942      Check-In   Location  [facility]-Cardiac & Pulmonary Rehab    Staff Present  Nada Maclachlan, BA, ACSM CEP, Exercise Physiologist;Kelly Amedeo Plenty, BS, ACSM CEP, Exercise Physiologist;Ardean Melroy Flavia Shipper    Supervising physician immediately available to respond to emergencies  [facility] immediately available ER MD    Physician(s)  Dr. Burlene Arnt and Joni Fears    Medication changes reported      No    Fall or balance concerns reported     No    Tobacco Cessation  No Change    Warm-up and Cool-down  Performed as group-led instruction    Resistance Training Performed  Yes    VAD Patient?  No      Pain Assessment   Currently in Pain?  No/denies          Social History   Tobacco Use  Smoking Status Former Smoker  . Packs/day: 3.50  . Years: 35.00  . Pack years: 122.50  . Types: Cigarettes  . Last attempt to quit: 07/06/1987  . Years since quitting: 30.6  Smokeless Tobacco Former Systems developer  . Types: Chew  . Quit date: 07/05/2002    Goals Met:  Independence with exercise equipment Exercise tolerated well No report of cardiac concerns or symptoms Strength training completed today  Goals Unmet:  Not Applicable  Comments: Pt able to follow exercise prescription today without complaint.  Will continue to monitor for progression.   Dr. Emily Filbert is Medical Director for [place] and [facility] Pulmonary Rehabilitation.

## 2018-02-09 DIAGNOSIS — J449 Chronic obstructive pulmonary disease, unspecified: Secondary | ICD-10-CM

## 2018-02-09 NOTE — Progress Notes (Signed)
Daily Session Note  Patient Details  Name: Jerry Jimenez MRN: [id number] Date of Birth: [birth year] Referring Provider:     Pulmonary Rehab from 11/30/2017 in [facility] Cardiac and Pulmonary Rehab  Referring Provider  Sabra Heck      Encounter Date: 02/09/2018  Check In: Session Check In - 02/09/18 0958      Check-In   Location  [facility]-Cardiac & Pulmonary Rehab    Staff Present  Justin Mend RCP,RRT,BSRT;Amanda Oletta Darter, BA, ACSM CEP, Exercise Physiologist;Jessica Luan Pulling, MA, RCEP, CCRP, Exercise Physiologist    Supervising physician immediately available to respond to emergencies  [facility] immediately available ER MD    Physician(s)  Dr. Owens Shark and Jimmye Norman    Medication changes reported      No    Fall or balance concerns reported     No    Tobacco Cessation  No Change    Warm-up and Cool-down  Performed as group-led instruction    Resistance Training Performed  Yes    VAD Patient?  No      Pain Assessment   Currently in Pain?  No/denies          Social History   Tobacco Use  Smoking Status Former Smoker  . Packs/day: 3.50  . Years: 35.00  . Pack years: 122.50  . Types: Cigarettes  . Last attempt to quit: 07/06/1987  . Years since quitting: 30.6  Smokeless Tobacco Former Systems developer  . Types: Chew  . Quit date: 07/05/2002    Goals Met:  Independence with exercise equipment Exercise tolerated well No report of cardiac concerns or symptoms Strength training completed today  Goals Unmet:  Not Applicable  Comments: Pt able to follow exercise prescription today without complaint.  Will continue to monitor for progression.   Dr. Emily Filbert is Medical Director for [place] and [facility] Pulmonary Rehabilitation.

## 2018-02-11 ENCOUNTER — Encounter: Payer: Medicare Other | Admitting: *Deleted

## 2018-02-11 DIAGNOSIS — J449 Chronic obstructive pulmonary disease, unspecified: Secondary | ICD-10-CM | POA: Diagnosis not present

## 2018-02-11 NOTE — Progress Notes (Signed)
Daily Session Note  Patient Details  Name: Jerry Jimenez MRN: [id number] Date of Birth: May 04, 1934 Referring Provider:     Pulmonary Rehab from 11/30/2017 in [facility] Cardiac and Pulmonary Rehab  Referring Provider  Sabra Heck      Encounter Date: 02/11/2018  Check In: Session Check In - 02/11/18 1003      Check-In   Location  [facility]-Cardiac & Pulmonary Rehab    Staff Present  Renita Papa, RN BSN;Abiola Behring Luan Pulling, MA, RCEP, CCRP, Exercise Physiologist;Mandi Zachery Conch, BS, [place] physician immediately available to respond to emergencies  [facility] immediately available ER MD    Physician(s)  Drs. Owens Shark and Lord    Medication changes reported      No    Fall or balance concerns reported     No    Resistance Training Performed  Yes    VAD Patient?  No      Pain Assessment   Currently in Pain?  No/denies          Social History   Tobacco Use  Smoking Status Former Smoker  . Packs/day: 3.50  . Years: 35.00  . Pack years: 122.50  . Types: Cigarettes  . Last attempt to quit: 07/06/1987  . Years since quitting: 30.6  Smokeless Tobacco Former Systems developer  . Types: Chew  . Quit date: 07/05/2002    Goals Met:  Proper associated with RPD/PD & O2 Sat Independence with exercise equipment Using PLB without cueing & demonstrates good technique Exercise tolerated well No report of cardiac concerns or symptoms Strength training completed today  Goals Unmet:  Not Applicable  Comments: Pt able to follow exercise prescription today without complaint.  Will continue to monitor for progression.    Dr. Emily Filbert is Medical Director for [place] and [facility] Pulmonary Rehabilitation.

## 2018-02-14 DIAGNOSIS — J449 Chronic obstructive pulmonary disease, unspecified: Secondary | ICD-10-CM

## 2018-02-14 NOTE — Progress Notes (Signed)
Daily Session Note  Patient Details  Name: Jerry Jimenez MRN: [id number] Date of Birth: 05-27-1934 Referring Provider:     Pulmonary Rehab from 11/30/2017 in [facility] Cardiac and Pulmonary Rehab  Referring Provider  Sabra Heck      Encounter Date: 02/14/2018  Check In: Session Check In - 02/14/18 1008      Check-In   Location  [facility]-Cardiac & Pulmonary Rehab    Staff Present  Earlean Shawl, BS, ACSM CEP, Exercise Physiologist;Mandi [employer], BS, PEC;Jannelle Notaro [employer] physician immediately available to respond to emergencies  [facility] immediately available ER MD    Physician(s)  Dr. Reita Cliche and Cinda Quest    Medication changes reported      No    Fall or balance concerns reported     No    Tobacco Cessation  No Change    Warm-up and Cool-down  Performed as group-led instruction    Resistance Training Performed  Yes    VAD Patient?  No      Pain Assessment   Currently in Pain?  No/denies          Social History   Tobacco Use  Smoking Status Former Smoker  . Packs/day: 3.50  . Years: 35.00  . Pack years: 122.50  . Types: Cigarettes  . Last attempt to quit: 07/06/1987  . Years since quitting: 30.6  Smokeless Tobacco Former Systems developer  . Types: Chew  . Quit date: 07/05/2002    Goals Met:  Independence with exercise equipment Exercise tolerated well Personal goals reviewed No report of cardiac concerns or symptoms Strength training completed today  Goals Unmet:  Not Applicable  Comments: Pt able to follow exercise prescription today without complaint.  Will continue to monitor for progression.   Dr. Emily Filbert is Medical Director for [place] and [facility].

## 2018-02-16 DIAGNOSIS — J449 Chronic obstructive pulmonary disease, unspecified: Secondary | ICD-10-CM

## 2018-02-16 NOTE — Progress Notes (Signed)
Daily Session Note  Patient Details  Name: Jerry Jimenez MRN: [id number] Date of Birth: 1934/04/10 Referring Provider:     Pulmonary Rehab from 11/30/2017 in [facility] Cardiac and Pulmonary Rehab  Referring Provider  Sabra Heck      Encounter Date: 02/16/2018  Check In: Session Check In - 02/16/18 1014      Check-In   Location  [facility]-Cardiac & Pulmonary Rehab    Staff Present  Justin Mend RCP,RRT,BSRT;Amanda Oletta Darter, BA, ACSM CEP, Exercise Physiologist;Jessica Luan Pulling, [place], RCEP, CCRP, Exercise Physiologist    Supervising physician immediately available to respond to emergencies  [facility] immediately available ER MD    Physician(s)  Dr. Archie Balboa and Joni Fears    Medication changes reported      No    Fall or balance concerns reported     No    Tobacco Cessation  No Change    Warm-up and Cool-down  Performed as group-led instruction    Resistance Training Performed  Yes    VAD Patient?  No      Pain Assessment   Currently in Pain?  No/denies          Social History   Tobacco Use  Smoking Status Former Smoker  . Packs/day: 3.50  . Years: 35.00  . Pack years: 122.50  . Types: Cigarettes  . Last attempt to quit: 07/06/1987  . Years since quitting: 30.6  Smokeless Tobacco Former Systems developer  . Types: Chew  . Quit date: 07/05/2002    Goals Met:  Independence with exercise equipment Exercise tolerated well No report of cardiac concerns or symptoms Strength training completed today  Goals Unmet:  Not Applicable  Comments: Pt able to follow exercise prescription today without complaint.  Will continue to monitor for progression.   Dr. Emily Filbert is Medical Director for [place] and [facility].

## 2018-02-18 DIAGNOSIS — J449 Chronic obstructive pulmonary disease, unspecified: Secondary | ICD-10-CM

## 2018-02-18 NOTE — Progress Notes (Signed)
Daily Session Note  Patient Details  Name: Jerry Jimenez MRN: [id number] Date of Birth: 1934-09-03 Referring Provider:     Pulmonary Rehab from 11/30/2017 in [facility] Cardiac and Pulmonary Rehab  Referring Provider  Sabra Heck      Encounter Date: 02/18/2018  Check In: Session Check In - 02/18/18 1009      Check-In   Location  [facility]-Cardiac & Pulmonary Rehab    Staff Present  Renita Papa, RN Vickki Hearing, BA, ACSM CEP, Exercise Physiologist;Saige Busby Flavia Shipper    Supervising physician immediately available to respond to emergencies  [facility] immediately available ER MD    Physician(s)  Dr. Jimmye Norman and Corky Downs    Medication changes reported      No    Fall or balance concerns reported     No    Tobacco Cessation  No Change    Warm-up and Cool-down  Performed as group-led instruction    Resistance Training Performed  Yes    VAD Patient?  No      Pain Assessment   Currently in Pain?  No/denies          Social History   Tobacco Use  Smoking Status Former Smoker  . Packs/day: 3.50  . Years: 35.00  . Pack years: 122.50  . Types: Cigarettes  . Last attempt to quit: 07/06/1987  . Years since quitting: 30.6  Smokeless Tobacco Former Systems developer  . Types: Chew  . Quit date: 07/05/2002    Goals Met:  Proper associated with RPD/PD & O2 Sat Independence with exercise equipment Using PLB without cueing & demonstrates good technique Exercise tolerated well No report of cardiac concerns or symptoms Strength training completed today  Goals Unmet:  Not Applicable  Comments: Pt able to follow exercise prescription today without complaint.  Will continue to monitor for progression.    Dr. Emily Filbert is Medical Director for [place] and [facility].

## 2018-02-21 DIAGNOSIS — J449 Chronic obstructive pulmonary disease, unspecified: Secondary | ICD-10-CM

## 2018-02-21 NOTE — Progress Notes (Signed)
Daily Session Note  Patient Details  Name: Jerry Jimenez MRN: [id number] Date of Birth: 02-17-1934 Referring Provider:     Pulmonary Rehab from 11/30/2017 in [facility] Cardiac and Pulmonary Rehab  Referring Provider  Sabra Heck      Encounter Date: 02/21/2018  Check In: Session Check In - 02/21/18 0941      Check-In   Location  [facility]-Cardiac & Pulmonary Rehab    Staff Present  Nada Maclachlan, BA, ACSM CEP, Exercise Physiologist;Haydon Dorris Tedd Sias, [place], ACSM CEP, Exercise Physiologist    Supervising physician immediately available to respond to emergencies  [facility] immediately available ER MD    Physician(s)  Dr. Alfred Levins and Corky Downs    Medication changes reported      No    Fall or balance concerns reported     No    Tobacco Cessation  No Change    Warm-up and Cool-down  Performed as group-led instruction    Resistance Training Performed  Yes    VAD Patient?  No      Pain Assessment   Currently in Pain?  No/denies          Social History   Tobacco Use  Smoking Status Former Smoker  . Packs/day: 3.50  . Years: 35.00  . Pack years: 122.50  . Types: Cigarettes  . Last attempt to quit: 07/06/1987  . Years since quitting: 30.6  Smokeless Tobacco Former Systems developer  . Types: Chew  . Quit date: 07/05/2002    Goals Met:  Independence with exercise equipment Exercise tolerated well No report of cardiac concerns or symptoms Strength training completed today  Goals Unmet:  Not Applicable  Comments: Pt able to follow exercise prescription today without complaint.  Will continue to monitor for progression.   Dr. Emily Filbert is Medical Director for [place] and [facility].

## 2018-02-23 ENCOUNTER — Encounter: Payer: Medicare Other | Attending: Pulmonary Disease

## 2018-02-23 DIAGNOSIS — J449 Chronic obstructive pulmonary disease, unspecified: Secondary | ICD-10-CM

## 2018-02-23 DIAGNOSIS — Z87891 Personal history of nicotine dependence: Secondary | ICD-10-CM | POA: Diagnosis not present

## 2018-02-23 NOTE — Progress Notes (Signed)
Daily Session Note  Patient Details  Name: Jerry Jimenez MRN: [id number] Date of Birth: 10-05-1934 Referring Provider:     Pulmonary Rehab from 11/30/2017 in [facility] Cardiac and Pulmonary Rehab  Referring Provider  Sabra Heck      Encounter Date: 02/23/2018  Check In: Session Check In - 02/23/18 0954      Check-In   Location  [facility]-Cardiac & Pulmonary Rehab    Staff Present  Nada Maclachlan, BA, ACSM CEP, Exercise Physiologist;Jessica Luan Pulling, MA, RCEP, CCRP, Exercise Physiologist;Harris Kistler Flavia Shipper    Supervising physician immediately available to respond to emergencies  [facility] immediately available ER MD    Physician(s)  Dr. Alfred Levins and Burlene Arnt    Medication changes reported      No    Fall or balance concerns reported     No    Tobacco Cessation  No Change    Warm-up and Cool-down  Performed as group-led instruction    Resistance Training Performed  Yes    VAD Patient?  No      Pain Assessment   Currently in Pain?  No/denies          Social History   Tobacco Use  Smoking Status Former Smoker  . Packs/day: 3.50  . Years: 35.00  . Pack years: 122.50  . Types: Cigarettes  . Last attempt to quit: 07/06/1987  . Years since quitting: 30.6  Smokeless Tobacco Former Systems developer  . Types: Chew  . Quit date: 07/05/2002    Goals Met:  Independence with exercise equipment Exercise tolerated well No report of cardiac concerns or symptoms Strength training completed today  Goals Unmet:  Not Applicable  Comments: Pt able to follow exercise prescription today without complaint.  Will continue to monitor for progression.   Dr. Emily Filbert is Medical Director for [place] and [facility].

## 2018-02-25 DIAGNOSIS — J449 Chronic obstructive pulmonary disease, unspecified: Secondary | ICD-10-CM

## 2018-02-25 NOTE — Progress Notes (Signed)
Daily Session Note  Patient Details  Name: JAIVION KINGSLEY MRN: [id number] Date of Birth: [birth year] Referring Provider:     Pulmonary Rehab from 11/30/2017 in [facility] Cardiac and Pulmonary Rehab  Referring Provider  Sabra Heck      Encounter Date: 02/25/2018  Check In: Session Check In - 02/25/18 1011      Check-In   Location  [facility]-Cardiac & Pulmonary Rehab    Staff Present  Nada Maclachlan, BA, ACSM CEP, Exercise Physiologist;Meredith Sherryll Burger, RN BSN;Joseph Flavia Shipper    Supervising physician immediately available to respond to emergencies  [facility] immediately available ER MD    Physician(s)  Dr. Alfred Levins and [facility]    Medication changes reported      No    Fall or balance concerns reported     No    Tobacco Cessation  No Change    Warm-up and Cool-down  Performed as group-led instruction    Resistance Training Performed  Yes    VAD Patient?  No      Pain Assessment   Currently in Pain?  No/denies          Social History   Tobacco Use  Smoking Status Former Smoker  . Packs/day: 3.50  . Years: 35.00  . Pack years: 122.50  . Types: Cigarettes  . Last attempt to quit: 07/06/1987  . Years since quitting: 30.6  Smokeless Tobacco Former Systems developer  . Types: Chew  . Quit date: 07/05/2002    Goals Met:  Independence with exercise equipment Exercise tolerated well No report of cardiac concerns or symptoms Strength training completed today  Goals Unmet:  Not Applicable  Comments: Pt able to follow exercise prescription today without complaint.  Will continue to monitor for progression.   Dr. Emily Filbert is Medical Director for [place] and [facility].

## 2018-02-28 DIAGNOSIS — J449 Chronic obstructive pulmonary disease, unspecified: Secondary | ICD-10-CM

## 2018-02-28 NOTE — Progress Notes (Signed)
Pulmonary Individual Treatment Plan  Patient Details  Name: Jerry Jimenez MRN: [id number] Date of Birth: [birth year] Referring Provider:     Pulmonary Rehab from 11/30/2017 in [facility] Cardiac and Pulmonary Rehab  Referring Provider  Sabra Heck      Initial Encounter Date:    Pulmonary Rehab from 11/30/2017 in [facility] Cardiac and Pulmonary Rehab  Date  11/30/17  Referring Provider  Sabra Heck      Visit Diagnosis: Chronic obstructive pulmonary disease, unspecified COPD type ([place])  Patient's Home Medications on Admission:  Current Outpatient Medications:  .  aspirin EC 81 MG tablet, Take 81 mg by mouth daily., Disp: , Rfl:  .  cephALEXin (KEFLEX) 500 MG capsule, Take 1 capsule (500 mg total) by mouth every 12 (twelve) hours. (Patient not taking: Reported on 08/28/2017), Disp: 10 capsule, Rfl: 0 .  cholecalciferol (VITAMIN D) 1000 units tablet, Take 1,000 Units by mouth daily., Disp: , Rfl:  .  cyanocobalamin 500 MCG tablet, Take 500 mcg by mouth daily., Disp: , Rfl:  .  doxycycline (VIBRAMYCIN) 100 MG capsule, Take 100 mg by mouth 2 (two) times daily., Disp: , Rfl:  .  ELIQUIS 5 MG TABS tablet, Take 1 tablet (5 mg total) by mouth 2 (two) times daily. Patient advised to discuss with primary care physician before starting Eliquis.  It has been on hold for 4 weeks, Disp: 60 tablet, Rfl: 2 .  furosemide (LASIX) 40 MG tablet, Take 40 mg by mouth daily., Disp: , Rfl:  .  gabapentin (NEURONTIN) 100 MG capsule, Take 100 mg by mouth at bedtime., Disp: , Rfl:  .  glimepiride (AMARYL) 2 MG tablet, Take 1 tablet by mouth daily., Disp: , Rfl: 10 .  ipratropium-albuterol (DUONEB) 0.5-2.5 (3) MG/3ML SOLN, Take 3 mLs by nebulization every 6 (six) hours as needed., Disp: 360 mL, Rfl: 1 .  LORazepam (ATIVAN) 0.5 MG tablet, Take 1 tablet (0.5 mg total) every 8 (eight) hours as needed by mouth for anxiety., Disp: 15 tablet, Rfl: 0 .  magnesium oxide (MAG-OX) 400 MG tablet, Take 400 mg by mouth daily., Disp: , Rfl:  .   Melatonin 5 MG TABS, Take 1 tablet by mouth at bedtime., Disp: , Rfl:  .  montelukast (SINGULAIR) 10 MG tablet, Take 1 tablet by mouth every evening. , Disp: , Rfl: 2 .  multivitamin-lutein (OCUVITE-LUTEIN) CAPS capsule, Take 1 capsule by mouth daily., Disp: , Rfl:  .  omeprazole (PRILOSEC) 20 MG capsule, Take 1 capsule by mouth daily., Disp: , Rfl: 0 .  pramipexole (MIRAPEX) 0.5 MG tablet, Take 1 tablet by mouth 2 (two) times daily., Disp: , Rfl: 0 .  predniSONE (DELTASONE) 10 MG tablet, Take 50 mg daily.  Taper by 10 mg daily then stop. (Patient not taking: Reported on 08/28/2017), Disp: 15 tablet, Rfl: 0 .  PROAIR HFA 108 (90 Base) MCG/ACT inhaler, Inhale 2 puffs into the lungs every 6 (six) hours as needed., Disp: , Rfl: 11 .  simvastatin (ZOCOR) 20 MG tablet, Take 1 tablet by mouth daily at 6 PM. , Disp: , Rfl: 2 .  STIOLTO RESPIMAT 2.5-2.5 MCG/ACT AERS, Inhale 2 puffs into the lungs every morning. , Disp: , Rfl: 10 .  temazepam (RESTORIL) 30 MG capsule, Take 1 capsule by mouth daily., Disp: , Rfl: 0 .  traMADol (ULTRAM) 50 MG tablet, 100 mg every 6 (six) hours as needed. , Disp: , Rfl: 0  Past Medical History: Past Medical History:  Diagnosis Date  . CAD (coronary artery  disease)   . CHF (congestive heart failure) ([place])   . COPD (chronic obstructive pulmonary disease) ([place])   . Hypertension     Tobacco Use: Social History   Tobacco Use  Smoking Status Former Smoker  . Packs/day: 3.50  . Years: 35.00  . Pack years: 122.50  . Types: Cigarettes  . Last attempt to quit: 07/06/1987  . Years since quitting: 30.6  Smokeless Tobacco Former Systems developer  . Types: Chew  . Quit date: 07/05/2002    Labs: Recent Review Flowsheet Data    Labs for ITP Cardiac and Pulmonary Rehab Latest Ref Rng & Units 09/21/2017   PHART 7.350 - 7.450 7.44   PCO2ART 32.0 - 48.0 mmHg 41   HCO3 20.0 - 28.0 mmol/L 27.8   O2SAT % 94.0       Pulmonary Assessment Scores: Pulmonary Assessment Scores    Row Name  11/30/17 1121 01/05/18 1017       ADL UCSD   ADL Phase  Entry  Mid    SOB Score total  51  22    Rest  2  0    Walk  3  1    Stairs  4  2    Bath  0  1    Dress  3  1    Shop  2  1      CAT Score   CAT Score  17  -      mMRC Score   mMRC Score  2  -       Pulmonary Function Assessment: Pulmonary Function Assessment - 11/30/17 1124      Pulmonary Function Tests   FVC%  92 % test done on 09/21/17    FEV1%  70 %    FEV1/FVC Ratio  59      Breath   Bilateral Breath Sounds  Clear    Shortness of Breath  Yes;Limiting activity       Exercise Target Goals:    Exercise Program Goal: Individual exercise prescription set using results from initial 6 min walk test and THRR while considering  patient's activity barriers and safety.    Exercise Prescription Goal: Initial exercise prescription builds to 30-45 minutes a day of aerobic activity, 2-3 days per week.  Home exercise guidelines will be given to patient during program as part of exercise prescription that the participant will acknowledge.  Activity Barriers & Risk Stratification:   6 Minute Walk: 6 Minute Walk    Row Name 11/30/17 1306         6 Minute Walk   Distance  1186 feet     Walk Time  6 minutes     # of Rest Breaks  0     MPH  2.25     METS  2.09     RPE  13     Perceived Dyspnea   2     VO2 Peak  7.33     Symptoms  No     Resting HR  77 bpm     Resting BP  128/66     Resting Oxygen Saturation   92 %     Exercise Oxygen Saturation  during 6 min walk  92 %     Max Ex. HR  107 bpm     Max Ex. BP  178/82     2 Minute Post BP  164/84       Interval HR   2 Minute HR  88  3 Minute HR  93     4 Minute HR  96     5 Minute HR  105     6 Minute HR  107     2 Minute Post HR  85       Interval Oxygen   Baseline Oxygen Saturation %  92 %     1 Minute Liters of Oxygen  0 L     2 Minute Oxygen Saturation %  93 %     2 Minute Liters of Oxygen  0 L     3 Minute Oxygen Saturation %  99 %     3  Minute Liters of Oxygen  0 L     4 Minute Oxygen Saturation %  93 %     4 Minute Liters of Oxygen  0 L     5 Minute Oxygen Saturation %  93 %     6 Minute Oxygen Saturation %  92 %     6 Minute Liters of Oxygen  0 L     2 Minute Post Oxygen Saturation %  95 %     2 Minute Post Liters of Oxygen  0 L       Oxygen Initial Assessment: Oxygen Initial Assessment - 11/30/17 1128      Home Oxygen   Home Oxygen Device  None    Sleep Oxygen Prescription  None    Home Exercise Oxygen Prescription  None    Home at Rest Exercise Oxygen Prescription  None      Initial 6 min Walk   Oxygen Used  None      Program Oxygen Prescription   Program Oxygen Prescription  None      Intervention   Short Term Goals  To learn and understand importance of maintaining oxygen saturations>88%;To learn and demonstrate proper use of respiratory medications;To learn and demonstrate proper pursed lip breathing techniques or other breathing techniques.;To learn and understand importance of monitoring SPO2 with pulse oximeter and demonstrate accurate use of the pulse oximeter.    Long  Term Goals  Verbalizes importance of monitoring SPO2 with pulse oximeter and return demonstration;Exhibits proper breathing techniques, such as pursed lip breathing or other method taught during program session;Demonstrates proper use of MDI's;Compliance with respiratory medication;Maintenance of O2 saturations>88%       Oxygen Re-Evaluation: Oxygen Re-Evaluation    Row Name 12/10/17 1014 02/14/18 1023           Program Oxygen Prescription   Program Oxygen Prescription  -  None        Home Oxygen   Home Oxygen Device  -  None      Sleep Oxygen Prescription  -  None      Home Exercise Oxygen Prescription  -  None      Home at Rest Exercise Oxygen Prescription  -  None        Goals/Expected Outcomes   Short Term Goals  To learn and demonstrate proper pursed lip breathing techniques or other breathing techniques.;To learn and  understand importance of monitoring SPO2 with pulse oximeter and demonstrate accurate use of the pulse oximeter.;To learn and exhibit compliance with exercise, home and travel O2 prescription;To learn and understand importance of maintaining oxygen saturations>88%  To learn and demonstrate proper pursed lip breathing techniques or other breathing techniques.;To learn and understand importance of monitoring SPO2 with pulse oximeter and demonstrate accurate use of the pulse oximeter.;To learn and exhibit compliance with exercise, home  and travel O2 prescription;To learn and understand importance of maintaining oxygen saturations>88%      Long  Term Goals  Maintenance of O2 saturations>88%;Exhibits proper breathing techniques, such as pursed lip breathing or other method taught during program session;Verbalizes importance of monitoring SPO2 with pulse oximeter and return demonstration  Maintenance of O2 saturations>88%;Exhibits proper breathing techniques, such as pursed lip breathing or other method taught during program session;Verbalizes importance of monitoring SPO2 with pulse oximeter and return demonstration      Comments  Reviewed PLB technique with pt.  Talked about how it work and it's important to maintaining his exercise saturations.    Jerry Jimenez states that his breathing has not been too good. He states he is not up to par since his surgery. He is still getting a little short of breath when doing daily activities.       Goals/Expected Outcomes  Short: Become more profiecient at using PLB.   Long: Become independent at using PLB.  Short: continue exercising to improve SOB. Long: maintain exercise independently to keep SOB at a minimum.         Oxygen Discharge (Final Oxygen Re-Evaluation): Oxygen Re-Evaluation - 02/14/18 1023      Program Oxygen Prescription   Program Oxygen Prescription  None      Home Oxygen   Home Oxygen Device  None    Sleep Oxygen Prescription  None    Home Exercise Oxygen  Prescription  None    Home at Rest Exercise Oxygen Prescription  None      Goals/Expected Outcomes   Short Term Goals  To learn and demonstrate proper pursed lip breathing techniques or other breathing techniques.;To learn and understand importance of monitoring SPO2 with pulse oximeter and demonstrate accurate use of the pulse oximeter.;To learn and exhibit compliance with exercise, home and travel O2 prescription;To learn and understand importance of maintaining oxygen saturations>88%    Long  Term Goals  Maintenance of O2 saturations>88%;Exhibits proper breathing techniques, such as pursed lip breathing or other method taught during program session;Verbalizes importance of monitoring SPO2 with pulse oximeter and return demonstration    Comments  Jerry Jimenez states that his breathing has not been too good. He states he is not up to par since his surgery. He is still getting a little short of breath when doing daily activities.     Goals/Expected Outcomes  Short: continue exercising to improve SOB. Long: maintain exercise independently to keep SOB at a minimum.       Initial Exercise Prescription: Initial Exercise Prescription - 11/30/17 1300      Date of Initial Exercise RX and Referring Provider   Date  11/30/17    Referring Provider  Sabra Heck      Treadmill   MPH  2    Grade  0    Minutes  15    METs  2.53      Recumbant Bike   Level  2    RPM  60    Watts  15    Minutes  15    METs  2.4      NuStep   Level  2    SPM  80    Minutes  15    METs  2.4      T5 Nustep   Level  1    SPM  80    Minutes  15    METs  2.4      Prescription Details   Frequency (times per week)  3    Duration  Progress to 45 minutes of aerobic exercise without signs/symptoms of physical distress      Intensity   THRR 40-80% of Max Heartrate  102-126    Ratings of Perceived Exertion  11-15    Perceived Dyspnea  0-4      Resistance Training   Training Prescription  Yes    Weight  3 lb    Reps   10-15       Perform Capillary Blood Glucose checks as needed.  Exercise Prescription Changes: Exercise Prescription Changes    Row Name 01/05/18 1200 01/19/18 1500 02/17/18 1000         Response to Exercise   Blood Pressure (Admit)  144/78  142/68  146/80     Blood Pressure (Exit)  138/76  128/62  134/60     Heart Rate (Admit)  72 bpm  74 bpm  63 bpm     Heart Rate (Exercise)  95 bpm  83 bpm  103 bpm     Heart Rate (Exit)  78 bpm  65 bpm  82 bpm     Oxygen Saturation (Admit)  93 %  92 %  97 %     Oxygen Saturation (Exercise)  95 %  95 %  91 %     Oxygen Saturation (Exit)  94 %  91 %  93 %     Rating of Perceived Exertion (Exercise)  '12  12  12     '$ Perceived Dyspnea (Exercise)  '1  1  2     '$ Symptoms  none  none  none     Duration  Continue with 45 min of aerobic exercise without signs/symptoms of physical distress.  Continue with 45 min of aerobic exercise without signs/symptoms of physical distress.  Continue with 45 min of aerobic exercise without signs/symptoms of physical distress.     Intensity  THRR unchanged  THRR New THR low due to meds?  THRR unchanged       Progression   Progression  Continue to progress workloads to maintain intensity without signs/symptoms of physical distress.  Continue to progress workloads to maintain intensity without signs/symptoms of physical distress.  Continue to progress workloads to maintain intensity without signs/symptoms of physical distress.     Average METs  3  2.9  -       Resistance Training   Training Prescription  Yes  Yes  -     Weight  3 lb  4 lb  -     Reps  10-15  10-15  -       Interval Training   Interval Training  No  No  -       Treadmill   MPH  2.5  2.5  2     Grade  0.5  0.5  0.5     Minutes  '15  15  15     '$ METs  3.09  3.09  3.15       Recumbant Bike   Level  '3  6  6     '$ RPM  78  68  63     Watts  32  46  44     Minutes  '15  15  15     '$ METs  3  3.2  3.4       T5 Nustep   Level  -  6  -     Minutes  -  15  -  METs  -  2.3  -        Exercise Comments: Exercise Comments    Row Name 12/10/17 1012 12/22/17 1521 12/27/17 1024       Exercise Comments  First full day of exercise!  Patient was oriented to gym and equipment including functions, settings, policies, and procedures.  Patient's individual exercise prescription and treatment plan were reviewed.  All starting workloads were established based on the results of the 6 minute walk test done at initial orientation visit.  The plan for exercise progression was also introduced and progression will be customized based on patient's performance and goals.   Reviewed home exercise with pt today.  Pt plans to use TM at home for exercise.  Reviewed THR, pulse, RPE, sign and symptoms, NTG use, and when to call 911 or MD.  Also discussed weather considerations and indoor options.  Pt voiced understanding.   First full day of exercise!  Patient was oriented to gym and equipment including functions, settings, policies, and procedures.  Patient's individual exercise prescription and treatment plan were reviewed.  All starting workloads were established based on the results of the 6 minute walk test done at initial orientation visit.  The plan for exercise progression was also introduced and progression will be customized based on patient's performance and goals.        Exercise Goals and Review: Exercise Goals    Row Name 11/30/17 1305             Exercise Goals   Increase Physical Activity  Yes       Intervention  Provide advice, education, support and counseling about physical activity/exercise needs.;Develop an individualized exercise prescription for aerobic and resistive training based on initial evaluation findings, risk stratification, comorbidities and participant's personal goals.       Expected Outcomes  Short Term: Attend rehab on a regular basis to increase amount of physical activity.;Long Term: Add in home exercise to make exercise part of  routine and to increase amount of physical activity.;Long Term: Exercising regularly at least 3-5 days a week.       Increase Strength and Stamina  Yes       Intervention  Provide advice, education, support and counseling about physical activity/exercise needs.;Develop an individualized exercise prescription for aerobic and resistive training based on initial evaluation findings, risk stratification, comorbidities and participant's personal goals.       Expected Outcomes  Short Term: Increase workloads from initial exercise prescription for resistance, speed, and METs.;Short Term: Perform resistance training exercises routinely during rehab and add in resistance training at home;Long Term: Improve cardiorespiratory fitness, muscular endurance and strength as measured by increased METs and functional capacity (6MWT)       Able to understand and use rate of perceived exertion (RPE) scale  Yes       Intervention  Provide education and explanation on how to use RPE scale       Expected Outcomes  Short Term: Able to use RPE daily in rehab to express subjective intensity level;Long Term:  Able to use RPE to guide intensity level when exercising independently       Able to understand and use Dyspnea scale  Yes       Intervention  Provide education and explanation on how to use Dyspnea scale       Expected Outcomes  Short Term: Able to use Dyspnea scale daily in rehab to express subjective sense of shortness of breath during exertion;Long Term: Able  to use Dyspnea scale to guide intensity level when exercising independently       Knowledge and understanding of Target Heart Rate Range (THRR)  Yes       Intervention  Provide education and explanation of THRR including how the numbers were predicted and where they are located for reference       Expected Outcomes  Short Term: Able to state/look up THRR;Long Term: Able to use THRR to govern intensity when exercising independently;Short Term: Able to use daily as  guideline for intensity in rehab       Able to check pulse independently  Yes       Intervention  Provide education and demonstration on how to check pulse in carotid and radial arteries.;Review the importance of being able to check your own pulse for safety during independent exercise       Expected Outcomes  Short Term: Able to explain why pulse checking is important during independent exercise;Long Term: Able to check pulse independently and accurately       Understanding of Exercise Prescription  Yes       Intervention  Provide education, explanation, and written materials on patient's individual exercise prescription       Expected Outcomes  Short Term: Able to explain program exercise prescription;Long Term: Able to explain home exercise prescription to exercise independently          Exercise Goals Re-Evaluation : Exercise Goals Re-Evaluation    Row Name 12/10/17 1013 12/22/17 1522 01/05/18 1249 01/19/18 1507 02/14/18 1107     Exercise Goal Re-Evaluation   Exercise Goals Review  Able to understand and use rate of perceived exertion (RPE) scale;Knowledge and understanding of Target Heart Rate Range (THRR);Understanding of Exercise Prescription;Increase Physical Activity;Able to understand and use Dyspnea scale;Increase Strength and Stamina  Increase Physical Activity;Able to understand and use rate of perceived exertion (RPE) scale;Knowledge and understanding of Target Heart Rate Range (THRR);Increase Strength and Stamina;Able to understand and use Dyspnea scale  Increase Physical Activity;Able to understand and use rate of perceived exertion (RPE) scale;Increase Strength and Stamina  Increase Physical Activity;Able to understand and use rate of perceived exertion (RPE) scale;Increase Strength and Stamina;Able to understand and use Dyspnea scale  Increase Physical Activity;Able to understand and use rate of perceived exertion (RPE) scale;Increase Strength and Stamina;Able to understand and use  Dyspnea scale   Comments  Reviewed RPE scale, THR and program prescription with pt today.  Pt voiced understanding and was given a copy of goals to take home.    Reviewed home exercise with pt today.  Pt plans to use TM at home for exercise.  Reviewed THR, pulse, RPE, sign and symptoms, NTG use, and when to call 911 or MD.  Also discussed weather considerations and indoor options.  Pt voiced understanding.  Jerry Jimenez has added speed and incline to TM and increased resistance on RB.  He is porgressing well.  Staff will continue to monitor progress  Jerry Jimenez is progressing well with exercise.  He has increased overall MET level on all equipment.   Jerry Jimenez has been out of [facility] for a little while due to his surgery. He is ready to get back to exercising to improve his shortness of breath.    Expected Outcomes  Short: Use RPE daily to regulate intensity.  Long: Follow program prescription in THR.  Short - Pt will add one day at home in addition to program sessions Long - Pt will exercise independently  Short - Jerry Jimenez will continue  to attend regularly Long - Jerry Jimenez will increase overall MET level   Short - Jerry Jimenez will continue to attend regularly.  Long - Jerry Jimenez will maintain fitness gains  Short: increase levels on all exercises. Long: Maintain exercising post [facility].   [place] Name 02/17/18 1042             Exercise Goal Re-Evaluation   Exercise Goals Review  Increase Physical Activity;Able to understand and use rate of perceived exertion (RPE) scale;Increase Strength and Stamina;Able to understand and use Dyspnea scale       Comments  Jerry Jimenez is progressing well since returning from surgery.  He is working towards previous level on the TM.       Expected Outcomes  Short - Jerry Jimenez will continue to attend and build up to previous level Long - Jerry Jimenez will complete LW and improve overall fitness          Discharge Exercise Prescription (Final Exercise Prescription Changes): Exercise Prescription Changes - 02/17/18 1000      Response to  Exercise   Blood Pressure (Admit)  146/80    Blood Pressure (Exit)  134/60    Heart Rate (Admit)  63 bpm    Heart Rate (Exercise)  103 bpm    Heart Rate (Exit)  82 bpm    Oxygen Saturation (Admit)  97 %    Oxygen Saturation (Exercise)  91 %    Oxygen Saturation (Exit)  93 %    Rating of Perceived Exertion (Exercise)  12    Perceived Dyspnea (Exercise)  2    Symptoms  none    Duration  Continue with 45 min of aerobic exercise without signs/symptoms of physical distress.    Intensity  THRR unchanged      Progression   Progression  Continue to progress workloads to maintain intensity without signs/symptoms of physical distress.      Treadmill   MPH  2    Grade  0.5    Minutes  15    METs  3.15      Recumbant Bike   Level  6    RPM  63    Watts  44    Minutes  15    METs  3.4       Nutrition:  Target Goals: Understanding of nutrition guidelines, daily intake of sodium '1500mg'$ , cholesterol '200mg'$ , calories 30% from fat and 7% or less from saturated fats, daily to have 5 or more servings of fruits and vegetables.  Biometrics: Pre Biometrics - 11/30/17 1304      Pre Biometrics   Height  5' 10.25" (1.784 m)    Weight  235 lb 12.8 oz (107 kg)    Waist Circumference  45 inches    Hip Circumference  45.5 inches    Waist to Hip Ratio  0.99 %    BMI (Calculated)  33.61        Nutrition Therapy Plan and Nutrition Goals: Nutrition Therapy & Goals - 12/22/17 1036      Nutrition Therapy   Diet  TLC    Protein (specify units)  9oz    Fiber  37 grams    Saturated Fats  16 max. grams    Fruits and Vegetables  4 servings/day    Sodium  2000 grams      Personal Nutrition Goals   Nutrition Goal  Choose more "wholesome" snacks to help decrease hunger throughout the day, for example add a fruit / protein source    Personal  Goal #2  Include sources of fiber and protein at each meal    Personal Goal #3  Increase intake of vegetables each week      Intervention Plan    Intervention  Prescribe, educate and counsel regarding individualized specific dietary modifications aiming towards targeted core components such as weight, hypertension, lipid management, diabetes, heart failure and other comorbidities.    Expected Outcomes  Short Term Goal: Understand basic principles of dietary content, such as calories, fat, sodium, cholesterol and nutrients.;Short Term Goal: A plan has been developed with personal nutrition goals set during dietitian appointment.;Long Term Goal: Adherence to prescribed nutrition plan.       Nutrition Assessments: Nutrition Assessments - 11/30/17 1120      MEDFICTS Scores   Pre Score  60       Nutrition Goals Re-Evaluation: Nutrition Goals Re-Evaluation    Row Name 12/27/17 1028 02/14/18 1025           Goals   Current Weight  230 lb 1.6 oz (104.4 kg)  216 lb 12.8 oz (98.3 kg)      Nutrition Goal  Lose weight and eat Healthier.  Lose a litle more weight and eat less      Comment  He has been eatring more fruit and vegetables. He takes vitamins but no protien of suppliments. He is going to work on eating supper earlier.  He has been trying to eat less and has lose a good amount of weight. Jerry Jimenez has been putting less food on his plate to try and lose weight. He would like to be 200 pounds or less for his weight goal.      Expected Outcome  Short: eat dinner earlier. Long: maintain eating dinner earlier and not so close to bed time.  Short: lose 5 more pounds. Long: reach a weight of 200 pounds or less.         Nutrition Goals Discharge (Final Nutrition Goals Re-Evaluation): Nutrition Goals Re-Evaluation - 02/14/18 1025      Goals   Current Weight  216 lb 12.8 oz (98.3 kg)    Nutrition Goal  Lose a litle more weight and eat less    Comment  He has been trying to eat less and has lose a good amount of weight. Jerry Jimenez has been putting less food on his plate to try and lose weight. He would like to be 200 pounds or less for his weight goal.     Expected Outcome  Short: lose 5 more pounds. Long: reach a weight of 200 pounds or less.       Psychosocial: Target Goals: Acknowledge presence or absence of significant depression and/or stress, maximize coping skills, provide positive support system. Participant is able to verbalize types and ability to use techniques and skills needed for reducing stress and depression.   Initial Review & Psychosocial Screening: Initial Psych Review & Screening - 11/30/17 1118      Initial Review   Current issues with  Current Sleep Concerns;Current Stress Concerns    Source of Stress Concerns  Unable to perform yard/household activities;Chronic Illness    Comments  he has been having trouble sleeping lately and gets short of breath at times.      Family Dynamics   Good Support System?  Yes    Comments  patient has 2 sons and a daughter that live within 10 miles of him.      Barriers   Psychosocial barriers to participate in program  The patient  should benefit from training in stress management and relaxation.      Screening Interventions   Interventions  Encouraged to exercise;Provide feedback about the scores to participant;Program counselor consult;To provide support and resources with identified psychosocial needs    Expected Outcomes  Short Term goal: Utilizing psychosocial counselor, staff and physician to assist with identification of specific Stressors or current issues interfering with healing process. Setting desired goal for each stressor or current issue identified.;Long Term Goal: Stressors or current issues are controlled or eliminated.;Short Term goal: Identification and review with participant of any Quality of Life or Depression concerns found by scoring the questionnaire.;Long Term goal: The participant improves quality of Life and PHQ9 Scores as seen by post scores and/or verbalization of changes       Quality of Life Scores:  Scores of 19 and below usually indicate a poorer  quality of life in these areas.  A difference of  2-3 points is a clinically meaningful difference.  A difference of 2-3 points in the total score of the Quality of Life Index has been associated with significant improvement in overall quality of life, self-image, physical symptoms, and general health in studies assessing change in quality of life.  PHQ-9: Recent Review Flowsheet Data    Depression screen [facility] 2/9 01/05/2018 11/30/2017 07/20/2017   Decreased Interest 0 2 1   Down, Depressed, Hopeless 0 0 0   PHQ - 2 Score 0 2 1   Altered sleeping '2 3 3   '$ Tired, decreased energy '2 3 3   '$ Change in appetite 2 0 3   Feeling bad or failure about yourself  0 0 0   Trouble concentrating 0 0 0   Moving slowly or fidgety/restless 0 0 0   Suicidal thoughts 0 0 0   PHQ-9 Score '6 8 10   '$ Difficult doing work/chores Somewhat difficult Somewhat difficult Not difficult at all     Interpretation of Total Score  Total Score Depression Severity:  1-4 = Minimal depression, 5-9 = Mild depression, 10-14 = Moderate depression, 15-19 = Moderately severe depression, 20-27 = Severe depression   Psychosocial Evaluation and Intervention: Psychosocial Evaluation - 12/15/17 1043      Psychosocial Evaluation & Interventions   Interventions  Encouraged to exercise with the program and follow exercise prescription    Comments  Counselor met with Jerry Jimenez [facility]) today for initial psychosocial evaluation.  He is an 84 year old who has COPD.  Jerry Jimenez has a strong support system with a spouse of 30 years and (3) adult children who live locally.  He has chronic sleep problems - although he reports sleeping "all night last night for the first time in 5 years!"  He takes an OTC Melatonin to help with this.  Jerry Jimenez states his appetite is "too good" and he has goals to lose weight while in this program.  He reports a history of anxiety and takes medication for this that he says is "helpful."  Jerry Jimenez is typically in a positive mood and has  minimal stress in his life other than his health at this time.  He also has goals just to breathe better in general.  Staff will follow with Jerry Jimenez throughout the course of this program.      Expected Outcomes  Jerry Jimenez will benefit from consistent exercise to achieve his stated goals.  The educational and psychoeducational components of this program will be helpful in understanding and coping with his condition in a more positive way.  Staff  will follow with him.     Continue Psychosocial Services   Follow up required by staff       Psychosocial Re-Evaluation: Psychosocial Re-Evaluation    [place] Name 12/27/17 1023 02/07/18 1051 02/14/18 1028         Psychosocial Re-Evaluation   Current issues with  Current Stress Concerns;Current Sleep Concerns  -  Current Stress Concerns;Current Sleep Concerns     Comments  He has not been sleeping to well lately. He takes melatonin to help him sleep but lately he has been waking up. Sometimes he has to get up to use the bathroom. He is going to try to stop eating and drinking before 730pm  to help reduce waking up in the middle of the night.  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  He is feeling stronger since the surgery, but is still very tired.  Jerry Jimenez states he is also continuing to have difficulty with intermittent sleep currently.  This has been a chronic problem for him in the past.  His mood remains positive and his appetite has returned.  Counselor commended Jerry Jimenez on his commitment to exercise and getting back on track.  He will be followed.    Jerry Jimenez sleeps about 2 hours at a time. He does not know why he wakes up so much. His breathing is his main stress concern since after his surgery. His daughter has to get an opperation on her spinal cord and is putting some stress on him.      Expected Outcomes  Short: try not to eat or drink past 730 for sleep. Long:  Maintain eating  before 730pm.  Short - Jerry Jimenez will exercise to increase his energy and recovery from his recent hospitalization.  Long - Jerry Jimenez will continue to work with his Dr. on his chronic sleep problem and medications that are not being effective.  Short: continue to exercise to improve shortness of breath. Long: Graduate lungworks and continue exercising to reduce stress.     Interventions  Encouraged to attend Pulmonary Rehabilitation for the exercise  Stress management education;Relaxation education  Encouraged to attend Pulmonary Rehabilitation for the exercise     Continue Psychosocial Services   -  Follow up required by staff  Follow up required by staff        Psychosocial Discharge (Final Psychosocial Re-Evaluation): Psychosocial Re-Evaluation - 02/14/18 1028      Psychosocial Re-Evaluation   Current issues with  Current Stress Concerns;Current Sleep Concerns    Comments  Jerry Jimenez sleeps about 2 hours at a time. He does not know why he wakes up so much. His breathing is his main stress concern since after his surgery. His daughter has to get an opperation on her spinal cord and is putting some stress on him.     Expected Outcomes  Short: continue to exercise to improve shortness of breath. Long: Graduate lungworks and continue exercising to reduce stress.    Interventions  Encouraged to attend Pulmonary Rehabilitation for the exercise    Continue Psychosocial Services   Follow up required by staff       Education: Education Goals: Education classes will be provided on a weekly basis, covering required topics. Participant will state understanding/return demonstration of topics presented.  Learning Barriers/Preferences: Learning Barriers/Preferences - 11/30/17 1125      Learning Barriers/Preferences   Learning Barriers  Reading;Sight wears  glasses    Learning Preferences  Individual Instruction;Verbal Instruction;Video       Education Topics:  Initial Evaluation Education: -  Verbal, written and demonstration of respiratory meds, oximetry and breathing techniques. Instruction on use of nebulizers and MDIs and importance of monitoring MDI activations.   Pulmonary Rehab from 02/23/2018 in [facility] Cardiac and Pulmonary Rehab  Date  11/30/17  Educator  [facility]  Instruction Review Code  1- Verbalizes Understanding      General Nutrition Guidelines/Fats and Fiber: -Group instruction provided by verbal, written material, models and posters to present the general guidelines for heart healthy nutrition. Gives an explanation and review of dietary fats and fiber.   Pulmonary Rehab from 02/23/2018 in [facility] Cardiac and Pulmonary Rehab  Date  02/09/18  Educator  [facility]  Instruction Review Code  1- Verbalizes Understanding      Controlling Sodium/Reading Food Labels: -Group verbal and written material supporting the discussion of sodium use in heart healthy nutrition. Review and explanation with models, verbal and written materials for utilization of the food label.   Exercise Physiology & General Exercise Guidelines: - Group verbal and written instruction with models to review the exercise physiology of the cardiovascular system and associated critical values. Provides general exercise guidelines with specific guidelines to those with heart or lung disease.    Aerobic Exercise & Resistance Training: - Gives group verbal and written instruction on the various components of exercise. Focuses on aerobic and resistive training programs and the benefits of this training and how to safely progress through these programs.   Pulmonary Rehab from 02/23/2018 in [facility] Cardiac and Pulmonary Rehab  Date  02/18/18  Educator  [facility]  Instruction Review Code  1- Verbalizes Understanding      Flexibility, Balance, Mind/Body Relaxation: Provides group verbal/written instruction on the benefits of flexibility and balance training, including mind/body exercise modes such as yoga, pilates and tai chi.   Demonstration and skill practice provided.   Stress and Anxiety: - Provides group verbal and written instruction about the health risks of elevated stress and causes of high stress.  Discuss the correlation between heart/lung disease and anxiety and treatment options. Review healthy ways to manage with stress and anxiety.   Pulmonary Rehab from 02/23/2018 in [facility] Cardiac and Pulmonary Rehab  Date  12/29/17  Educator  [facility]  Instruction Review Code  1- Verbalizes Understanding      Depression: - Provides group verbal and written instruction on the correlation between heart/lung disease and depressed mood, treatment options, and the stigmas associated with seeking treatment.   Pulmonary Rehab from 02/23/2018 in [facility] Cardiac and Pulmonary Rehab  Date  12/15/17  Educator  [facility]  Instruction Review Code  1- Verbalizes Understanding      Exercise & Equipment Safety: - Individual verbal instruction and demonstration of equipment use and safety with use of the equipment.   Pulmonary Rehab from 02/23/2018 in [facility] Cardiac and Pulmonary Rehab  Date  11/30/17  Educator  [facility]  Instruction Review Code  1- Verbalizes Understanding      Infection Prevention: - Provides verbal and written material to individual with discussion of infection control including proper hand washing and proper equipment cleaning during exercise session.   Pulmonary Rehab from 02/23/2018 in [facility] Cardiac and Pulmonary Rehab  Date  11/30/17  Educator  [facility]  Instruction Review Code  1- Verbalizes Understanding      Falls Prevention: - Provides verbal and written material to individual with discussion of falls prevention and safety.  Pulmonary Rehab from 02/23/2018 in [facility] Cardiac and Pulmonary Rehab  Date  11/30/17  Educator  [facility]  Instruction Review Code  1- Verbalizes Understanding      Diabetes: - Individual verbal and written instruction to review signs/symptoms of diabetes, desired ranges of glucose level fasting, after meals  and with exercise. Advice that pre and post exercise glucose checks will be done for 3 sessions at entry of program.   Chronic Lung Diseases: - Group verbal and written instruction to review updates, respiratory medications, advancements in procedures and treatments. Discuss use of supplemental oxygen including available portable oxygen systems, continuous and intermittent flow rates, concentrators, personal use and safety guidelines. Review proper use of inhaler and spacers. Provide informative websites for self-education.    Energy Conservation: - Provide group verbal and written instruction for methods to conserve energy, plan and organize activities. Instruct on pacing techniques, use of adaptive equipment and posture/positioning to relieve shortness of breath.   Pulmonary Rehab from 02/23/2018 in [facility] Cardiac and Pulmonary Rehab  Date  12/22/17  Educator  [facility]  Instruction Review Code  1- Verbalizes Understanding      Triggers and Exacerbations: - Group verbal and written instruction to review types of environmental triggers and ways to prevent exacerbations. Discuss weather changes, air quality and the benefits of nasal washing. Review warning signs and symptoms to help prevent infections. Discuss techniques for effective airway clearance, coughing, and vibrations.   Pulmonary Rehab from 02/23/2018 in [facility] Cardiac and Pulmonary Rehab  Date  02/16/18  Educator  [facility]  Instruction Review Code  1- Verbalizes Understanding      AED/CPR: - Group verbal and written instruction with the use of models to demonstrate the basic use of the AED with the basic ABC's of resuscitation.   Pulmonary Rehab from 02/23/2018 in [facility] Cardiac and Pulmonary Rehab  Date  12/24/17  Educator  [facility]  Instruction Review Code  1- Actuary and Physiology of the Lungs: - Group verbal and written instruction with the use of models to provide basic lung anatomy and physiology related to  function, structure and complications of lung disease.   Pulmonary Rehab from 02/23/2018 in [facility] Cardiac and Pulmonary Rehab  Date  01/05/18  Educator  [facility]  Instruction Review Code  1- Verbalizes Understanding      Anatomy & Physiology of the Heart: - Group verbal and written instruction and models provide basic cardiac anatomy and physiology, with the coronary electrical and arterial systems. Review of Valvular disease and Heart Failure   Pulmonary Rehab from 02/23/2018 in [facility] Cardiac and Pulmonary Rehab  Date  02/23/18  Educator  [facility]  Instruction Review Code  1- Verbalizes Understanding      Cardiac Medications: - Group verbal and written instruction to review commonly prescribed medications for heart disease. Reviews the medication, class of the drug, and side effects.   Pulmonary Rehab from 02/23/2018 in [facility] Cardiac and Pulmonary Rehab  Date  12/10/17  Educator  [facility]  Instruction Review Code  1- Verbalizes Understanding      Know Your Numbers and Risk Factors: -Group verbal and written instruction about important numbers in your health.  Discussion of what are risk factors and how they play a role in the disease process.  Review of Cholesterol, Blood Pressure, Diabetes, and BMI and the role they play in your overall health.   Sleep Hygiene: -Provides group verbal and written instruction about how sleep can affect your health.  Define sleep hygiene, discuss sleep cycles and impact of sleep habits. Review good sleep hygiene tips.    Other: -Provides group and verbal instruction on various topics (see comments)    Knowledge Questionnaire Score: Knowledge Questionnaire Score - 11/30/17 1125      Knowledge Questionnaire Score   Pre Score  16/18 reviewed with patient        Core Components/Risk Factors/Patient Goals at Admission: Personal Goals and Risk Factors at Admission - 11/30/17 1128      Core Components/Risk Factors/Patient Goals on Admission    Weight Management   Yes;Weight Loss    Intervention  Weight Management: Develop a combined nutrition and exercise program designed to reach desired caloric intake, while maintaining appropriate intake of nutrient and fiber, sodium and fats, and appropriate energy expenditure required for the weight goal.;Weight Management: Provide education and appropriate resources to help participant work on and attain dietary goals.;Weight Management/Obesity: Establish reasonable short term and long term weight goals.;Obesity: Provide education and appropriate resources to help participant work on and attain dietary goals.    Admit Weight  235 lb 12.8 oz (107 kg)    Goal Weight: Short Term  230 lb (104.3 kg)    Goal Weight: Long Term  200 lb (90.7 kg)    Expected Outcomes  Short Term: Continue to assess and modify interventions until short term weight is achieved;Weight Maintenance: Understanding of the daily nutrition guidelines, which includes 25-35% calories from fat, 7% or less cal from saturated fats, less than '200mg'$  cholesterol, less than 1.5gm of sodium, & 5 or more servings of fruits and vegetables daily;Long Term: Adherence to nutrition and physical activity/exercise program aimed toward attainment of established weight goal;Weight Loss: Understanding of general recommendations for a balanced deficit meal plan, which promotes 1-2 lb weight loss per week and includes a negative energy balance of [phone] kcal/d;Understanding recommendations for meals to include 15-35% energy as protein, 25-35% energy from fat, 35-60% energy from carbohydrates, less than '200mg'$  of dietary cholesterol, 20-35 gm of total fiber daily;Understanding of distribution of calorie intake throughout the day with the consumption of 4-5 meals/snacks    Improve shortness of breath with ADL's  Yes    Intervention  Provide education, individualized exercise plan and daily activity instruction to help decrease symptoms of SOB with activities of daily living.     Expected Outcomes  Short Term: Improve cardiorespiratory fitness to achieve a reduction of symptoms when performing ADLs;Long Term: Be able to perform more ADLs without symptoms or delay the onset of symptoms    Hypertension  Yes    Intervention  Provide education on lifestyle modifcations including regular physical activity/exercise, weight management, moderate sodium restriction and increased consumption of fresh fruit, vegetables, and low fat dairy, alcohol moderation, and smoking cessation.;Monitor prescription use compliance.    Expected Outcomes  Short Term: Continued assessment and intervention until BP is < 140/76m HG in hypertensive participants. < 130/881mHG in hypertensive participants with diabetes, heart failure or chronic kidney disease.;Long Term: Maintenance of blood pressure at goal levels.    Lipids  Yes    Intervention  Provide education and support for participant on nutrition & aerobic/resistive exercise along with prescribed medications to achieve LDL '70mg'$ , HDL >'40mg'$ .    Expected Outcomes  Short Term: Participant states understanding of desired cholesterol values and is compliant with medications prescribed. Participant is following exercise prescription and nutrition guidelines.;Long Term: Cholesterol controlled with medications as prescribed, with individualized exercise RX and with personalized nutrition plan. Value goals:  LDL < '70mg'$ , HDL > 40 mg.       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 His weight has gone down 5 pounds. He wants to continue with his weight  loss. [place] blood pressure is getting better since he started the program.  Jerry Jimenez's weight has improved since he started the program. His blood pressure has also seen an improvement. He has been doing well in the program but his shortness of breath took a hit since his surgery.      Expected Outcomes  Short: continue [facility] to lose more weight. Long: maintain an exercise routine to continue weight loss  Short: continue to exercise to regain strength. Long: maintain exericse to improve ADL.         Core Components/Risk Factors/Patient Goals at Discharge (Final Review):  Goals and Risk Factor Review - 02/14/18 1055      Core Components/Risk Factors/Patient Goals Review   Personal Goals Review  Weight Management/Obesity;Improve shortness of breath with ADL's;Lipids;Hypertension    Review  Jerry Jimenez's weight has improved since he started the program. His blood pressure has also seen an improvement. He has been doing well in the program but his shortness of breath took a hit since his surgery.    Expected Outcomes  Short: continue to exercise to regain strength. Long: maintain exericse to improve ADL.       ITP Comments: ITP Comments    Row Name 11/30/17 1143 11/30/17 1144 12/06/17 0905 12/29/17 1026 01/03/18 0815   ITP Comments  Medical Evaluation completed. Chart sent for review and changes to Dr. Emily Filbert Director of [place]. Diagnosis can be found in [facility] encounter 11/15/17  Met face to face with Doctor Emily Filbert director of [place]. Chart Reviewed and signed.  30 day review completed. ITP sent to Dr. Emily Filbert Director of [place]. Continue with ITP unless changes are made by physician.  Patient met with Dr Sabra Heck director of [place] for face to face. Chart reviwed and signed. Continue with exercise prescription unless told otherwise.  30 day review completed. ITP sent to Dr. Emily Filbert Director of [place]. Continue with ITP unless changes are made by physician.   [place] Name 01/31/18 0820  01/31/18 0821 01/31/18 1609 02/07/18 0944 02/28/18 0820   ITP Comments  Patient has not been in class due to a procedure and was not able to meet Dr. Sabra Heck face to face on 01/28/18.   30 day review completed. ITP sent to Dr. Emily Filbert Director of [place]. Continue with ITP unless changes are made by physician  Spoke to Jerry Jimenez about him returning to [place]. He was released from the hospital yesterday. Informed her that He will need a note from the doctor that it is ok to exercise. Gave patients Jimenez fax number. Patients Jimenez verbalizes understanding.  Patient met with Dr. Lequita Halt who signed for Dr. Emily Filbert director of [place] for face to face time. Chart reviewed and signed.   30 day review completed. ITP sent to Dr. Emily Filbert Director of [place]. Continue with ITP unless changes are made by physician  Comments: 30 day review

## 2018-02-28 NOTE — Progress Notes (Signed)
Daily Session Note  Patient Details  Name: OSRIC KLOPF MRN: [id number] Date of Birth: [birth year] Referring Provider:     Pulmonary Rehab from 11/30/2017 in [facility] Cardiac and Pulmonary Rehab  Referring Provider  Sabra Heck      Encounter Date: 02/28/2018  Check In: Session Check In - 02/28/18 1049      Check-In   Location  [facility]-Cardiac & Pulmonary Rehab    Staff Present  Earlean Shawl, BS, ACSM CEP, Exercise Physiologist;Amanda Oletta Darter, BA, ACSM CEP, Exercise Physiologist;Boyce Keltner Flavia Shipper    Supervising physician immediately available to respond to emergencies  [facility] immediately available ER MD    Physician(s)  Dr. Kerman Passey and Rifenbark    Medication changes reported      No    Fall or balance concerns reported     No    Tobacco Cessation  No Change    Warm-up and Cool-down  Performed as group-led instruction    Resistance Training Performed  Yes    VAD Patient?  No      Pain Assessment   Currently in Pain?  No/denies          Social History   Tobacco Use  Smoking Status Former Smoker  . Packs/day: 3.50  . Years: 35.00  . Pack years: 122.50  . Types: Cigarettes  . Last attempt to quit: 07/06/1987  . Years since quitting: 30.6  Smokeless Tobacco Former Systems developer  . Types: Chew  . Quit date: 07/05/2002    Goals Met:  Independence with exercise equipment Exercise tolerated well No report of cardiac concerns or symptoms Strength training completed today  Goals Unmet:  Not Applicable  Comments: Pt able to follow exercise prescription today without complaint.  Will continue to monitor for progression.   Dr. Emily Filbert is Medical Director for [place] and [facility].

## 2018-03-02 DIAGNOSIS — J449 Chronic obstructive pulmonary disease, unspecified: Secondary | ICD-10-CM

## 2018-03-02 NOTE — Progress Notes (Signed)
Daily Session Note  Patient Details  Name: Jerry Jimenez MRN: [id number] Date of Birth: October 11, 1934 Referring Provider:     Pulmonary Rehab from 11/30/2017 in [facility] Cardiac and Pulmonary Rehab  Referring Provider  Sabra Heck      Encounter Date: 03/02/2018  Check In: Session Check In - 03/02/18 1009      Check-In   Location  [facility]Cardiac & Pulmonary Rehab    Staff Present  Alberteen Sam, MA, RCEP, CCRP, Exercise Physiologist;Kadi Hession Oletta Darter, [place], ACSM CEP, Exercise Physiologist;Joseph Flavia Shipper    Supervising physician immediately available to respond to emergencies  See telemetry face sheet for immediately available ER MD    Physician(s)  Jimmye Norman and Burlene Arnt    Medication changes reported      No    Fall or balance concerns reported     No    Warm-up and Cool-down  Performed as group-led instruction    Resistance Training Performed  Yes    VAD Patient?  No      Pain Assessment   Currently in Pain?  No/denies    Multiple Pain Sites  No          Social History   Tobacco Use  Smoking Status Former Smoker  . Packs/day: 3.50  . Years: 35.00  . Pack years: 122.50  . Types: Cigarettes  . Last attempt to quit: 07/06/1987  . Years since quitting: 30.6  Smokeless Tobacco Former Systems developer  . Types: Chew  . Quit date: 07/05/2002    Goals Met:  Independence with exercise equipment Exercise tolerated well No report of cardiac concerns or symptoms Strength training completed today  Goals Unmet:  Not Applicable  Comments: Pt able to follow exercise prescription today without complaint.  Will continue to monitor for progression.    Dr. Emily Filbert is Medical Director for [place] and [facility].

## 2018-03-04 ENCOUNTER — Encounter: Payer: Medicare Other | Admitting: *Deleted

## 2018-03-04 DIAGNOSIS — J449 Chronic obstructive pulmonary disease, unspecified: Secondary | ICD-10-CM

## 2018-03-04 NOTE — Progress Notes (Signed)
Daily Session Note  Patient Details  Name: Jerry Jimenez MRN: [id number] Date of Birth: 1934-01-20 Referring Provider:     Pulmonary Rehab from 11/30/2017 in [facility] Cardiac and Pulmonary Rehab  Referring Provider  Sabra Heck      Encounter Date: 03/04/2018  Check In: Session Check In - 03/04/18 1028      Check-In   Location  [facility]Cardiac & Pulmonary Rehab    Staff Present  Renita Papa, RN Vickki Hearing, BA, ACSM CEP, Exercise Physiologist Constance Goltz RN    Supervising physician immediately available to respond to emergencies  [facility] immediately available ER MD    Physician(s)  Dr. Clearnce Hasten and Alfred Levins    Medication changes reported      No    Fall or balance concerns reported     No    Tobacco Cessation  No Change    Warm-up and Cool-down  Performed as group-led instruction    Resistance Training Performed  Yes    VAD Patient?  No      Pain Assessment   Currently in Pain?  No/denies          Social History   Tobacco Use  Smoking Status Former Smoker  . Packs/day: 3.50  . Years: 35.00  . Pack years: 122.50  . Types: Cigarettes  . Last attempt to quit: 07/06/1987  . Years since quitting: 30.6  Smokeless Tobacco Former Systems developer  . Types: Chew  . Quit date: 07/05/2002    Goals Met:  Proper associated with RPD/PD & O2 Sat Independence with exercise equipment Using PLB without cueing & demonstrates good technique Exercise tolerated well No report of cardiac concerns or symptoms Strength training completed today  Goals Unmet:  Not Applicable  Comments: Pt able to follow exercise prescription today without complaint.  Will continue to monitor for progression.    Dr. Emily Filbert is Medical Director for [place] and [facility].

## 2018-03-07 ENCOUNTER — Encounter: Payer: Medicare Other | Admitting: *Deleted

## 2018-03-07 DIAGNOSIS — J449 Chronic obstructive pulmonary disease, unspecified: Secondary | ICD-10-CM

## 2018-03-07 NOTE — Progress Notes (Signed)
Daily Session Note  Patient Details  Name: Jerry Jimenez MRN: [id number] Date of Birth: [birth year] Referring Provider:     Pulmonary Rehab from 11/30/2017 in [facility] Cardiac and Pulmonary Rehab  Referring Provider  Sabra Heck      Encounter Date: 03/07/2018  Check In: Session Check In - 03/07/18 1025      Check-In   Location  [facility]Cardiac & Pulmonary Rehab    Staff Present  Nyoka Cowden, RN, BSN, [place]ita [place], BS, ACSM CEP, Exercise Physiologist;Amanda Oletta Darter, [place], ACSM CEP, Exercise Physiologist    Supervising physician immediately available to respond to emergencies  [facility] immediately available ER MD    Physician(s)  Dr. Corky Downs and Dr. Alfred Levins    Medication changes reported      No    Fall or balance concerns reported     No    Tobacco Cessation  No Change    Warm-up and Cool-down  Performed as group-led instruction    Resistance Training Performed  Yes    VAD Patient?  No      Pain Assessment   Currently in Pain?  No/denies    Multiple Pain Sites  No          Social History   Tobacco Use  Smoking Status Former Smoker  . Packs/day: 3.50  . Years: 35.00  . Pack years: 122.50  . Types: Cigarettes  . Last attempt to quit: 07/06/1987  . Years since quitting: 30.6  Smokeless Tobacco Former Systems developer  . Types: Chew  . Quit date: 07/05/2002    Goals Met:  Proper associated with RPD/PD & O2 Sat Independence with exercise equipment Exercise tolerated well No report of cardiac concerns or symptoms Strength training completed today  Goals Unmet:  Not Applicable  Comments: Pt able to follow exercise prescription today without complaint.  Will continue to monitor for progression.    Dr. Emily Filbert is Medical Director for [place] and [facility].

## 2018-03-09 DIAGNOSIS — J449 Chronic obstructive pulmonary disease, unspecified: Secondary | ICD-10-CM

## 2018-03-09 NOTE — Progress Notes (Signed)
Daily Session Note  Patient Details  Name: Jerry Jimenez MRN: [id number] Date of Birth: 03-30-1934 Referring Provider:     Pulmonary Rehab from 11/30/2017 in [facility] Cardiac and Pulmonary Rehab  Referring Provider  Sabra Heck      Encounter Date: 03/09/2018  Check In: Session Check In - 03/09/18 1016      Check-In   Location  [facility]Cardiac & Pulmonary Rehab    Staff Present  Nyoka Cowden, RN, BSN, Willette Pa, MA, RCEP, CCRP, Exercise Physiologist;Ikechukwu Cerny Oletta Darter, [place], ACSM CEP, Exercise Physiologist    Supervising physician immediately available to respond to emergencies  [facility] immediately available ER MD    Physician(s)  Jimmye Norman and Quentin Cornwall    Medication changes reported      No    Fall or balance concerns reported     No    Warm-up and Cool-down  Performed as group-led Higher education careers adviser Performed  Yes    VAD Patient?  No      Pain Assessment   Currently in Pain?  No/denies    Multiple Pain Sites  No          Social History   Tobacco Use  Smoking Status Former Smoker  . Packs/day: 3.50  . Years: 35.00  . Pack years: 122.50  . Types: Cigarettes  . Last attempt to quit: 07/06/1987  . Years since quitting: 30.6  Smokeless Tobacco Former Systems developer  . Types: Chew  . Quit date: 07/05/2002    Goals Met:  Proper associated with RPD/PD & O2 Sat Independence with exercise equipment Exercise tolerated well Strength training completed today  Goals Unmet:  Not Applicable  Comments: Pt able to follow exercise prescription today without complaint.  Will continue to monitor for progression.    Dr. Emily Filbert is Medical Director for [place] and [facility].

## 2018-03-09 NOTE — Progress Notes (Signed)
Daily Session Note  Patient Details  Name: Jerry Jimenez MRN: [id number] Date of Birth: [birth year] Referring Provider:     Pulmonary Rehab from 11/30/2017 in [facility] Cardiac and Pulmonary Rehab  Referring Provider  Sabra Heck      Encounter Date: 03/09/2018  Check In: Session Check In - 03/09/18 1016      Check-In   Location  [facility]Cardiac & Pulmonary Rehab    Staff Present  Nyoka Cowden, RN, BSN, Willette Pa, MA, RCEP, CCRP, Exercise Physiologist;Amanda Oletta Darter, [place], ACSM CEP, Exercise Physiologist    Supervising physician immediately available to respond to emergencies  [facility] immediately available ER MD    Physician(s)  Jimmye Norman and Quentin Cornwall    Medication changes reported      No    Fall or balance concerns reported     No    Warm-up and Cool-down  Performed as group-led Higher education careers adviser Performed  Yes    VAD Patient?  No      Pain Assessment   Currently in Pain?  No/denies    Multiple Pain Sites  No          Social History   Tobacco Use  Smoking Status Former Smoker  . Packs/day: 3.50  . Years: 35.00  . Pack years: 122.50  . Types: Cigarettes  . Last attempt to quit: 07/06/1987  . Years since quitting: 30.6  Smokeless Tobacco Former Systems developer  . Types: Chew  . Quit date: 07/05/2002    Goals Met:  Independence with exercise equipment Exercise tolerated well Personal goals reviewed No report of cardiac concerns or symptoms Strength training completed today  Goals Unmet:  Not Applicable  Comments: Pt able to follow exercise prescription today without complaint.  Will continue to monitor for progression. Met face to face with Dr. Ramonita Lab (Dr. Emily Filbert, Medical Director).  Chart was reviewed and signed.  See ITP for goal review.  Dr. Emily Filbert is Medical Director for [place] and [facility].

## 2018-03-11 ENCOUNTER — Encounter: Payer: Medicare Other | Admitting: *Deleted

## 2018-03-11 DIAGNOSIS — J449 Chronic obstructive pulmonary disease, unspecified: Secondary | ICD-10-CM

## 2018-03-11 NOTE — Progress Notes (Signed)
Daily Session Note  Patient Details  Name: Jerry Jimenez MRN: [id number] Date of Birth: [birth year] Referring Provider:     Pulmonary Rehab from 11/30/2017 in [facility] Cardiac and Pulmonary Rehab  Referring Provider  Sabra Heck      Encounter Date: 03/11/2018  Check In: Session Check In - 03/11/18 1019      Check-In   Location  [facility]Cardiac & Pulmonary Rehab    Staff Present  Carson Myrtle, BS, RRT, Respiratory Therapist;Rihana Kiddy Sherryll Burger, RN Vickki Hearing, BA, ACSM CEP, Exercise Physiologist    Supervising physician immediately available to respond to emergencies  [facility] immediately available ER MD    Physician(s)  Dr. Joni Fears and Corky Downs    Medication changes reported      No    Fall or balance concerns reported     No    Tobacco Cessation  No Change    Warm-up and Cool-down  Performed as group-led instruction    Resistance Training Performed  Yes    VAD Patient?  No      Pain Assessment   Currently in Pain?  No/denies          Social History   Tobacco Use  Smoking Status Former Smoker  . Packs/day: 3.50  . Years: 35.00  . Pack years: 122.50  . Types: Cigarettes  . Last attempt to quit: 07/06/1987  . Years since quitting: 30.7  Smokeless Tobacco Former Systems developer  . Types: Chew  . Quit date: 07/05/2002    Goals Met:  Proper associated with RPD/PD & O2 Sat Independence with exercise equipment Using PLB without cueing & demonstrates good technique Exercise tolerated well No report of cardiac concerns or symptoms Strength training completed today  Goals Unmet:  Not Applicable  Comments: Pt able to follow exercise prescription today without complaint.  Will continue to monitor for progression.    Dr. Emily Filbert is Medical Director for [place] and [facility].

## 2018-03-14 ENCOUNTER — Encounter: Payer: Medicare Other | Admitting: *Deleted

## 2018-03-14 DIAGNOSIS — J449 Chronic obstructive pulmonary disease, unspecified: Secondary | ICD-10-CM | POA: Diagnosis not present

## 2018-03-14 NOTE — Progress Notes (Signed)
Daily Session Note  Patient Details  Name: Jerry Jimenez MRN: [id number] Date of Birth: 1934-02-03 Referring Provider:     Pulmonary Rehab from 11/30/2017 in [facility] Cardiac and Pulmonary Rehab  Referring Provider  Sabra Heck      Encounter Date: 03/14/2018  Check In: Session Check In - 03/14/18 1004      Check-In   Location  [facility]Cardiac [facility]monary Rehab    Staff Present  Heath Lark, RN, BSN, CCRP;Amanda Sommer, BA, ACSM CEP, Exercise Physiologist;Kelly Amedeo Plenty, BS, ACSM CEP, Exercise Physiologist    Supervising physician immediately available to respond to emergencies  [facility] immediately available ER MD    Physician(s)  Drs. Schaevitz and Williams    Medication changes reported      No    Fall or balance concerns reported     No    Warm-up and Cool-down  Performed as group-led Higher education careers adviser Performed  Yes    VAD Patient?  No      Pain Assessment   Currently in Pain?  No/denies          Social History   Tobacco Use  Smoking Status Former Smoker  . Packs/day: 3.50  . Years: 35.00  . Pack years: 122.50  . Types: Cigarettes  . Last attempt to quit: 07/06/1987  . Years since quitting: 30.7  Smokeless Tobacco Former Systems developer  . Types: Chew  . Quit date: 07/05/2002    Goals Met:  Proper associated with RPD/PD & O2 Sat Independence with exercise equipment Using PLB without cueing & demonstrates good technique Exercise tolerated well No report of cardiac concerns or symptoms Strength training completed today  Goals Unmet:  Not Applicable  Comments: Pt able to follow exercise prescription today without complaint.  Will continue to monitor for progression.    Dr. Emily Filbert is Medical Director for [place] and [facility].

## 2018-03-16 ENCOUNTER — Encounter: Payer: Medicare Other | Admitting: *Deleted

## 2018-03-16 VITALS — Ht 70.25 in | Wt 213.9 lb

## 2018-03-16 DIAGNOSIS — J449 Chronic obstructive pulmonary disease, unspecified: Secondary | ICD-10-CM

## 2018-03-16 NOTE — Progress Notes (Signed)
Daily Session Note  Patient Details  Name: Jerry Jimenez MRN: [id number] Date of Birth: Mar 13, 1934 Referring Provider:     Pulmonary Rehab from 11/30/2017 in [facility] Cardiac and Pulmonary Rehab  Referring Provider  Sabra Heck      Encounter Date: 03/16/2018  Check In: Session Check In - 03/16/18 1022      Check-In   Location  [facility]Cardiac [facility] Pulmonary Rehab    Staff Present  Alberteen Sam, MA, RCEP, CCRP, Exercise Physiologist;Meredith Sherryll Burger, RN Vickki Hearing, BA, ACSM CEP, Exercise Physiologist    Supervising physician immediately available to respond to emergencies  [facility] immediately available ER MD    Physician(s)  Dr. Alfred Levins and Mariea Clonts    Medication changes reported      No    Fall or balance concerns reported     No    Tobacco Cessation  No Change    Warm-up and Cool-down  Performed as group-led instruction    Resistance Training Performed  Yes    VAD Patient?  No      Pain Assessment   Currently in Pain?  No/denies        Exercise Prescription Changes - 03/15/18 1300      Response to Exercise   Blood Pressure (Admit)  138/70    Blood Pressure (Exit)  120/82    Heart Rate (Admit)  93 bpm    Heart Rate (Exercise)  98 bpm    Heart Rate (Exit)  71 bpm    Oxygen Saturation (Admit)  95 %    Oxygen Saturation (Exercise)  95 %    Oxygen Saturation (Exit)  98 %    Rating of Perceived Exertion (Exercise)  12    Perceived Dyspnea (Exercise)  1    Symptoms  none    Duration  Continue with 45 min of aerobic exercise without signs/symptoms of physical distress.    Intensity  THRR unchanged      Progression   Progression  Continue to progress workloads to maintain intensity without signs/symptoms of physical distress.    Average METs  3.15      Resistance Training   Training Prescription  Yes    Weight  4 lb    Reps  10-15      Interval Training   Interval Training  No      Recumbant Bike   Level  6    Watts  40    Minutes  15    METs  3.3      T5 Nustep    Level  6    Minutes  15    METs  3.1       Social History   Tobacco Use  Smoking Status Former Smoker  . Packs/day: 3.50  . Years: 35.00  . Pack years: 122.50  . Types: Cigarettes  . Last attempt to quit: 07/06/1987  . Years since quitting: 30.7  Smokeless Tobacco Former Systems developer  . Types: Chew  . Quit date: 07/05/2002    Goals Met:  Proper associated with RPD/PD & O2 Sat Independence with exercise equipment Using PLB without cueing & demonstrates good technique Exercise tolerated well No report of cardiac concerns or symptoms Strength training completed today  Goals Unmet:  Not Applicable  Comments: Pt able to follow exercise prescription today without complaint.  Will continue to monitor for progression.    Dr. Emily Filbert is Medical Director for [place] and [facility].

## 2018-03-18 ENCOUNTER — Encounter: Payer: Medicare Other | Admitting: *Deleted

## 2018-03-18 DIAGNOSIS — J449 Chronic obstructive pulmonary disease, unspecified: Secondary | ICD-10-CM | POA: Diagnosis not present

## 2018-03-18 NOTE — Progress Notes (Signed)
Daily Session Note  Patient Details  Name: Jerry Jimenez MRN: [id number] Date of Birth: Jun 14, 1934 Referring Provider:     Pulmonary Rehab from 11/30/2017 in [facility] Cardiac and Pulmonary Rehab  Referring Provider  Sabra Heck      Encounter Date: 03/18/2018  Check In: Session Check In - 03/18/18 1021      Check-In   Location  [facility] Re[facility]    Staff Present  Darel Hong, RN BSN;Nana Addai, RN Vickki Hearing, BA, ACSM CEP, Exercise Physiologist    Supervising physician immediately available to respond to emergencies  [facility] immediately available ER MD    Physician(s)  Drs. Malinda and Williams    Medication changes reported      No    Fall or balance concerns reported     No    Warm-up and Cool-down  Performed as group-led Higher education careers adviser Performed  Yes    VAD Patient?  No      Pain Assessment   Currently in Pain?  No/denies          Social History   Tobacco Use  Smoking Status Former Smoker  . Packs/day: 3.50  . Years: 35.00  . Pack years: 122.50  . Types: Cigarettes  . Last attempt to quit: 07/06/1987  . Years since quitting: 30.7  Smokeless Tobacco Former Systems developer  . Types: Chew  . Quit date: 07/05/2002    Goals Met:  Independence with exercise equipment Using PLB without cueing & demonstrates good technique Exercise tolerated well No report of cardiac concerns or symptoms Strength training completed today  Goals Unmet:  Not Applicable  Comments: Pt able to follow exercise prescription today without complaint.  Will continue to monitor for progression.    Dr. Emily Filbert is Medical Director for [place] and [facility].

## 2018-03-18 NOTE — Patient Instructions (Signed)
Discharge Patient Instructions  Patient Details  Name: Jerry Jimenez MRN: [id number] Date of Birth: 04/02/1934 Referring Provider:  Rusty Aus, MD   Number of Visits: 36/36  Reason for Discharge:  Patient reached a stable level of exercise. Patient independent in their exercise. Patient has met program and personal goals.  Smoking History:  Social History   Tobacco Use  Smoking Status Former Smoker  . Packs/day: 3.50  . Years: 35.00  . Pack years: 122.50  . Types: Cigarettes  . Last attempt to quit: 07/06/1987  . Years since quitting: 30.7  Smokeless Tobacco Former Systems developer  . Types: Chew  . Quit date: 07/05/2002    Diagnosis:  Chronic obstructive pulmonary disease, unspecified COPD type [facility])  Initial Exercise Prescription: Initial Exercise Prescription - 11/30/17 1300      Date of Initial Exercise RX and Referring Provider   Date  11/30/17    Referring Provider  Sabra Heck      Treadmill   MPH  2    Grade  0    Minutes  15    METs  2.53      Recumbant Bike   Level  2    RPM  60    Watts  15    Minutes  15    METs  2.4      NuStep   Level  2    SPM  80    Minutes  15    METs  2.4      T5 Nustep   Level  1    SPM  80    Minutes  15    METs  2.4      Prescription Details   Frequency (times per week)  3    Duration  Progress to 45 minutes of aerobic exercise without signs/symptoms of physical distress      Intensity   THRR 40-80% of Max Heartrate  102-126    Ratings of Perceived Exertion  11-15    Perceived Dyspnea  0-4      Resistance Training   Training Prescription  Yes    Weight  3 lb    Reps  10-15       Discharge Exercise Prescription (Final Exercise Prescription Changes): Exercise Prescription Changes - 03/15/18 1300      Response to Exercise   Blood Pressure (Admit)  138/70    Blood Pressure (Exit)  120/82    Heart Rate (Admit)  93 bpm    Heart Rate (Exercise)  98 bpm    Heart Rate (Exit)  71 bpm    Oxygen Saturation (Admit)   95 %    Oxygen Saturation (Exercise)  95 %    Oxygen Saturation (Exit)  98 %    Rating of Perceived Exertion (Exercise)  12    Perceived Dyspnea (Exercise)  1    Symptoms  none    Duration  Continue with 45 min of aerobic exercise without signs/symptoms of physical distress.    Intensity  THRR unchanged      Progression   Progression  Continue to progress workloads to maintain intensity without signs/symptoms of physical distress.    Average METs  3.15      Resistance Training   Training Prescription  Yes    Weight  4 lb    Reps  10-15      Interval Training   Interval Training  No      Recumbant Bike   Level  6  Watts  40    Minutes  15    METs  3.3      T5 Nustep   Level  6    Minutes  15    METs  3.1       Functional Capacity: 6 Minute Walk    Row Name 11/30/17 1306 03/16/18 1432       6 Minute Walk   Phase  -  Discharge    Distance  1186 feet  1212 feet    Distance % Change  -  2.2 %    Distance Feet Change  -  26 ft    Walk Time  6 minutes  6 minutes    # of Rest Breaks  0  0    MPH  2.25  2.3    METS  2.09  2.37    RPE  13  12    Perceived Dyspnea   2  1    VO2 Peak  7.33  8.29    Symptoms  No  No    Resting HR  77 bpm  78 bpm    Resting BP  128/66  142/78    Resting Oxygen Saturation   92 %  97 %    Exercise Oxygen Saturation  during 6 min walk  92 %  88 %    Max Ex. HR  107 bpm  123 bpm    Max Ex. BP  178/82  146/74    2 Minute Post BP  164/84  144/64      Interval HR   1 Minute HR  -  104    2 Minute HR  88  123    3 Minute HR  93  116    4 Minute HR  96  113    5 Minute HR  105  114    6 Minute HR  107  112    2 Minute Post HR  85  78    Interval Heart Rate?  -  Yes      Interval Oxygen   Interval Oxygen?  -  Yes    Baseline Oxygen Saturation %  92 %  97 %    1 Minute Oxygen Saturation %  -  98 %    1 Minute Liters of Oxygen  0 L  0 L Room Air    2 Minute Oxygen Saturation %  93 %  93 %    2 Minute Liters of Oxygen  0 L  0 L     3 Minute Oxygen Saturation %  99 %  90 %    3 Minute Liters of Oxygen  0 L  0 L    4 Minute Oxygen Saturation %  93 %  88 %    4 Minute Liters of Oxygen  0 L  0 L    5 Minute Oxygen Saturation %  93 %  89 %    5 Minute Liters of Oxygen  -  0 L    6 Minute Oxygen Saturation %  92 %  89 %    6 Minute Liters of Oxygen  0 L  0 L    2 Minute Post Oxygen Saturation %  95 %  98 %    2 Minute Post Liters of Oxygen  0 L  0 L       Quality of Life:   Personal Goals: Goals established at orientation with interventions provided to work toward  goal. Personal Goals and Risk Factors at Admission - 11/30/17 1128      Core Components/Risk Factors/Patient Goals on Admission    Weight Management  Yes;Weight Loss    Intervention  Weight Management: Develop a combined nutrition and exercise program designed to reach desired caloric intake, while maintaining appropriate intake of nutrient and fiber, sodium and fats, and appropriate energy expenditure required for the weight goal.;Weight Management: Provide education and appropriate resources to help participant work on and attain dietary goals.;Weight Management/Obesity: Establish reasonable short term and long term weight goals.;Obesity: Provide education and appropriate resources to help participant work on and attain dietary goals.    Admit Weight  235 lb 12.8 oz (107 kg)    Goal Weight: Short Term  230 lb (104.3 kg)    Goal Weight: Long Term  200 lb (90.7 kg)    Expected Outcomes  Short Term: Continue to assess and modify interventions until short term weight is achieved;Weight Maintenance: Understanding of the daily nutrition guidelines, which includes 25-35% calories from fat, 7% or less cal from saturated fats, less than 223m cholesterol, less than 1.5gm of sodium, & 5 or more servings of fruits and vegetables daily;Long Term: Adherence to nutrition and physical activity/exercise program aimed toward attainment of established weight goal;Weight Loss:  Understanding of general recommendations for a balanced deficit meal plan, which promotes 1-2 lb weight loss per week and includes a negative energy balance of [phone] kcal/d;Understanding recommendations for meals to include 15-35% energy as protein, 25-35% energy from fat, 35-60% energy from carbohydrates, less than 2043mof dietary cholesterol, 20-35 gm of total fiber daily;Understanding of distribution of calorie intake throughout the day with the consumption of 4-5 meals/snacks    Improve shortness of breath with ADL's  Yes    Intervention  Provide education, individualized exercise plan and daily activity instruction to help decrease symptoms of SOB with activities of daily living.    Expected Outcomes  Short Term: Improve cardiorespiratory fitness to achieve a reduction of symptoms when performing ADLs;Long Term: Be able to perform more ADLs without symptoms or delay the onset of symptoms    Hypertension  Yes    Intervention  Provide education on lifestyle modifcations including regular physical activity/exercise, weight management, moderate sodium restriction and increased consumption of fresh fruit, vegetables, and low fat dairy, alcohol moderation, and smoking cessation.;Monitor prescription use compliance.    Expected Outcomes  Short Term: Continued assessment and intervention until BP is < 140/9051mG in hypertensive participants. < 130/37m50m in hypertensive participants with diabetes, heart failure or chronic kidney disease.;Long Term: Maintenance of blood pressure at goal levels.    Lipids  Yes    Intervention  Provide education and support for participant on nutrition & aerobic/resistive exercise along with prescribed medications to achieve LDL <70mg8mL >40mg.62mExpected Outcomes  Short Term: Participant states understanding of desired cholesterol values and is compliant with medications prescribed. Participant is following exercise prescription and nutrition guidelines.;Long Term:  Cholesterol controlled with medications as prescribed, with individualized exercise RX and with personalized nutrition plan. Value goals: LDL < 70mg, 102m> 40 mg.        Personal Goals Discharge: Goals and Risk Factor Review - 03/09/18 1041      Core Components/Risk Factors/Patient Goals Review   Personal Goals Review  Weight Management/Obesity;Improve shortness of breath with ADL's;Lipids;Hypertension    Review  Bud's weight is down to 214lbs and he continues to lose.  His blood pressures have been in  class and he does check them at home.  He continues to get short of breath but it has gotten better.  He would like to continue as encouraged by his surgeon.  We will see if we can get him back with a different diagnosis to be able to continue in rehab.    Expected Outcomes  Short: Continue to work on weight loss.  Long; Continue to try to improve his breathing.        Exercise Goals and Review: Exercise Goals    Row Name 11/30/17 1305             Exercise Goals   Increase Physical Activity  Yes       Intervention  Provide advice, education, support and counseling about physical activity/exercise needs.;Develop an individualized exercise prescription for aerobic and resistive training based on initial evaluation findings, risk stratification, comorbidities and participant's personal goals.       Expected Outcomes  Short Term: Attend rehab on a regular basis to increase amount of physical activity.;Long Term: Add in home exercise to make exercise part of routine and to increase amount of physical activity.;Long Term: Exercising regularly at least 3-5 days a week.       Increase Strength and Stamina  Yes       Intervention  Provide advice, education, support and counseling about physical activity/exercise needs.;Develop an individualized exercise prescription for aerobic and resistive training based on initial evaluation findings, risk stratification, comorbidities and participant's personal  goals.       Expected Outcomes  Short Term: Increase workloads from initial exercise prescription for resistance, speed, and METs.;Short Term: Perform resistance training exercises routinely during rehab and add in resistance training at home;Long Term: Improve cardiorespiratory fitness, muscular endurance and strength as measured by increased METs and functional capacity (6MWT)       Able to understand and use rate of perceived exertion (RPE) scale  Yes       Intervention  Provide education and explanation on how to use RPE scale       Expected Outcomes  Short Term: Able to use RPE daily in rehab to express subjective intensity level;Long Term:  Able to use RPE to guide intensity level when exercising independently       Able to understand and use Dyspnea scale  Yes       Intervention  Provide education and explanation on how to use Dyspnea scale       Expected Outcomes  Short Term: Able to use Dyspnea scale daily in rehab to express subjective sense of shortness of breath during exertion;Long Term: Able to use Dyspnea scale to guide intensity level when exercising independently       Knowledge and understanding of Target Heart Rate Range (THRR)  Yes       Intervention  Provide education and explanation of THRR including how the numbers were predicted and where they are located for reference       Expected Outcomes  Short Term: Able to state/look up THRR;Long Term: Able to use THRR to govern intensity when exercising independently;Short Term: Able to use daily as guideline for intensity in rehab       Able to check pulse independently  Yes       Intervention  Provide education and demonstration on how to check pulse in carotid and radial arteries.;Review the importance of being able to check your own pulse for safety during independent exercise       Expected Outcomes  Short Term: Able to explain why pulse checking is important during independent exercise;Long Term: Able to check pulse independently  and accurately       Understanding of Exercise Prescription  Yes       Intervention  Provide education, explanation, and written materials on patient's individual exercise prescription       Expected Outcomes  Short Term: Able to explain program exercise prescription;Long Term: Able to explain home exercise prescription to exercise independently          Nutrition & Weight - Outcomes: Pre Biometrics - 11/30/17 1304      Pre Biometrics   Height  5' 10.25" (1.784 m)    Weight  235 lb 12.8 oz (107 kg)    Waist Circumference  45 inches    Hip Circumference  45.5 inches    Waist to Hip Ratio  0.99 %    BMI (Calculated)  33.61      Post Biometrics - 03/16/18 1434       Post  Biometrics   Height  5' 10.25" (1.784 m)    Weight  213 lb 14.4 oz (97 kg)    Waist Circumference  45.75 inches    Hip Circumference  39.5 inches    Waist to Hip Ratio  1.16 %    BMI (Calculated)  30.49       Nutrition: Nutrition Therapy & Goals - 12/22/17 1036      Nutrition Therapy   Diet  TLC    Protein (specify units)  9oz    Fiber  37 grams    Saturated Fats  16 max. grams    Fruits and Vegetables  4 servings/day    Sodium  2000 grams      Personal Nutrition Goals   Nutrition Goal  Choose more "wholesome" snacks to help decrease hunger throughout the day, for example add a fruit / protein source    Personal Goal #2  Include sources of fiber and protein at each meal    Personal Goal #3  Increase intake of vegetables each week      Intervention Plan   Intervention  Prescribe, educate and counsel regarding individualized specific dietary modifications aiming towards targeted core components such as weight, hypertension, lipid management, diabetes, heart failure and other comorbidities.    Expected Outcomes  Short Term Goal: Understand basic principles of dietary content, such as calories, fat, sodium, cholesterol and nutrients.;Short Term Goal: A plan has been developed with personal nutrition goals  set during dietitian appointment.;Long Term Goal: Adherence to prescribed nutrition plan.       Nutrition Discharge: Nutrition Assessments - 03/09/18 1308      MEDFICTS Scores   Pre Score  60    Post Score  40    Score Difference  -20       Education Questionnaire Score: Knowledge Questionnaire Score - 03/09/18 1305      Knowledge Questionnaire Score   Pre Score  16/18    Post Score  15/18 results reviewed with pt today       Goals reviewed with patient; copy given to patient.

## 2018-03-23 DIAGNOSIS — J449 Chronic obstructive pulmonary disease, unspecified: Secondary | ICD-10-CM

## 2018-03-23 NOTE — Progress Notes (Signed)
Daily Session Note  Patient Details  Name: Jerry Jimenez MRN: [id number] Date of Birth: 07-Jan-1934 Referring Provider:     Pulmonary Rehab from 11/30/2017 in [facility]  Referring Provider  Sabra Heck      Encounter Date: 03/23/2018  Check In: Session Check In - 03/23/18 1119      Check-In   Location  [facility]    Staff Present  Justin Mend Lorre Nick, MA, RCEP, CCRP, Exercise Physiologist;Amanda Oletta Darter, [place], ACSM CEP, Exercise Physiologist    Supervising physician immediately available to respond to emergencies  [facility] immediately available ER MD    Physician(s)  Dr. Joni Fears and Quentin Cornwall    Medication changes reported      No    Fall or balance concerns reported     No    Tobacco Cessation  No Change    Warm-up and Cool-down  Performed as group-led instruction    Resistance Training Performed  Yes    VAD Patient?  No      Pain Assessment   Currently in Pain?  No/denies          Social History   Tobacco Use  Smoking Status Former Smoker  . Packs/day: 3.50  . Years: 35.00  . Pack years: 122.50  . Types: Cigarettes  . Last attempt to quit: 07/06/1987  . Years since quitting: 30.7  Smokeless Tobacco Former Systems developer  . Types: Chew  . Quit date: 07/05/2002    Goals Met:  Proper associated with RPD/PD & O2 Sat Independence with exercise equipment Using PLB without cueing & demonstrates good technique Exercise tolerated well No report of cardiac concerns or symptoms Strength training completed today  Goals Unmet:  Not Applicable  Comments:  Jerry Jimenez graduated today from  rehab with 36 sessions completed.  Details of the patient's exercise prescription and what He needs to do in order to continue the prescription and progress were discussed with patient.  Patient was given a copy of prescription and goals.  Patient verbalized understanding.  Jerry Jimenez plans to continue to exercise by using his treadmill at  home.   Dr. Emily Filbert is Medical Director for [facility] [place] and [facility].

## 2018-03-23 NOTE — Progress Notes (Signed)
Discharge Progress Report  Patient Details  Name: Jerry Jimenez MRN: [id number] Date of Birth: 11-21-1933 Referring Provider:     Pulmonary Rehab from 11/30/2017 in [facility]  Referring Provider  Sabra Heck       Number of Visits: 36/36  Reason for Discharge:  Patient reached a stable level of exercise. Patient independent in their exercise. Patient has met program and personal goals.  Smoking History:  Social History   Tobacco Use  Smoking Status Former Smoker  . Packs/day: 3.50  . Years: 35.00  . Pack years: 122.50  . Types: Cigarettes  . Last attempt to quit: 07/06/1987  . Years since quitting: 30.7  Smokeless Tobacco Former Systems developer  . Types: Chew  . Quit date: 07/05/2002    Diagnosis:  Chronic obstructive pulmonary disease, unspecified COPD type ([place])  ADL [facility]: Pulmonary Assessment Scores    Row Name 11/30/17 1121 01/05/18 1017 03/09/18 1306     ADL [facility]   ADL Phase  Entry  Mid  Exit   SOB Score total  51  22  72   Rest  2  0  4   Walk  _0 Stairs  _1 Bath  0  1  2   Dress  _2 Shop  _3 CAT Score   CAT Score  17  -  19     mMRC Score   mMRC Score  2  -  -   Row Name 03/11/18 1039 03/16/18 1435       ADL [facility]   ADL Phase  Exit retaken with pt one on one  Exit    SOB Score total  61 Has not gotten back to where he was prior to surgery   -    Rest  3 occasionally gets sudden onset of SOB while sitting  -    Walk  2  -    Stairs  4  -    Bath  1  -    Dress  2  -    Shop  3  -      mMRC Score   mMRC Score  -  1       Initial Exercise Prescription: Initial Exercise Prescription - 11/30/17 1300      Date of Initial Exercise RX and Referring Provider   Date  11/30/17    Referring Provider  Sabra Heck      Treadmill   MPH  2    Grade  0    Minutes  15    METs  2.53      Recumbant Bike   Level  2    RPM  60    Watts  15    Minutes  15    METs  2.4      NuStep   Level  2    SPM  80     Minutes  15    METs  2.4      T5 Nustep   Level  1    SPM  80    Minutes  15    METs  2.4      Prescription Details   Frequency (times per week)  3    Duration  Progress to 45 minutes of aerobic exercise without signs/symptoms of physical distress      Intensity  THRR 40-80% of Max Heartrate  102-126    Ratings of Perceived Exertion  11-15    Perceived Dyspnea  0-4      Resistance Training   Training Prescription  Yes    Weight  3 lb    Reps  10-15       Discharge Exercise Prescription (Final Exercise Prescription Changes): Exercise Prescription Changes - 03/15/18 1300      Response to Exercise   Blood Pressure (Admit)  138/70    Blood Pressure (Exit)  120/82    Heart Rate (Admit)  93 bpm    Heart Rate (Exercise)  98 bpm    Heart Rate (Exit)  71 bpm    Oxygen Saturation (Admit)  95 %    Oxygen Saturation (Exercise)  95 %    Oxygen Saturation (Exit)  98 %    Rating of Perceived Exertion (Exercise)  12    Perceived Dyspnea (Exercise)  1    Symptoms  none    Duration  Continue with 45 min of aerobic exercise without signs/symptoms of physical distress.    Intensity  THRR unchanged      Progression   Progression  Continue to progress workloads to maintain intensity without signs/symptoms of physical distress.    Average METs  3.15      Resistance Training   Training Prescription  Yes    Weight  4 lb    Reps  10-15      Interval Training   Interval Training  No      Recumbant Bike   Level  6    Watts  40    Minutes  15    METs  3.3      T5 Nustep   Level  6    Minutes  15    METs  3.1       Functional Capacity: 6 Minute Walk    Row Name 11/30/17 1306 03/16/18 1432       6 Minute Walk   Phase  -  Discharge    Distance  1186 feet  1212 feet    Distance % Change  -  2.2 %    Distance Feet Change  -  26 ft    Walk Time  6 minutes  6 minutes    # of Rest Breaks  0  0    MPH  2.25  2.3    METS  2.09  2.37    RPE  13  12    Perceived Dyspnea   2   1    VO2 Peak  7.33  8.29    Symptoms  No  No    Resting HR  77 bpm  78 bpm    Resting BP  128/66  142/78    Resting Oxygen Saturation   92 %  97 %    Exercise Oxygen Saturation  during 6 min walk  92 %  88 %    Max Ex. HR  107 bpm  123 bpm    Max Ex. BP  178/82  146/74    2 Minute Post BP  164/84  144/64      Interval HR   1 Minute HR  -  104    2 Minute HR  88  123    3 Minute HR  93  116    4 Minute HR  96  113    5 Minute HR  105  114    6  Minute HR  107  112    2 Minute Post HR  85  78    Interval Heart Rate?  -  Yes      Interval Oxygen   Interval Oxygen?  -  Yes    Baseline Oxygen Saturation %  92 %  97 %    1 Minute Oxygen Saturation %  -  98 %    1 Minute Liters of Oxygen  0 L  0 L Room Air    2 Minute Oxygen Saturation %  93 %  93 %    2 Minute Liters of Oxygen  0 L  0 L    3 Minute Oxygen Saturation %  99 %  90 %    3 Minute Liters of Oxygen  0 L  0 L    4 Minute Oxygen Saturation %  93 %  88 %    4 Minute Liters of Oxygen  0 L  0 L    5 Minute Oxygen Saturation %  93 %  89 %    5 Minute Liters of Oxygen  -  0 L    6 Minute Oxygen Saturation %  92 %  89 %    6 Minute Liters of Oxygen  0 L  0 L    2 Minute Post Oxygen Saturation %  95 %  98 %    2 Minute Post Liters of Oxygen  0 L  0 L       Psychological, QOL, Others - Outcomes: PHQ 2/9: Depression screen [facility] 2/9 03/16/2018 01/05/2018 11/30/2017 07/20/2017  Decreased Interest 3 0 2 1  Down, Depressed, Hopeless 0 0 0 0  PHQ - 2 Score 3 0 2 1  Altered sleeping _0 Tired, decreased energy _1 Change in appetite 0 2 0 3  Feeling bad or failure about yourself  0 0 0 0  Trouble concentrating 0 0 0 0  Moving slowly or fidgety/restless 0 0 0 0  Suicidal thoughts 0 0 0 0  PHQ-9 Score _2 Difficult doing work/chores Somewhat difficult Somewhat difficult Somewhat difficult Not difficult at all    Quality of Life:   Personal Goals: Goals established at orientation with interventions provided to  work toward goal. Personal Goals and Risk Factors at Admission - 11/30/17 1128      Core Components/Risk Factors/Patient Goals on Admission    Weight Management  Yes;Weight Loss    Intervention  Weight Management: Develop a combined nutrition and exercise program designed to reach desired caloric intake, while maintaining appropriate intake of nutrient and fiber, sodium and fats, and appropriate energy expenditure required for the weight goal.;Weight Management: Provide education and appropriate resources to help participant work on and attain dietary goals.;Weight Management/Obesity: Establish reasonable short term and long term weight goals.;Obesity: Provide education and appropriate resources to help participant work on and attain dietary goals.    Admit Weight  235 lb 12.8 oz (107 kg)    Goal Weight: Short Term  230 lb (104.3 kg)    Goal Weight: Long Term  200 lb (90.7 kg)    Expected Outcomes  Short Term: Continue to assess and modify interventions until short term weight is achieved;Weight Maintenance: Understanding of the daily nutrition guidelines, which includes 25-35% calories from fat, 7% or less cal from saturated fats, less than 26m cholesterol, less than 1.5gm of sodium, & 5 or more servings of fruits and  vegetables daily;Long Term: Adherence to nutrition and physical activity/exercise program aimed toward attainment of established weight goal;Weight Loss: Understanding of general recommendations for a balanced deficit meal plan, which promotes 1-2 lb weight loss per week and includes a negative energy balance of [phone] kcal/d;Understanding recommendations for meals to include 15-35% energy as protein, 25-35% energy from fat, 35-60% energy from carbohydrates, less than 21m of dietary cholesterol, 20-35 gm of total fiber daily;Understanding of distribution of calorie intake throughout the day with the consumption of 4-5 meals/snacks    Improve shortness of breath with ADL's  Yes     Intervention  Provide education, individualized exercise plan and daily activity instruction to help decrease symptoms of SOB with activities of daily living.    Expected Outcomes  Short Term: Improve cardiorespiratory fitness to achieve a reduction of symptoms when performing ADLs;Long Term: Be able to perform more ADLs without symptoms or delay the onset of symptoms    Hypertension  Yes    Intervention  Provide education on lifestyle modifcations including regular physical activity/exercise, weight management, moderate sodium restriction and increased consumption of fresh fruit, vegetables, and low fat dairy, alcohol moderation, and smoking cessation.;Monitor prescription use compliance.    Expected Outcomes  Short Term: Continued assessment and intervention until BP is < 140/932mHG in hypertensive participants. < 130/803mG in hypertensive participants with diabetes, heart failure or chronic kidney disease.;Long Term: Maintenance of blood pressure at goal levels.    Lipids  Yes    Intervention  Provide education and support for participant on nutrition & aerobic/resistive exercise along with prescribed medications to achieve LDL <13m2mDL >40mg24m Expected Outcomes  Short Term: Participant states understanding of desired cholesterol values and is compliant with medications prescribed. Participant is following exercise prescription and nutrition guidelines.;Long Term: Cholesterol controlled with medications as prescribed, with individualized exercise RX and with personalized nutrition plan. Value goals: LDL < 13mg,93m > 40 mg.        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 His weight has gone down 5 pounds. He wants to continue with his weight loss. Zollie blood pressure is getting better since he started the program.  Bud's weight has improved since he started the program. His blood pressure has also seen an improvement. He has been doing well in the program but his shortness of breath took a hit since his surgery.  Bud's weight is down to 214lbs and he continues to lose.  His blood pressures have been in class and he does check them at home.  He continues to get short of breath but it has gotten better.  He would like to continue as encouraged by his surgeon.  We will see if we can get him back with a different diagnosis to be able to continue in rehab.     Expected Outcomes  Short: continue [facility] to lose more weight. Long: maintain an exercise routine to continue weight loss  Short: continue to exercise to regain strength. Long: maintain exericse to improve ADL.  Short: Continue to work on [employer]  loss.  Long; Continue to try to improve his breathing.         Exercise Goals and Review: Exercise Goals    Row Name 11/30/17 1305             Exercise Goals   Increase Physical Activity  Yes       Intervention  Provide advice, education, support and counseling about physical activity/exercise needs.;Develop an individualized exercise prescription for aerobic and resistive training based on initial evaluation findings, risk stratification, comorbidities and participant's personal goals.       Expected Outcomes  Short Term: Attend rehab on a regular basis to increase amount of physical activity.;Long Term: Add in home exercise to make exercise part of routine and to increase amount of physical activity.;Long Term: Exercising regularly at least 3-5 days a week.       Increase Strength and Stamina  Yes        Intervention  Provide advice, education, support and counseling about physical activity/exercise needs.;Develop an individualized exercise prescription for aerobic and resistive training based on initial evaluation findings, risk stratification, comorbidities and participant's personal goals.       Expected Outcomes  Short Term: Increase workloads from initial exercise prescription for resistance, speed, and METs.;Short Term: Perform resistance training exercises routinely during rehab and add in resistance training at home;Long Term: Improve cardiorespiratory fitness, muscular endurance and strength as measured by increased METs and functional capacity (6MWT)       Able to understand and use rate of perceived exertion (RPE) scale  Yes       Intervention  Provide education and explanation on how to use RPE scale       Expected Outcomes  Short Term: Able to use RPE daily in rehab to express subjective intensity level;Long Term:  Able to use RPE to guide intensity level when exercising independently       Able to understand and use Dyspnea scale  Yes       Intervention  Provide education and explanation on how to use Dyspnea scale       Expected Outcomes  Short Term: Able to use Dyspnea scale daily in rehab to express subjective sense of shortness of breath during exertion;Long Term: Able to use Dyspnea scale to guide intensity level when exercising independently       Knowledge and understanding of Target Heart Rate Range (THRR)  Yes       Intervention  Provide education and explanation of THRR including how the numbers were predicted and where they are located for reference       Expected Outcomes  Short Term: Able to state/look up THRR;Long Term: Able to use THRR to govern intensity when exercising independently;Short Term: Able to use daily as guideline for intensity in rehab       Able to check pulse independently  Yes       Intervention  Provide education and demonstration on how to check pulse in  carotid and radial arteries.;Review the importance of being able to check your own pulse for safety during independent exercise       Expected Outcomes  Short Term: Able to explain why pulse checking is important during independent exercise;Long Term: Able to check pulse independently and accurately       Understanding of Exercise Prescription  Yes       Intervention  Provide education, explanation, and written materials on patient's individual exercise prescription       Expected Outcomes  Short Term:  Able to explain program exercise prescription;Long Term: Able to explain home exercise prescription to exercise independently          Nutrition & Weight - Outcomes: Pre Biometrics - 11/30/17 1304      Pre Biometrics   Height  5' 10.25" (1.784 m)    Weight  235 lb 12.8 oz (107 kg)    Waist Circumference  45 inches    Hip Circumference  45.5 inches    Waist to Hip Ratio  0.99 %    BMI (Calculated)  33.61      Post Biometrics - 03/16/18 1434       Post  Biometrics   Height  5' 10.25" (1.784 m)    Weight  213 lb 14.4 oz (97 kg)    Waist Circumference  45.75 inches    Hip Circumference  39.5 inches    Waist to Hip Ratio  1.16 %    BMI (Calculated)  30.49       Nutrition: Nutrition Therapy & Goals - 12/22/17 1036      Nutrition Therapy   Diet  TLC    Protein (specify units)  9oz    Fiber  37 grams    Saturated Fats  16 max. grams    Fruits and Vegetables  4 servings/day    Sodium  2000 grams      Personal Nutrition Goals   Nutrition Goal  Choose more "wholesome" snacks to help decrease hunger throughout the day, for example add a fruit / protein source    Personal Goal #2  Include sources of fiber and protein at each meal    Personal Goal #3  Increase intake of vegetables each week      Intervention Plan   Intervention  Prescribe, educate and counsel regarding individualized specific dietary modifications aiming towards targeted core components such as weight, hypertension,  lipid management, diabetes, heart failure and other comorbidities.    Expected Outcomes  Short Term Goal: Understand basic principles of dietary content, such as calories, fat, sodium, cholesterol and nutrients.;Short Term Goal: A plan has been developed with personal nutrition goals set during dietitian appointment.;Long Term Goal: Adherence to prescribed nutrition plan.       Nutrition Discharge: Nutrition Assessments - 03/09/18 1308      MEDFICTS Scores   Pre Score  60    Post Score  40    Score Difference  -20       Education Questionnaire Score: Knowledge Questionnaire Score - 03/09/18 1305      Knowledge Questionnaire Score   Pre Score  16/18    Post Score  15/18 results reviewed with pt today       Goals reviewed with patient; copy given to patient.

## 2018-03-23 NOTE — Progress Notes (Signed)
Pulmonary Individual Treatment Plan  Patient Details  Name: Jerry Jimenez MRN: [id number] Date of Birth: [birth year] Referring Provider:     Pulmonary Rehab from 11/30/2017 in [facility] Cardiac and Pulmonary Rehab  Referring Provider  Sabra Heck      Initial Encounter Date:    Pulmonary Rehab from 11/30/2017 in [facility] Cardiac and Pulmonary Rehab  Date  11/30/17  Referring Provider  Sabra Heck      Visit Diagnosis: Chronic obstructive pulmonary disease, unspecified COPD type ([place])  Patient's Home Medications on Admission:  Current Outpatient Medications:  .  aspirin EC 81 MG tablet, Take 81 mg by mouth daily., Disp: , Rfl:  .  cephALEXin (KEFLEX) 500 MG capsule, Take 1 capsule (500 mg total) by mouth every 12 (twelve) hours. (Patient not taking: Reported on 08/28/2017), Disp: 10 capsule, Rfl: 0 .  cholecalciferol (VITAMIN D) 1000 units tablet, Take 1,000 Units by mouth daily., Disp: , Rfl:  .  cyanocobalamin 500 MCG tablet, Take 500 mcg by mouth daily., Disp: , Rfl:  .  doxycycline (VIBRAMYCIN) 100 MG capsule, Take 100 mg by mouth 2 (two) times daily., Disp: , Rfl:  .  ELIQUIS 5 MG TABS tablet, Take 1 tablet (5 mg total) by mouth 2 (two) times daily. Patient advised to discuss with primary care physician before starting Eliquis.  It has been on hold for 4 weeks, Disp: 60 tablet, Rfl: 2 .  furosemide (LASIX) 40 MG tablet, Take 40 mg by mouth daily., Disp: , Rfl:  .  gabapentin (NEURONTIN) 100 MG capsule, Take 100 mg by mouth at bedtime., Disp: , Rfl:  .  glimepiride (AMARYL) 2 MG tablet, Take 1 tablet by mouth daily., Disp: , Rfl: 10 .  ipratropium-albuterol (DUONEB) 0.5-2.5 (3) MG/3ML SOLN, Take 3 mLs by nebulization every 6 (six) hours as needed., Disp: 360 mL, Rfl: 1 .  LORazepam (ATIVAN) 0.5 MG tablet, Take 1 tablet (0.5 mg total) every 8 (eight) hours as needed by mouth for anxiety., Disp: 15 tablet, Rfl: 0 .  magnesium oxide (MAG-OX) 400 MG tablet, Take 400 mg by mouth daily., Disp: , Rfl:  .   Melatonin 5 MG TABS, Take 1 tablet by mouth at bedtime., Disp: , Rfl:  .  montelukast (SINGULAIR) 10 MG tablet, Take 1 tablet by mouth every evening. , Disp: , Rfl: 2 .  multivitamin-lutein (OCUVITE-LUTEIN) CAPS capsule, Take 1 capsule by mouth daily., Disp: , Rfl:  .  omeprazole (PRILOSEC) 20 MG capsule, Take 1 capsule by mouth daily., Disp: , Rfl: 0 .  pramipexole (MIRAPEX) 0.5 MG tablet, Take 1 tablet by mouth 2 (two) times daily., Disp: , Rfl: 0 .  predniSONE (DELTASONE) 10 MG tablet, Take 50 mg daily.  Taper by 10 mg daily then stop. (Patient not taking: Reported on 08/28/2017), Disp: 15 tablet, Rfl: 0 .  PROAIR HFA 108 (90 Base) MCG/ACT inhaler, Inhale 2 puffs into the lungs every 6 (six) hours as needed., Disp: , Rfl: 11 .  simvastatin (ZOCOR) 20 MG tablet, Take 1 tablet by mouth daily at 6 PM. , Disp: , Rfl: 2 .  STIOLTO RESPIMAT 2.5-2.5 MCG/ACT AERS, Inhale 2 puffs into the lungs every morning. , Disp: , Rfl: 10 .  temazepam (RESTORIL) 30 MG capsule, Take 1 capsule by mouth daily., Disp: , Rfl: 0 .  traMADol (ULTRAM) 50 MG tablet, 100 mg every 6 (six) hours as needed. , Disp: , Rfl: 0  Past Medical History: Past Medical History:  Diagnosis Date  . CAD (coronary artery  disease)   . CHF (congestive heart failure) ([place])   . COPD (chronic obstructive pulmonary disease) ([place])   . Hypertension     Tobacco Use: Social History   Tobacco Use  Smoking Status Former Smoker  . Packs/day: 3.50  . Years: 35.00  . Pack years: 122.50  . Types: Cigarettes  . Last attempt to quit: 07/06/1987  . Years since quitting: 30.7  Smokeless Tobacco Former Systems developer  . Types: Chew  . Quit date: 07/05/2002    Labs: Recent Review Flowsheet Data    Labs for ITP Cardiac and Pulmonary Rehab Latest Ref Rng & Units 09/21/2017   PHART 7.350 - 7.450 7.44   PCO2ART 32.0 - 48.0 mmHg 41   HCO3 20.0 - 28.0 mmol/L 27.8   O2SAT % 94.0       Pulmonary Assessment Scores: Pulmonary Assessment Scores    Row Name  11/30/17 1121 01/05/18 1017 03/09/18 1306     ADL UCSD   ADL Phase  Entry  Mid  Exit   SOB Score total  51  22  72   Rest  2  0  4   Walk  '3  1  2   '$ Stairs  '4  2  4   '$ Bath  0  1  2   Dress  '3  1  3   '$ Shop  '2  1  3     '$ CAT Score   CAT Score  17  -  19     mMRC Score   mMRC Score  2  -  -   Row Name 03/11/18 1039 03/16/18 1435       ADL UCSD   ADL Phase  Exit retaken with pt one on one  Exit    SOB Score total  61 Has not gotten back to where he was prior to surgery   -    Rest  3 occasionally gets sudden onset of SOB while sitting  -    Walk  2  -    Stairs  4  -    Bath  1  -    Dress  2  -    Shop  3  -      mMRC Score   mMRC Score  -  1       Pulmonary Function Assessment: Pulmonary Function Assessment - 11/30/17 1124      Pulmonary Function Tests   FVC%  92 % test done on 09/21/17    FEV1%  70 %    FEV1/FVC Ratio  59      Breath   Bilateral Breath Sounds  Clear    Shortness of Breath  Yes;Limiting activity       Exercise Target Goals:    Exercise Program Goal: Individual exercise prescription set using results from initial 6 min walk test and THRR while considering  patient's activity barriers and safety.    Exercise Prescription Goal: Initial exercise prescription builds to 30-45 minutes a day of aerobic activity, 2-3 days per week.  Home exercise guidelines will be given to patient during program as part of exercise prescription that the participant will acknowledge.  Activity Barriers & Risk Stratification:   6 Minute Walk: 6 Minute Walk    Row Name 11/30/17 1306 03/16/18 1432       6 Minute Walk   Phase  -  Discharge    Distance  1186 feet  1212 feet    Distance % Change  -  2.2 %    Distance Feet Change  -  26 ft    Walk Time  6 minutes  6 minutes    # of Rest Breaks  0  0    MPH  2.25  2.3    METS  2.09  2.37    RPE  13  12    Perceived Dyspnea   2  1    VO2 Peak  7.33  8.29    Symptoms  No  No    Resting HR  77 bpm  78 bpm     Resting BP  128/66  142/78    Resting Oxygen Saturation   92 %  97 %    Exercise Oxygen Saturation  during 6 min walk  92 %  88 %    Max Ex. HR  107 bpm  123 bpm    Max Ex. BP  178/82  146/74    2 Minute Post BP  164/84  144/64      Interval HR   1 Minute HR  -  104    2 Minute HR  88  123    3 Minute HR  93  116    4 Minute HR  96  113    5 Minute HR  105  114    6 Minute HR  107  112    2 Minute Post HR  85  78    Interval Heart Rate?  -  Yes      Interval Oxygen   Interval Oxygen?  -  Yes    Baseline Oxygen Saturation %  92 %  97 %    1 Minute Oxygen Saturation %  -  98 %    1 Minute Liters of Oxygen  0 L  0 L Room Air    2 Minute Oxygen Saturation %  93 %  93 %    2 Minute Liters of Oxygen  0 L  0 L    3 Minute Oxygen Saturation %  99 %  90 %    3 Minute Liters of Oxygen  0 L  0 L    4 Minute Oxygen Saturation %  93 %  88 %    4 Minute Liters of Oxygen  0 L  0 L    5 Minute Oxygen Saturation %  93 %  89 %    5 Minute Liters of Oxygen  -  0 L    6 Minute Oxygen Saturation %  92 %  89 %    6 Minute Liters of Oxygen  0 L  0 L    2 Minute Post Oxygen Saturation %  95 %  98 %    2 Minute Post Liters of Oxygen  0 L  0 L      Oxygen Initial Assessment: Oxygen Initial Assessment - 11/30/17 1128      Home Oxygen   Home Oxygen Device  None    Sleep Oxygen Prescription  None    Home Exercise Oxygen Prescription  None    Home at Rest Exercise Oxygen Prescription  None      Initial 6 min Walk   Oxygen Used  None      Program Oxygen Prescription   Program Oxygen Prescription  None      Intervention   Short Term Goals  To learn and understand importance of maintaining oxygen saturations>88%;To learn and demonstrate proper use of respiratory medications;To learn and  demonstrate proper pursed lip breathing techniques or other breathing techniques.;To learn and understand importance of monitoring SPO2 with pulse oximeter and demonstrate accurate use of the pulse oximeter.     Long  Term Goals  Verbalizes importance of monitoring SPO2 with pulse oximeter and return demonstration;Exhibits proper breathing techniques, such as pursed lip breathing or other method taught during program session;Demonstrates proper use of MDI's;Compliance with respiratory medication;Maintenance of O2 saturations>88%       Oxygen Re-Evaluation: Oxygen Re-Evaluation    Row Name 12/10/17 1014 02/14/18 1023 03/09/18 1049         Program Oxygen Prescription   Program Oxygen Prescription  -  None  None       Home Oxygen   Home Oxygen Device  -  None  None     Sleep Oxygen Prescription  -  None  None     Home Exercise Oxygen Prescription  -  None  None     Home at Rest Exercise Oxygen Prescription  -  None  None       Goals/Expected Outcomes   Short Term Goals  To learn and demonstrate proper pursed lip breathing techniques or other breathing techniques.;To learn and understand importance of monitoring SPO2 with pulse oximeter and demonstrate accurate use of the pulse oximeter.;To learn and exhibit compliance with exercise, home and travel O2 prescription;To learn and understand importance of maintaining oxygen saturations>88%  To learn and demonstrate proper pursed lip breathing techniques or other breathing techniques.;To learn and understand importance of monitoring SPO2 with pulse oximeter and demonstrate accurate use of the pulse oximeter.;To learn and exhibit compliance with exercise, home and travel O2 prescription;To learn and understand importance of maintaining oxygen saturations>88%  To learn and demonstrate proper pursed lip breathing techniques or other breathing techniques.;To learn and understand importance of monitoring SPO2 with pulse oximeter and demonstrate accurate use of the pulse oximeter.;To learn and exhibit compliance with exercise, home and travel O2 prescription;To learn and understand importance of maintaining oxygen saturations>88%     Long  Term Goals  Maintenance  of O2 saturations>88%;Exhibits proper breathing techniques, such as pursed lip breathing or other method taught during program session;Verbalizes importance of monitoring SPO2 with pulse oximeter and return demonstration  Maintenance of O2 saturations>88%;Exhibits proper breathing techniques, such as pursed lip breathing or other method taught during program session;Verbalizes importance of monitoring SPO2 with pulse oximeter and return demonstration  Maintenance of O2 saturations>88%;Exhibits proper breathing techniques, such as pursed lip breathing or other method taught during program session;Verbalizes importance of monitoring SPO2 with pulse oximeter and return demonstration;Compliance with respiratory medication     Comments  Reviewed PLB technique with pt.  Talked about how it work and it's important to maintaining his exercise saturations.    Bud states that his breathing has not been too good. He states he is not up to par since his surgery. He is still getting a little short of breath when doing daily activities.   He has been using his nebulizer on occasion when he gets short of breath.  He continues to monitor his oxygen saturations at home.  He is still getting short of breath but it is getting better.      Goals/Expected Outcomes  Short: Become more profiecient at using PLB.   Long: Become independent at using PLB.  Short: continue exercising to improve SOB. Long: maintain exercise independently to keep SOB at a minimum.  Short: Continue to use nebulizer and monitoring saturations.  Long: Continue to  manage pulmonary disease.         Oxygen Discharge (Final Oxygen Re-Evaluation): Oxygen Re-Evaluation - 03/09/18 1049      Program Oxygen Prescription   Program Oxygen Prescription  None      Home Oxygen   Home Oxygen Device  None    Sleep Oxygen Prescription  None    Home Exercise Oxygen Prescription  None    Home at Rest Exercise Oxygen Prescription  None      Goals/Expected Outcomes    Short Term Goals  To learn and demonstrate proper pursed lip breathing techniques or other breathing techniques.;To learn and understand importance of monitoring SPO2 with pulse oximeter and demonstrate accurate use of the pulse oximeter.;To learn and exhibit compliance with exercise, home and travel O2 prescription;To learn and understand importance of maintaining oxygen saturations>88%    Long  Term Goals  Maintenance of O2 saturations>88%;Exhibits proper breathing techniques, such as pursed lip breathing or other method taught during program session;Verbalizes importance of monitoring SPO2 with pulse oximeter and return demonstration;Compliance with respiratory medication    Comments  He has been using his nebulizer on occasion when he gets short of breath.  He continues to monitor his oxygen saturations at home.  He is still getting short of breath but it is getting better.     Goals/Expected Outcomes  Short: Continue to use nebulizer and monitoring saturations.  Long: Continue to manage pulmonary disease.        Initial Exercise Prescription: Initial Exercise Prescription - 11/30/17 1300      Date of Initial Exercise RX and Referring Provider   Date  11/30/17    Referring Provider  Sabra Heck      Treadmill   MPH  2    Grade  0    Minutes  15    METs  2.53      Recumbant Bike   Level  2    RPM  60    Watts  15    Minutes  15    METs  2.4      NuStep   Level  2    SPM  80    Minutes  15    METs  2.4      T5 Nustep   Level  1    SPM  80    Minutes  15    METs  2.4      Prescription Details   Frequency (times per week)  3    Duration  Progress to 45 minutes of aerobic exercise without signs/symptoms of physical distress      Intensity   THRR 40-80% of Max Heartrate  102-126    Ratings of Perceived Exertion  11-15    Perceived Dyspnea  0-4      Resistance Training   Training Prescription  Yes    Weight  3 lb    Reps  10-15       Perform Capillary Blood Glucose  checks as needed.  Exercise Prescription Changes: Exercise Prescription Changes    Row Name 01/05/18 1200 01/19/18 1500 02/17/18 1000 03/02/18 1400 03/15/18 1300     Response to Exercise   Blood Pressure (Admit)  144/78  142/68  146/80  138/62  138/70   Blood Pressure (Exit)  138/76  128/62  134/60  162/76  120/82   Heart Rate (Admit)  72 bpm  74 bpm  63 bpm  80 bpm  93 bpm   Heart Rate (Exercise)  95  bpm  83 bpm  103 bpm  89 bpm  98 bpm   Heart Rate (Exit)  78 bpm  65 bpm  82 bpm  95 bpm  71 bpm   Oxygen Saturation (Admit)  93 %  92 %  97 %  96 %  95 %   Oxygen Saturation (Exercise)  95 %  95 %  91 %  96 %  95 %   Oxygen Saturation (Exit)  94 %  91 %  93 %  96 %  98 %   Rating of Perceived Exertion (Exercise)  '12  12  12  12  12   '$ Perceived Dyspnea (Exercise)  '1  1  2  1  1   '$ Symptoms  none  none  none  none  none   Duration  Continue with 45 min of aerobic exercise without signs/symptoms of physical distress.  Continue with 45 min of aerobic exercise without signs/symptoms of physical distress.  Continue with 45 min of aerobic exercise without signs/symptoms of physical distress.  Continue with 45 min of aerobic exercise without signs/symptoms of physical distress.  Continue with 45 min of aerobic exercise without signs/symptoms of physical distress.   Intensity  THRR unchanged  THRR New THR low due to meds?  THRR unchanged  THRR unchanged  THRR unchanged     Progression   Progression  Continue to progress workloads to maintain intensity without signs/symptoms of physical distress.  Continue to progress workloads to maintain intensity without signs/symptoms of physical distress.  Continue to progress workloads to maintain intensity without signs/symptoms of physical distress.  Continue to progress workloads to maintain intensity without signs/symptoms of physical distress.  Continue to progress workloads to maintain intensity without signs/symptoms of physical distress.   Average METs  3   2.9  -  2.8  3.15     Resistance Training   Training Prescription  Yes  Yes  -  Yes  Yes   Weight  3 lb  4 lb  -  4 lb  4 lb   Reps  10-15  10-15  -  10-15  10-15     Interval Training   Interval Training  No  No  -  No  No     Treadmill   MPH  2.5  2.'5  2  2  '$ -   Grade  0.5  0.5  0.5  0  -   Minutes  '15  15  15  15  '$ -   METs  3.09  3.09  3.15  2.53  -     Recumbant Bike   Level  '3  6  6  6  6   '$ RPM  78  68  63  55  -   Watts  32  46  44  36  40   Minutes  '15  15  15  15  15   '$ METs  3  3.2  3.4  3.3  3.3     T5 Nustep   Level  -  6  -  -  6   Minutes  -  15  -  -  15   METs  -  2.3  -  -  3.1      Exercise Comments: Exercise Comments    Row Name 12/10/17 1012 12/22/17 1521 12/27/17 1024 03/23/18 1123     Exercise Comments  First full day of exercise!  Patient was oriented to gym  and equipment including functions, settings, policies, and procedures.  Patient's individual exercise prescription and treatment plan were reviewed.  All starting workloads were established based on the results of the 6 minute walk test done at initial orientation visit.  The plan for exercise progression was also introduced and progression will be customized based on patient's performance and goals.   Reviewed home exercise with pt today.  Pt plans to use TM at home for exercise.  Reviewed THR, pulse, RPE, sign and symptoms, NTG use, and when to call 911 or MD.  Also discussed weather considerations and indoor options.  Pt voiced understanding.   First full day of exercise!  Patient was oriented to gym and equipment including functions, settings, policies, and procedures.  Patient's individual exercise prescription and treatment plan were reviewed.  All starting workloads were established based on the results of the 6 minute walk test done at initial orientation visit.  The plan for exercise progression was also introduced and progression will be customized based on patient's performance and goals.  Alexios  graduated today from  rehab with 36 sessions completed.  Details of the patient's exercise prescription and what He needs to do in order to continue the prescription and progress were discussed with patient.  Patient was given a copy of prescription and goals.  Patient verbalized understanding.  Yacob plans to continue to exercise by using his treadmill at home.       Exercise Goals and Review: Exercise Goals    Row Name 11/30/17 1305             Exercise Goals   Increase Physical Activity  Yes       Intervention  Provide advice, education, support and counseling about physical activity/exercise needs.;Develop an individualized exercise prescription for aerobic and resistive training based on initial evaluation findings, risk stratification, comorbidities and participant's personal goals.       Expected Outcomes  Short Term: Attend rehab on a regular basis to increase amount of physical activity.;Long Term: Add in home exercise to make exercise part of routine and to increase amount of physical activity.;Long Term: Exercising regularly at least 3-5 days a week.       Increase Strength and Stamina  Yes       Intervention  Provide advice, education, support and counseling about physical activity/exercise needs.;Develop an individualized exercise prescription for aerobic and resistive training based on initial evaluation findings, risk stratification, comorbidities and participant's personal goals.       Expected Outcomes  Short Term: Increase workloads from initial exercise prescription for resistance, speed, and METs.;Short Term: Perform resistance training exercises routinely during rehab and add in resistance training at home;Long Term: Improve cardiorespiratory fitness, muscular endurance and strength as measured by increased METs and functional capacity (6MWT)       Able to understand and use rate of perceived exertion (RPE) scale  Yes       Intervention  Provide education and explanation on  how to use RPE scale       Expected Outcomes  Short Term: Able to use RPE daily in rehab to express subjective intensity level;Long Term:  Able to use RPE to guide intensity level when exercising independently       Able to understand and use Dyspnea scale  Yes       Intervention  Provide education and explanation on how to use Dyspnea scale       Expected Outcomes  Short Term: Able to use Dyspnea scale daily  in rehab to express subjective sense of shortness of breath during exertion;Long Term: Able to use Dyspnea scale to guide intensity level when exercising independently       Knowledge and understanding of Target Heart Rate Range (THRR)  Yes       Intervention  Provide education and explanation of THRR including how the numbers were predicted and where they are located for reference       Expected Outcomes  Short Term: Able to state/look up THRR;Long Term: Able to use THRR to govern intensity when exercising independently;Short Term: Able to use daily as guideline for intensity in rehab       Able to check pulse independently  Yes       Intervention  Provide education and demonstration on how to check pulse in carotid and radial arteries.;Review the importance of being able to check your own pulse for safety during independent exercise       Expected Outcomes  Short Term: Able to explain why pulse checking is important during independent exercise;Long Term: Able to check pulse independently and accurately       Understanding of Exercise Prescription  Yes       Intervention  Provide education, explanation, and written materials on patient's individual exercise prescription       Expected Outcomes  Short Term: Able to explain program exercise prescription;Long Term: Able to explain home exercise prescription to exercise independently          Exercise Goals Re-Evaluation : Exercise Goals Re-Evaluation    Row Name 12/10/17 1013 12/22/17 1522 01/05/18 1249 01/19/18 1507 02/14/18 1107      Exercise Goal Re-Evaluation   Exercise Goals Review  Able to understand and use rate of perceived exertion (RPE) scale;Knowledge and understanding of Target Heart Rate Range (THRR);Understanding of Exercise Prescription;Increase Physical Activity;Able to understand and use Dyspnea scale;Increase Strength and Stamina  Increase Physical Activity;Able to understand and use rate of perceived exertion (RPE) scale;Knowledge and understanding of Target Heart Rate Range (THRR);Increase Strength and Stamina;Able to understand and use Dyspnea scale  Increase Physical Activity;Able to understand and use rate of perceived exertion (RPE) scale;Increase Strength and Stamina  Increase Physical Activity;Able to understand and use rate of perceived exertion (RPE) scale;Increase Strength and Stamina;Able to understand and use Dyspnea scale  Increase Physical Activity;Able to understand and use rate of perceived exertion (RPE) scale;Increase Strength and Stamina;Able to understand and use Dyspnea scale   Comments  Reviewed RPE scale, THR and program prescription with pt today.  Pt voiced understanding and was given a copy of goals to take home.    Reviewed home exercise with pt today.  Pt plans to use TM at home for exercise.  Reviewed THR, pulse, RPE, sign and symptoms, NTG use, and when to call 911 or MD.  Also discussed weather considerations and indoor options.  Pt voiced understanding.  Bud has added speed and incline to TM and increased resistance on RB.  He is porgressing well.  Staff will continue to monitor progress  Bud is progressing well with exercise.  He has increased overall MET level on all equipment.   Bud has been out of [facility] for a little while due to his surgery. He is ready to get back to exercising to improve his shortness of breath.    Expected Outcomes  Short: Use RPE daily to regulate intensity.  Long: Follow program prescription in THR.  Short - Pt will add one day at home in addition to program  sessions Long - Pt will exercise independently  Short - Bud will continue to attend regularly Long - Bud will increase overall MET level   Short - Bud will continue to attend regularly.  Long - Bud will maintain fitness gains  Short: increase levels on all exercises. Long: Maintain exercising post [facility].   [facility] Name 02/17/18 1042 03/02/18 1417 03/09/18 1038 03/15/18 1344       Exercise Goal Re-Evaluation   Exercise Goals Review  Increase Physical Activity;Able to understand and use rate of perceived exertion (RPE) scale;Increase Strength and Stamina;Able to understand and use Dyspnea scale  Increase Physical Activity;Able to understand and use rate of perceived exertion (RPE) scale;Knowledge and understanding of Target Heart Rate Range (THRR);Increase Strength and Stamina;Able to understand and use Dyspnea scale  Understanding of Exercise Prescription;Increase Physical Activity;Increase Strength and Stamina  Increase Physical Activity;Increase Strength and Stamina;Able to understand and use rate of perceived exertion (RPE) scale;Knowledge and understanding of Target Heart Rate Range (THRR)    Comments  Bud is progressing well since returning from surgery.  He is working towards previous level on the TM.  Bud continues to build endurance back after surgery.  Staff will continue to monitor.  Bud is doing well in rehab. He is doing the treadmill at home for 30 min twice a week.  He is still giving out some at home and would like his stamina to be better as he still gets tired when washing dishes.   He has not been doing weights at home and will try to add them in.  He has been able to tolerate steps better now.   Pt has maintained MET level of 3 and up since lung surgery.      Expected Outcomes  Short - Bud will continue to attend and build up to previous level Long - Bud will complete LW and improve overall fitness  Short - Pt will continue to build to previous level Long - Pt will maintain exercise on his  own  Short: Add in weights at home and improve post 6MWT.  Long: Continue to exercise independently.   Short - pt wil complete LW Long - Pt will maintain MET level on his own       Discharge Exercise Prescription (Final Exercise Prescription Changes): Exercise Prescription Changes - 03/15/18 1300      Response to Exercise   Blood Pressure (Admit)  138/70    Blood Pressure (Exit)  120/82    Heart Rate (Admit)  93 bpm    Heart Rate (Exercise)  98 bpm    Heart Rate (Exit)  71 bpm    Oxygen Saturation (Admit)  95 %    Oxygen Saturation (Exercise)  95 %    Oxygen Saturation (Exit)  98 %    Rating of Perceived Exertion (Exercise)  12    Perceived Dyspnea (Exercise)  1    Symptoms  none    Duration  Continue with 45 min of aerobic exercise without signs/symptoms of physical distress.    Intensity  THRR unchanged      Progression   Progression  Continue to progress workloads to maintain intensity without signs/symptoms of physical distress.    Average METs  3.15      Resistance Training   Training Prescription  Yes    Weight  4 lb    Reps  10-15      Interval Training   Interval Training  No      Recumbant Bike  Level  6    Watts  40    Minutes  15    METs  3.3      T5 Nustep   Level  6    Minutes  15    METs  3.1       Nutrition:  Target Goals: Understanding of nutrition guidelines, daily intake of sodium '1500mg'$ , cholesterol '200mg'$ , calories 30% from fat and 7% or less from saturated fats, daily to have 5 or more servings of fruits and vegetables.  Biometrics: Pre Biometrics - 11/30/17 1304      Pre Biometrics   Height  5' 10.25" (1.784 m)    Weight  235 lb 12.8 oz (107 kg)    Waist Circumference  45 inches    Hip Circumference  45.5 inches    Waist to Hip Ratio  0.99 %    BMI (Calculated)  33.61      Post Biometrics - 03/16/18 1434       Post  Biometrics   Height  5' 10.25" (1.784 m)    Weight  213 lb 14.4 oz (97 kg)    Waist Circumference  45.75 inches     Hip Circumference  39.5 inches    Waist to Hip Ratio  1.16 %    BMI (Calculated)  30.49       Nutrition Therapy Plan and Nutrition Goals: Nutrition Therapy & Goals - 12/22/17 1036      Nutrition Therapy   Diet  TLC    Protein (specify units)  9oz    Fiber  37 grams    Saturated Fats  16 max. grams    Fruits and Vegetables  4 servings/day    Sodium  2000 grams      Personal Nutrition Goals   Nutrition Goal  Choose more "wholesome" snacks to help decrease hunger throughout the day, for example add a fruit / protein source    Personal Goal #2  Include sources of fiber and protein at each meal    Personal Goal #3  Increase intake of vegetables each week      Intervention Plan   Intervention  Prescribe, educate and counsel regarding individualized specific dietary modifications aiming towards targeted core components such as weight, hypertension, lipid management, diabetes, heart failure and other comorbidities.    Expected Outcomes  Short Term Goal: Understand basic principles of dietary content, such as calories, fat, sodium, cholesterol and nutrients.;Short Term Goal: A plan has been developed with personal nutrition goals set during dietitian appointment.;Long Term Goal: Adherence to prescribed nutrition plan.       Nutrition Assessments: Nutrition Assessments - 03/09/18 1308      MEDFICTS Scores   Pre Score  60    Post Score  40    Score Difference  -20       Nutrition Goals Re-Evaluation: Nutrition Goals Re-Evaluation    Row Name 12/27/17 1028 02/14/18 1025 03/09/18 1044         Goals   Current Weight  230 lb 1.6 oz (104.4 kg)  216 lb 12.8 oz (98.3 kg)  214 lb (97.1 kg)     Nutrition Goal  Lose weight and eat Healthier.  Lose a litle more weight and eat less  Lose a litle more weight and eat less  More fiber and more protein.  More vegetables.      Comment  He has been eatring more fruit and vegetables. He takes vitamins but no protien of suppliments. He is going  to  work on eating supper earlier.  He has been trying to eat less and has lose a good amount of weight. Bud has been putting less food on his plate to try and lose weight. He would like to be 200 pounds or less for his weight goal.  Bud has been doing well with his diet as he continues to lose weight.  He has been trying to get enough protein and has been increasing his vegetables.  He would like to continue to lose weight     Expected Outcome  Short: eat dinner earlier. Long: maintain eating dinner earlier and not so close to bed time.  Short: lose 5 more pounds. Long: reach a weight of 200 pounds or less.  Short: Continue to try to eat more vegetables.  Long: Continue to work on weight loss.         Nutrition Goals Discharge (Final Nutrition Goals Re-Evaluation): Nutrition Goals Re-Evaluation - 03/09/18 1044      Goals   Current Weight  214 lb (97.1 kg)    Nutrition Goal  Lose a litle more weight and eat less  More fiber and more protein.  More vegetables.     Comment  Bud has been doing well with his diet as he continues to lose weight.  He has been trying to get enough protein and has been increasing his vegetables.  He would like to continue to lose weight    Expected Outcome  Short: Continue to try to eat more vegetables.  Long: Continue to work on weight loss.        Psychosocial: Target Goals: Acknowledge presence or absence of significant depression and/or stress, maximize coping skills, provide positive support system. Participant is able to verbalize types and ability to use techniques and skills needed for reducing stress and depression.   Initial Review & Psychosocial Screening: Initial Psych Review & Screening - 11/30/17 1118      Initial Review   Current issues with  Current Sleep Concerns;Current Stress Concerns    Source of Stress Concerns  Unable to perform yard/household activities;Chronic Illness    Comments  he has been having trouble sleeping lately and gets short of  breath at times.      Family Dynamics   Good Support System?  Yes    Comments  patient has 2 sons and a daughter that live within 10 miles of him.      Barriers   Psychosocial barriers to participate in program  The patient should benefit from training in stress management and relaxation.      Screening Interventions   Interventions  Encouraged to exercise;Provide feedback about the scores to participant;Program counselor consult;To provide support and resources with identified psychosocial needs    Expected Outcomes  Short Term goal: Utilizing psychosocial counselor, staff and physician to assist with identification of specific Stressors or current issues interfering with healing process. Setting desired goal for each stressor or current issue identified.;Long Term Goal: Stressors or current issues are controlled or eliminated.;Short Term goal: Identification and review with participant of any Quality of Life or Depression concerns found by scoring the questionnaire.;Long Term goal: The participant improves quality of Life and PHQ9 Scores as seen by post scores and/or verbalization of changes       Quality of Life Scores:  Scores of 19 and below usually indicate a poorer quality of life in these areas.  A difference of  2-3 points is a clinically meaningful difference.  A difference  of 2-3 points in the total score of the Quality of Life Index has been associated with significant improvement in overall quality of life, self-image, physical symptoms, and general health in studies assessing change in quality of life.  PHQ-9: Recent Review Flowsheet Data    Depression screen [facility] 2/9 03/16/2018 01/05/2018 11/30/2017 07/20/2017   Decreased Interest 3 0 2 1   Down, Depressed, Hopeless 0 0 0 0   PHQ - 2 Score 3 0 2 1   Altered sleeping '3 2 3 3   '$ Tired, decreased energy '3 2 3 3   '$ Change in appetite 0 2 0 3   Feeling bad or failure about yourself  0 0 0 0   Trouble concentrating 0 0 0 0   Moving  slowly or fidgety/restless 0 0 0 0   Suicidal thoughts 0 0 0 0   PHQ-9 Score '9 6 8 10   '$ Difficult doing work/chores Somewhat difficult Somewhat difficult Somewhat difficult Not difficult at all     Interpretation of Total Score  Total Score Depression Severity:  1-4 = Minimal depression, 5-9 = Mild depression, 10-14 = Moderate depression, 15-19 = Moderately severe depression, 20-27 = Severe depression   Psychosocial Evaluation and Intervention: Psychosocial Evaluation - 12/15/17 1043      Psychosocial Evaluation & Interventions   Interventions  Encouraged to exercise with the program and follow exercise prescription    Comments  Counselor met with Mr. Lowenthal [facility]) today for initial psychosocial evaluation.  He is an 84 year old who has COPD.  Bud has a strong support system with a spouse of 65 years and (3) adult children who live locally.  He has chronic sleep problems - although he reports sleeping "all night last night for the first time in 5 years!"  He takes an OTC Melatonin to help with this.  Bud states his appetite is "too good" and he has goals to lose weight while in this program.  He reports a history of anxiety and takes medication for this that he says is "helpful."  Bud is typically in a positive mood and has minimal stress in his life other than his health at this time.  He also has goals just to breathe better in general.  Staff will follow with Bud throughout the course of this program.      Expected Outcomes  Bud will benefit from consistent exercise to achieve his stated goals.  The educational and psychoeducational components of this program will be helpful in understanding and coping with his condition in a more positive way.  Staff will follow with him.     Continue Psychosocial Services   Follow up required by staff       Psychosocial Re-Evaluation: Psychosocial Re-Evaluation    [place] Name 12/27/17 1023 02/07/18 1051 02/14/18 1028 03/09/18 1046       Psychosocial  Re-Evaluation   Current issues with  Current Stress Concerns;Current Sleep Concerns  -  Current Stress Concerns;Current Sleep Concerns  Current Stress Concerns;Current Sleep Concerns    Comments  He has not been sleeping to well lately. He takes melatonin to help him sleep but lately he has been waking up. Sometimes he has to get up to use the bathroom. He is going to try to stop eating and drinking before 730pm  to help reduce waking up in the middle of the night.  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  He is feeling stronger since the surgery, but is still very tired.  Bud states he is also continuing to have difficulty with intermittent sleep currently.  This has been a chronic problem for him in the past.  His mood remains positive and his appetite has returned.  Counselor commended Bud on his commitment to exercise and getting back on track.  He will be followed.    Bud sleeps about 2 hours at a time. He does not know why he wakes up so much. His breathing is his main stress concern since after his surgery. His daughter has to get an opperation on her spinal cord and is putting some stress on him.   Bud had a good night's sleep last night.  It was the best he slept in a while. He tried using the melatonin and started using '10mg'$  versus '5mg'$ .  He still woke up at night but was able to get back to sleep easily.  His daughter did well in surgery but has fallen several time since coming home, so he is still worried about her.  He has enjoyed meeting his classmates and getting stronger and getting out of the house.      Expected Outcomes  Short: try not to eat or drink past 730 for sleep. Long: Maintain eating  before 730pm.  Short - Bud will exercise to increase his energy and recovery from his recent hospitalization.  Long - Bud will continue to work with his Dr. on his chronic sleep problem and  medications that are not being effective.  Short: continue to exercise to improve shortness of breath. Long: Graduate lungworks and continue exercising to reduce stress.  Short: Continue to work on sleeping better.  Long: Continue to cope postively.     Interventions  Encouraged to attend Pulmonary Rehabilitation for the exercise  Stress management education;Relaxation education  Encouraged to attend Pulmonary Rehabilitation for the exercise  Encouraged to attend Pulmonary Rehabilitation for the exercise    Continue Psychosocial Services   -  Follow up required by staff  Follow up required by staff  Follow up required by staff       Psychosocial Discharge (Final Psychosocial Re-Evaluation): Psychosocial Re-Evaluation - 03/09/18 1046      Psychosocial Re-Evaluation   Current issues with  Current Stress Concerns;Current Sleep Concerns    Comments  Bud had a good night's sleep last night.  It was the best he slept in a while. He tried using the melatonin and started using '10mg'$  versus '5mg'$ .  He still woke up at night but was able to get back to sleep easily.  His daughter did well in surgery but has fallen several time since coming home, so he is still worried about her.  He has enjoyed meeting his classmates and getting stronger and getting out of the house.      Expected Outcomes  Short: Continue to work on sleeping better.  Long: Continue to cope postively.     Interventions  Encouraged to attend Pulmonary Rehabilitation for the exercise    Continue Psychosocial Services   Follow up required by staff       Education: Education Goals: Education classes will be provided on a weekly basis, covering required topics. Participant will state understanding/return demonstration of topics presented.  Learning Barriers/Preferences: Learning Barriers/Preferences - 11/30/17 1125      Learning Barriers/Preferences   Learning Barriers  Reading;Sight wears glasses    Learning  Preferences  Individual  Instruction;Verbal Instruction;Video       Education Topics:  Initial Evaluation Education: - Verbal, written and demonstration of respiratory meds, oximetry and breathing techniques. Instruction on use of nebulizers and MDIs and importance of monitoring MDI activations.   Pulmonary Rehab from 03/23/2018 in [facility] Cardiac and Pulmonary Rehab  Date  11/30/17  Educator  [facility]  Instruction Review Code  1- Verbalizes Understanding      General Nutrition Guidelines/Fats and Fiber: -Group instruction provided by verbal, written material, models and posters to present the general guidelines for heart healthy nutrition. Gives an explanation and review of dietary fats and fiber.   Pulmonary Rehab from 03/23/2018 in [facility] Cardiac and Pulmonary Rehab  Date  03/14/18  Educator  CR  Instruction Review Code  1- Verbalizes Understanding      Controlling Sodium/Reading Food Labels: -Group verbal and written material supporting the discussion of sodium use in heart healthy nutrition. Review and explanation with models, verbal and written materials for utilization of the food label.   Exercise Physiology & General Exercise Guidelines: - Group verbal and written instruction with models to review the exercise physiology of the cardiovascular system and associated critical values. Provides general exercise guidelines with specific guidelines to those with heart or lung disease.    Aerobic Exercise & Resistance Training: - Gives group verbal and written instruction on the various components of exercise. Focuses on aerobic and resistive training programs and the benefits of this training and how to safely progress through these programs.   Pulmonary Rehab from 03/23/2018 in [facility] Cardiac and Pulmonary Rehab  Date  02/18/18  Educator  [facility]  Instruction Review Code  1- Verbalizes Understanding      Flexibility, Balance, Mind/Body Relaxation: Provides group verbal/written instruction on the benefits of  flexibility and balance training, including mind/body exercise modes such as yoga, pilates and tai chi.  Demonstration and skill practice provided.   Pulmonary Rehab from 03/23/2018 in [facility] Cardiac and Pulmonary Rehab  Date  03/02/18  Educator  AS  Instruction Review Code  1- Verbalizes Understanding      Stress and Anxiety: - Provides group verbal and written instruction about the health risks of elevated stress and causes of high stress.  Discuss the correlation between heart/lung disease and anxiety and treatment options. Review healthy ways to manage with stress and anxiety.   Pulmonary Rehab from 03/23/2018 in [facility] Cardiac and Pulmonary Rehab  Date  03/23/18  Educator  [facility]  Instruction Review Code  1- Verbalizes Understanding      Depression: - Provides group verbal and written instruction on the correlation between heart/lung disease and depressed mood, treatment options, and the stigmas associated with seeking treatment.   Pulmonary Rehab from 03/23/2018 in [facility] Cardiac and Pulmonary Rehab  Date  12/15/17  Educator  [facility]  Instruction Review Code  1- Verbalizes Understanding      Exercise & Equipment Safety: - Individual verbal instruction and demonstration of equipment use and safety with use of the equipment.   Pulmonary Rehab from 03/23/2018 in [facility] Cardiac and Pulmonary Rehab  Date  11/30/17  Educator  [facility]  Instruction Review Code  1- Verbalizes Understanding      Infection Prevention: - Provides verbal and written material to individual with discussion of infection control including proper hand washing and proper equipment cleaning during exercise session.   Pulmonary Rehab from 03/23/2018 in [facility] Cardiac and Pulmonary Rehab  Date  11/30/17  Educator  [facility]  Instruction Review Code  1- Verbalizes Understanding      Falls Prevention: - Provides verbal and written material to individual with discussion of falls prevention and safety.   Pulmonary Rehab from 03/23/2018 in [facility]  Cardiac and Pulmonary Rehab  Date  11/30/17  Educator  [facility]  Instruction Review Code  1- Verbalizes Understanding      Diabetes: - Individual verbal and written instruction to review signs/symptoms of diabetes, desired ranges of glucose level fasting, after meals and with exercise. Advice that pre and post exercise glucose checks will be done for 3 sessions at entry of program.   Chronic Lung Diseases: - Group verbal and written instruction to review updates, respiratory medications, advancements in procedures and treatments. Discuss use of supplemental oxygen including available portable oxygen systems, continuous and intermittent flow rates, concentrators, personal use and safety guidelines. Review proper use of inhaler and spacers. Provide informative websites for self-education.    Energy Conservation: - Provide group verbal and written instruction for methods to conserve energy, plan and organize activities. Instruct on pacing techniques, use of adaptive equipment and posture/positioning to relieve shortness of breath.   Pulmonary Rehab from 03/23/2018 in [facility] Cardiac and Pulmonary Rehab  Date  03/16/18  Educator  [facility]  Instruction Review Code  1- Verbalizes Understanding      Triggers and Exacerbations: - Group verbal and written instruction to review types of environmental triggers and ways to prevent exacerbations. Discuss weather changes, air quality and the benefits of nasal washing. Review warning signs and symptoms to help prevent infections. Discuss techniques for effective airway clearance, coughing, and vibrations.   Pulmonary Rehab from 03/23/2018 in [facility] Cardiac and Pulmonary Rehab  Date  02/16/18  Educator  [facility]  Instruction Review Code  1- Verbalizes Understanding      AED/CPR: - Group verbal and written instruction with the use of models to demonstrate the basic use of the AED with the basic ABC's of resuscitation.   Pulmonary Rehab from 03/23/2018 in [facility] Cardiac and  Pulmonary Rehab  Date  03/18/18  Educator  [place]  Instruction Review Code  1- Verbalizes Understanding      Anatomy and Physiology of the Lungs: - Group verbal and written instruction with the use of models to provide basic lung anatomy and physiology related to function, structure and complications of lung disease.   Pulmonary Rehab from 03/23/2018 in [facility] Cardiac and Pulmonary Rehab  Date  01/05/18  Educator  [facility]  Instruction Review Code  1- Verbalizes Understanding      Anatomy & Physiology of the Heart: - Group verbal and written instruction and models provide basic cardiac anatomy and physiology, with the coronary electrical and arterial systems. Review of Valvular disease and Heart Failure   Pulmonary Rehab from 03/23/2018 in [facility] Cardiac and Pulmonary Rehab  Date  02/23/18  Educator  [facility]  Instruction Review Code  1- Verbalizes Understanding      Cardiac Medications: - Group verbal and written instruction to review commonly prescribed medications for heart disease. Reviews the medication, class of the drug, and side effects.   Pulmonary Rehab from 03/23/2018 in [facility] Cardiac and Pulmonary Rehab  Date  03/04/18  Educator  [facility]  Instruction Review Code  1- Verbalizes Understanding      Know Your Numbers and Risk Factors: -Group verbal and written instruction about important numbers in your health.  Discussion of what are risk factors and how they play a role in the disease process.  Review of Cholesterol, Blood Pressure, Diabetes, and BMI  and the role they play in your overall health.   Sleep Hygiene: -Provides group verbal and written instruction about how sleep can affect your health.  Define sleep hygiene, discuss sleep cycles and impact of sleep habits. Review good sleep hygiene tips.    Other: -Provides group and verbal instruction on various topics (see comments)    Knowledge Questionnaire Score: Knowledge Questionnaire Score - 03/09/18 1305      Knowledge  Questionnaire Score   Pre Score  16/18    Post Score  15/18 results reviewed with pt today        Core Components/Risk Factors/Patient Goals at Admission: Personal Goals and Risk Factors at Admission - 11/30/17 1128      Core Components/Risk Factors/Patient Goals on Admission    Weight Management  Yes;Weight Loss    Intervention  Weight Management: Develop a combined nutrition and exercise program designed to reach desired caloric intake, while maintaining appropriate intake of nutrient and fiber, sodium and fats, and appropriate energy expenditure required for the weight goal.;Weight Management: Provide education and appropriate resources to help participant work on and attain dietary goals.;Weight Management/Obesity: Establish reasonable short term and long term weight goals.;Obesity: Provide education and appropriate resources to help participant work on and attain dietary goals.    Admit Weight  235 lb 12.8 oz (107 kg)    Goal Weight: Short Term  230 lb (104.3 kg)    Goal Weight: Long Term  200 lb (90.7 kg)    Expected Outcomes  Short Term: Continue to assess and modify interventions until short term weight is achieved;Weight Maintenance: Understanding of the daily nutrition guidelines, which includes 25-35% calories from fat, 7% or less cal from saturated fats, less than '200mg'$  cholesterol, less than 1.5gm of sodium, & 5 or more servings of fruits and vegetables daily;Long Term: Adherence to nutrition and physical activity/exercise program aimed toward attainment of established weight goal;Weight Loss: Understanding of general recommendations for a balanced deficit meal plan, which promotes 1-2 lb weight loss per week and includes a negative energy balance of [phone] kcal/d;Understanding recommendations for meals to include 15-35% energy as protein, 25-35% energy from fat, 35-60% energy from carbohydrates, less than '200mg'$  of dietary cholesterol, 20-35 gm of total fiber daily;Understanding of  distribution of calorie intake throughout the day with the consumption of 4-5 meals/snacks    Improve shortness of breath with ADL's  Yes    Intervention  Provide education, individualized exercise plan and daily activity instruction to help decrease symptoms of SOB with activities of daily living.    Expected Outcomes  Short Term: Improve cardiorespiratory fitness to achieve a reduction of symptoms when performing ADLs;Long Term: Be able to perform more ADLs without symptoms or delay the onset of symptoms    Hypertension  Yes    Intervention  Provide education on lifestyle modifcations including regular physical activity/exercise, weight management, moderate sodium restriction and increased consumption of fresh fruit, vegetables, and low fat dairy, alcohol moderation, and smoking cessation.;Monitor prescription use compliance.    Expected Outcomes  Short Term: Continued assessment and intervention until BP is < 140/14m HG in hypertensive participants. < 130/848mHG in hypertensive participants with diabetes, heart failure or chronic kidney disease.;Long Term: Maintenance of blood pressure at goal levels.    Lipids  Yes    Intervention  Provide education and support for participant on nutrition & aerobic/resistive exercise along with prescribed medications to achieve LDL '70mg'$ , HDL >'40mg'$ .    Expected Outcomes  Short Term: Participant states understanding  of desired cholesterol values and is compliant with medications prescribed. Participant is following exercise prescription and nutrition guidelines.;Long Term: Cholesterol controlled with medications as prescribed, with individualized exercise RX and with personalized nutrition plan. Value goals: LDL < '70mg'$ , HDL > 40 mg.       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 His weight has gone down 5 pounds. He wants to continue with his weight loss. Kimarion blood pressure is getting better since he started the program.  Bud's weight has improved since he started the program. His blood pressure has also seen an improvement. He has been doing well in the program but his shortness of breath took a hit since his surgery.  Bud's weight is down to 214lbs and he continues to lose.  His blood pressures have been in class and he does check them at home.  He continues to get short of breath but it has gotten better.  He would like to continue as encouraged by his surgeon.  We will see if we can get him back with a different diagnosis to be able to continue in rehab.     Expected Outcomes  Short: continue [facility] to lose more weight. Long: maintain an exercise routine to continue weight loss  Short: continue to exercise to regain strength. Long: maintain exericse to improve ADL.  Short: Continue to work on weight loss.  Long; Continue to try to improve his breathing.         Core Components/Risk Factors/Patient Goals at Discharge (Final Review):  Goals and Risk Factor Review - 03/09/18 1041      Core Components/Risk Factors/Patient Goals Review   Personal Goals Review  Weight Management/Obesity;Improve shortness of breath with ADL's;Lipids;Hypertension    Review  Bud's weight is down to 214lbs and he continues to lose.  His blood pressures have been in class and he does check them at home.  He continues to get short of breath but it has gotten better.  He would like to continue as encouraged by his surgeon.  We will see if we can get him back with a different  diagnosis to be able to continue in rehab.    Expected Outcomes  Short: Continue to work on weight loss.  Long; Continue to try to improve his breathing.        ITP Comments: ITP Comments    Row Name 11/30/17 1143 11/30/17 1144 12/06/17 0905 12/29/17 1026 01/03/18 0815   ITP Comments  Medical Evaluation completed. Chart sent for review and changes to Dr. Emily Filbert Director of [facility]. Diagnosis can be found in [facility] encounter 11/15/17  Met face to face with Doctor Emily Filbert director of [place]. Chart Reviewed and signed.  30 day review completed. ITP sent to Dr. Emily Filbert Director of [facility]. Continue with ITP unless changes are made by physician.  Patient met with Dr Sabra Heck director of [facility] for face to face. Chart reviwed and signed. Continue with exercise prescription unless told otherwise.  30 day review completed. ITP sent to Dr. Emily Filbert Director of  [facility]. Continue with ITP unless changes are made by physician.   Lena Name 01/31/18 0820 01/31/18 0821 01/31/18 1609 02/07/18 0944 02/28/18 0820   ITP Comments  Patient has not been in class due to a procedure and was not able to meet Dr. Sabra Heck face to face on 01/28/18.   30 day review completed. ITP sent to Dr. Emily Filbert Director of [facility]. Continue with ITP unless changes are made by physician  Spoke to Mr. Colemans wife about him returning to [place]. He was released from the hospital yesterday. Informed her that He will need a note from the doctor that it is ok to exercise. Gave patients wife fax number. Patients wife verbalizes understanding.  Patient met with Dr. Lequita Halt who signed for Dr. Emily Filbert director of [place] for face to face time. Chart reviewed and signed.   30 day review completed. ITP sent to Dr. Emily Filbert Director of [facility]. Continue with ITP unless changes are made by physician   Row Name 03/09/18 1020 03/23/18 1123         ITP Comments  Met face to face with Dr. Ramonita Lab (Dr. Emily Filbert,  Medical Director).  Chart was reviewed and signed.   Discharge ITP sent and signed by Dr. Sabra Heck.  Discharge Summary routed to PCP and pulmonologist.         Comments: Discharge ITP

## 2018-05-16 ENCOUNTER — Other Ambulatory Visit: Payer: Self-pay

## 2018-05-16 ENCOUNTER — Inpatient Hospital Stay
Admission: EM | Admit: 2018-05-16 | Discharge: 2018-05-20 | DRG: 309 | Disposition: A | Discharge status: Home Health | Payer: No Typology Code available for payment source | Attending: Internal Medicine | Admitting: Internal Medicine

## 2018-05-16 ENCOUNTER — Encounter: Payer: Self-pay | Admitting: Emergency Medicine

## 2018-05-16 ENCOUNTER — Emergency Department: Payer: No Typology Code available for payment source

## 2018-05-16 DIAGNOSIS — I2510 Atherosclerotic heart disease of native coronary artery without angina pectoris: Secondary | ICD-10-CM | POA: Diagnosis present

## 2018-05-16 DIAGNOSIS — I5032 Chronic diastolic (congestive) heart failure: Secondary | ICD-10-CM | POA: Diagnosis present

## 2018-05-16 DIAGNOSIS — J440 Chronic obstructive pulmonary disease with acute lower respiratory infection: Secondary | ICD-10-CM | POA: Diagnosis present

## 2018-05-16 DIAGNOSIS — J9611 Chronic respiratory failure with hypoxia: Secondary | ICD-10-CM | POA: Diagnosis present

## 2018-05-16 DIAGNOSIS — K219 Gastro-esophageal reflux disease without esophagitis: Secondary | ICD-10-CM | POA: Diagnosis present

## 2018-05-16 DIAGNOSIS — Z955 Presence of coronary angioplasty implant and graft: Secondary | ICD-10-CM

## 2018-05-16 DIAGNOSIS — Y92410 Unspecified street and highway as the place of occurrence of the external cause: Secondary | ICD-10-CM | POA: Diagnosis not present

## 2018-05-16 DIAGNOSIS — R55 Syncope and collapse: Secondary | ICD-10-CM

## 2018-05-16 DIAGNOSIS — I482 Chronic atrial fibrillation, unspecified: Secondary | ICD-10-CM

## 2018-05-16 DIAGNOSIS — R001 Bradycardia, unspecified: Secondary | ICD-10-CM | POA: Diagnosis present

## 2018-05-16 DIAGNOSIS — E119 Type 2 diabetes mellitus without complications: Secondary | ICD-10-CM | POA: Diagnosis present

## 2018-05-16 DIAGNOSIS — Z951 Presence of aortocoronary bypass graft: Secondary | ICD-10-CM

## 2018-05-16 DIAGNOSIS — Z8249 Family history of ischemic heart disease and other diseases of the circulatory system: Secondary | ICD-10-CM

## 2018-05-16 DIAGNOSIS — Z87891 Personal history of nicotine dependence: Secondary | ICD-10-CM

## 2018-05-16 DIAGNOSIS — C3411 Malignant neoplasm of upper lobe, right bronchus or lung: Secondary | ICD-10-CM | POA: Diagnosis present

## 2018-05-16 DIAGNOSIS — Z7982 Long term (current) use of aspirin: Secondary | ICD-10-CM | POA: Diagnosis not present

## 2018-05-16 DIAGNOSIS — I110 Hypertensive heart disease with heart failure: Secondary | ICD-10-CM | POA: Diagnosis present

## 2018-05-16 DIAGNOSIS — Z7901 Long term (current) use of anticoagulants: Secondary | ICD-10-CM | POA: Diagnosis not present

## 2018-05-16 DIAGNOSIS — E782 Mixed hyperlipidemia: Secondary | ICD-10-CM | POA: Diagnosis present

## 2018-05-16 DIAGNOSIS — J209 Acute bronchitis, unspecified: Secondary | ICD-10-CM | POA: Diagnosis present

## 2018-05-16 DIAGNOSIS — Z9981 Dependence on supplemental oxygen: Secondary | ICD-10-CM

## 2018-05-16 DIAGNOSIS — S2232XA Fracture of one rib, left side, initial encounter for closed fracture: Secondary | ICD-10-CM | POA: Diagnosis present

## 2018-05-16 DIAGNOSIS — S32019A Unspecified fracture of first lumbar vertebra, initial encounter for closed fracture: Secondary | ICD-10-CM | POA: Diagnosis present

## 2018-05-16 DIAGNOSIS — I361 Nonrheumatic tricuspid (valve) insufficiency: Secondary | ICD-10-CM | POA: Diagnosis not present

## 2018-05-16 DIAGNOSIS — Z7984 Long term (current) use of oral hypoglycemic drugs: Secondary | ICD-10-CM | POA: Diagnosis not present

## 2018-05-16 DIAGNOSIS — S32020A Wedge compression fracture of second lumbar vertebra, initial encounter for closed fracture: Secondary | ICD-10-CM

## 2018-05-16 DIAGNOSIS — I272 Pulmonary hypertension, unspecified: Secondary | ICD-10-CM

## 2018-05-16 DIAGNOSIS — R918 Other nonspecific abnormal finding of lung field: Secondary | ICD-10-CM | POA: Diagnosis not present

## 2018-05-16 DIAGNOSIS — G2581 Restless legs syndrome: Secondary | ICD-10-CM | POA: Diagnosis present

## 2018-05-16 DIAGNOSIS — S32000A Wedge compression fracture of unspecified lumbar vertebra, initial encounter for closed fracture: Secondary | ICD-10-CM | POA: Diagnosis present

## 2018-05-16 DIAGNOSIS — Z8711 Personal history of peptic ulcer disease: Secondary | ICD-10-CM

## 2018-05-16 DIAGNOSIS — S51012A Laceration without foreign body of left elbow, initial encounter: Secondary | ICD-10-CM | POA: Diagnosis present

## 2018-05-16 DIAGNOSIS — F419 Anxiety disorder, unspecified: Secondary | ICD-10-CM | POA: Diagnosis present

## 2018-05-16 LAB — TROPONIN I: Troponin I: <0.03 ng/mL (ref <0.03)

## 2018-05-16 LAB — CBC WITH DIFFERENTIAL/PLATELET
BASOS ABS: 0 10*3/uL (ref 0–0.1)
BASOS PCT: 0 %
EOS PCT: 0 %
Eosinophils Absolute: 0.1 10*3/uL (ref 0–0.7)
HCT: 39.5 % — ABNORMAL LOW (ref 40.0–52.0)
Hemoglobin: 13 g/dL (ref 13.0–18.0)
LYMPHS PCT: 4 %
Lymphs Abs: 0.7 10*3/uL — ABNORMAL LOW (ref 1.0–3.6)
MCH: 27.1 pg (ref 26.0–34.0)
MCHC: 32.9 g/dL (ref 32.0–36.0)
MCV: 82.3 fL (ref 80.0–100.0)
Monocytes Absolute: 0.9 10*3/uL (ref 0.2–1.0)
Monocytes Relative: 5 %
Neutro Abs: 14.8 10*3/uL — ABNORMAL HIGH (ref 1.4–6.5)
Neutrophils Relative %: 91 %
PLATELETS: 224 10*3/uL (ref 150–440)
RBC: 4.8 MIL/uL (ref 4.40–5.90)
RDW: 16.5 % — ABNORMAL HIGH (ref 11.5–14.5)
WBC: 16.4 10*3/uL — AB (ref 3.8–10.6)

## 2018-05-16 LAB — BASIC METABOLIC PANEL
ANION GAP: 7 (ref 5–15)
BUN: 23 mg/dL (ref 8–23)
CO2: 28 mmol/L (ref 22–32)
Calcium: 8.6 mg/dL — ABNORMAL LOW (ref 8.9–10.3)
Chloride: 102 mmol/L (ref 98–111)
Creatinine, Ser: 1.06 mg/dL (ref 0.61–1.24)
GLUCOSE: 154 mg/dL — AB (ref 70–99)
Potassium: 4.2 mmol/L (ref 3.5–5.1)
Sodium: 137 mmol/L (ref 135–145)

## 2018-05-16 LAB — GLUCOSE, CAPILLARY: GLUCOSE-CAPILLARY: 115 mg/dL — AB (ref 70–99)

## 2018-05-16 MED ORDER — TRAMADOL HCL 50 MG PO TABS: 50.0000 mg | ORAL_TABLET | Freq: Four times a day (QID) | ORAL | 0 refills | Status: DC | PRN

## 2018-05-16 MED ORDER — ONDANSETRON HCL 4 MG PO TABS: 4.0000 mg | ORAL_TABLET | Freq: Four times a day (QID) | ORAL | Status: DC | PRN

## 2018-05-16 MED ORDER — ALBUTEROL SULFATE (2.5 MG/3ML) 0.083% IN NEBU
3.0000 mL | INHALATION_SOLUTION | Freq: Four times a day (QID) | RESPIRATORY_TRACT | Status: DC | PRN
  Administered 2018-05-17: 3 mL via RESPIRATORY_TRACT
  Filled 2018-05-16: qty 3

## 2018-05-16 MED ORDER — MELATONIN 5 MG PO TABS
1.0000 | ORAL_TABLET | Freq: Every day | ORAL | Status: DC
  Administered 2018-05-17 – 2018-05-19 (×3): 5 mg via ORAL
  Filled 2018-05-16 (×5): qty 1

## 2018-05-16 MED ORDER — PRAMIPEXOLE DIHYDROCHLORIDE 0.25 MG PO TABS
0.5000 mg | ORAL_TABLET | Freq: Two times a day (BID) | ORAL | Status: DC
  Administered 2018-05-16 – 2018-05-20 (×8): 0.5 mg via ORAL
  Filled 2018-05-16 (×8): qty 2

## 2018-05-16 MED ORDER — DOCUSATE SODIUM 100 MG PO CAPS
100.0000 mg | ORAL_CAPSULE | Freq: Two times a day (BID) | ORAL | Status: DC
  Administered 2018-05-16 – 2018-05-20 (×8): 100 mg via ORAL
  Filled 2018-05-16 (×8): qty 1

## 2018-05-16 MED ORDER — ACETAMINOPHEN 650 MG RE SUPP: 650.0000 mg | Freq: Four times a day (QID) | RECTAL | Status: DC | PRN

## 2018-05-16 MED ORDER — VITAMIN D 1000 UNITS PO TABS
1000.0000 [IU] | ORAL_TABLET | Freq: Every day | ORAL | Status: DC
  Administered 2018-05-17 – 2018-05-20 (×4): 1000 [IU] via ORAL
  Filled 2018-05-16 (×4): qty 1

## 2018-05-16 MED ORDER — APIXABAN 5 MG PO TABS
5.0000 mg | ORAL_TABLET | Freq: Two times a day (BID) | ORAL | Status: DC
  Administered 2018-05-16 – 2018-05-20 (×8): 5 mg via ORAL
  Filled 2018-05-16 (×8): qty 1

## 2018-05-16 MED ORDER — SPIRONOLACTONE 25 MG PO TABS
25.0000 mg | ORAL_TABLET | Freq: Every day | ORAL | Status: DC
  Administered 2018-05-17 – 2018-05-20 (×4): 25 mg via ORAL
  Filled 2018-05-16 (×4): qty 1

## 2018-05-16 MED ORDER — ACETAMINOPHEN 325 MG PO TABS
650.0000 mg | ORAL_TABLET | Freq: Four times a day (QID) | ORAL | Status: DC | PRN
  Administered 2018-05-17 – 2018-05-20 (×2): 650 mg via ORAL
  Filled 2018-05-16 (×2): qty 2

## 2018-05-16 MED ORDER — MAGNESIUM OXIDE 400 (241.3 MG) MG PO TABS
400.0000 mg | ORAL_TABLET | Freq: Every day | ORAL | Status: DC
  Administered 2018-05-17 – 2018-05-20 (×4): 400 mg via ORAL
  Filled 2018-05-16 (×4): qty 1

## 2018-05-16 MED ORDER — FUROSEMIDE 40 MG PO TABS
40.0000 mg | ORAL_TABLET | Freq: Every day | ORAL | Status: DC
  Administered 2018-05-17: 40 mg via ORAL
  Filled 2018-05-16: qty 1

## 2018-05-16 MED ORDER — HYDROMORPHONE HCL 1 MG/ML IJ SOLN: 0.5000 mg | INTRAMUSCULAR | Status: DC | PRN

## 2018-05-16 MED ORDER — ONDANSETRON HCL 4 MG/2ML IJ SOLN
4.0000 mg | Freq: Four times a day (QID) | INTRAMUSCULAR | Status: DC | PRN
  Administered 2018-05-19 – 2018-05-20 (×3): 4 mg via INTRAVENOUS
  Filled 2018-05-16 (×3): qty 2

## 2018-05-16 MED ORDER — INSULIN ASPART 100 UNIT/ML ~~LOC~~ SOLN: 0.0000 [IU] | Freq: Three times a day (TID) | SUBCUTANEOUS | Status: DC

## 2018-05-16 MED ORDER — IOPAMIDOL (ISOVUE-300) INJECTION 61%
100.0000 mL | Freq: Once | INTRAVENOUS | Status: AC | PRN
  Administered 2018-05-16: 100 mL via INTRAVENOUS

## 2018-05-16 MED ORDER — PANTOPRAZOLE SODIUM 40 MG PO TBEC
40.0000 mg | DELAYED_RELEASE_TABLET | Freq: Every day | ORAL | Status: DC
  Administered 2018-05-17: 40 mg via ORAL
  Filled 2018-05-16: qty 1

## 2018-05-16 MED ORDER — VITAMIN B-12 1000 MCG PO TABS
500.0000 ug | ORAL_TABLET | Freq: Every day | ORAL | Status: DC
  Administered 2018-05-17 – 2018-05-20 (×4): 500 ug via ORAL
  Filled 2018-05-16 (×4): qty 1

## 2018-05-16 MED ORDER — FLUTICASONE-UMECLIDIN-VILANT 100-62.5-25 MCG/INH IN AEPB: INHALATION_SPRAY | Freq: Every day | RESPIRATORY_TRACT | Status: DC

## 2018-05-16 MED ORDER — LIDOCAINE HCL (PF) 1 % IJ SOLN
5.0000 mL | Freq: Once | INTRAMUSCULAR | Status: AC
  Administered 2018-05-16: 5 mL
  Filled 2018-05-16: qty 5

## 2018-05-16 MED ORDER — BISACODYL 5 MG PO TBEC
5.0000 mg | DELAYED_RELEASE_TABLET | Freq: Every day | ORAL | Status: DC | PRN
  Administered 2018-05-19: 5 mg via ORAL
  Filled 2018-05-16: qty 1

## 2018-05-16 MED ORDER — SIMVASTATIN 20 MG PO TABS
20.0000 mg | ORAL_TABLET | Freq: Every day | ORAL | Status: DC
  Administered 2018-05-17 – 2018-05-19 (×3): 20 mg via ORAL
  Filled 2018-05-16 (×3): qty 1

## 2018-05-16 MED ORDER — TRAZODONE HCL 50 MG PO TABS
25.0000 mg | ORAL_TABLET | Freq: Every evening | ORAL | Status: DC | PRN
  Administered 2018-05-18 – 2018-05-19 (×2): 25 mg via ORAL
  Filled 2018-05-16 (×2): qty 1

## 2018-05-16 MED ORDER — METOPROLOL TARTRATE 25 MG PO TABS
25.0000 mg | ORAL_TABLET | Freq: Two times a day (BID) | ORAL | Status: DC
  Administered 2018-05-16 – 2018-05-17 (×2): 25 mg via ORAL
  Filled 2018-05-16 (×2): qty 1

## 2018-05-16 MED ORDER — HYDROCODONE-ACETAMINOPHEN 5-325 MG PO TABS
1.0000 | ORAL_TABLET | ORAL | Status: DC | PRN
  Administered 2018-05-18 – 2018-05-19 (×3): 1 via ORAL
  Filled 2018-05-16 (×3): qty 1

## 2018-05-16 MED ORDER — FENTANYL CITRATE (PF) 100 MCG/2ML IJ SOLN
50.0000 ug | Freq: Once | INTRAMUSCULAR | Status: AC
  Administered 2018-05-16: 50 ug via INTRAVENOUS
  Filled 2018-05-16: qty 2

## 2018-05-16 MED ORDER — MONTELUKAST SODIUM 10 MG PO TABS
10.0000 mg | ORAL_TABLET | Freq: Every day | ORAL | Status: DC
  Administered 2018-05-16 – 2018-05-19 (×4): 10 mg via ORAL
  Filled 2018-05-16 (×4): qty 1

## 2018-05-16 MED ORDER — OCUVITE-LUTEIN PO CAPS
1.0000 | ORAL_CAPSULE | Freq: Every day | ORAL | Status: DC
  Administered 2018-05-17 – 2018-05-20 (×4): 1 via ORAL
  Filled 2018-05-16 (×4): qty 1

## 2018-05-16 MED ORDER — IPRATROPIUM-ALBUTEROL 0.5-2.5 (3) MG/3ML IN SOLN
3.0000 mL | Freq: Four times a day (QID) | RESPIRATORY_TRACT | Status: DC | PRN
  Administered 2018-05-18 – 2018-05-19 (×2): 3 mL via RESPIRATORY_TRACT
  Filled 2018-05-16 (×2): qty 3

## 2018-05-16 MED ORDER — CEPHALEXIN 500 MG PO CAPS: 500.0000 mg | ORAL_CAPSULE | Freq: Four times a day (QID) | ORAL | 0 refills | Status: DC

## 2018-05-16 MED ORDER — INSULIN ASPART 100 UNIT/ML ~~LOC~~ SOLN: 0.0000 [IU] | Freq: Every day | SUBCUTANEOUS | Status: DC

## 2018-05-16 MED ORDER — SODIUM CHLORIDE 0.9 % IV SOLN
Freq: Once | INTRAVENOUS | Status: AC
  Administered 2018-05-16: via INTRAVENOUS

## 2018-05-16 MED ORDER — MORPHINE SULFATE (PF) 4 MG/ML IV SOLN
4.0000 mg | Freq: Once | INTRAVENOUS | Status: AC
  Administered 2018-05-16: 4 mg via INTRAVENOUS
  Filled 2018-05-16: qty 1

## 2018-05-16 NOTE — ED Notes (Signed)
Fritz Pickerel from [facility] at bedside with patient at this time applying brace.

## 2018-05-16 NOTE — ED Notes (Signed)
Fritz Pickerel with [facility] called RN back and reported he will arrive to the ED as soon as possible. Arrival time unknown.

## 2018-05-16 NOTE — ED Notes (Addendum)
Family came out stating pt's HR was 120. Went into room and placed pt on cardiac monitor. Family confirmed that pt does have Afib. Pt complaining of right chest pain. EKG performed and given to MD. MD at bedside

## 2018-05-16 NOTE — Discharge Instructions (Signed)
Please seek medical attention for any high fevers, chest pain, shortness of breath, change in behavior, persistent vomiting, bloody stool or any other new or concerning symptoms.  

## 2018-05-16 NOTE — ED Notes (Signed)
RN called [facility] for TLSO brace application.

## 2018-05-16 NOTE — ED Notes (Signed)
Patient transported to X-ray 

## 2018-05-16 NOTE — ED Triage Notes (Signed)
Pt placed on 2L of oxygen due to oxygen sats fluctuating.

## 2018-05-16 NOTE — ED Notes (Signed)
Pt's arm cleaned and wrapped up with gauze at this time. Pt resting comfortably

## 2018-05-16 NOTE — ED Notes (Signed)
Pt given urinal.

## 2018-05-16 NOTE — ED Provider Notes (Addendum)
-----------------------------------------   6:17 PM on 05/16/2018 -----------------------------------------  ED ECG REPORT I, Arta Silence, the attending physician, personally viewed and interpreted this ECG.  Date: 05/16/2018 EKG Time: 1746 Rate: 66 Rhythm: Atrial fibrillation with occasional PVCs QRS Axis: normal Intervals: normal ST/T Wave abnormalities: normal Narrative Interpretation: no evidence of acute ischemia  ----------------------------------------- 9:22 PM on 05/16/2018 -----------------------------------------  I took over care on this patient from Dr. Archie Balboa.  The patient was pending a CT of the abdomen at the time of signout because of concern that he could have an intra-abdominal injury with the seatbelt sign seen on exam.  The CT showed several transverse process lumbar fractures and an acute lumbar compression fracture, as well as a left-sided rib fracture.  I consulted Dr. Lacinda Axon from neurosurgery who advised that these findings were nonoperative and recommended a TLSO brace and outpatient follow-up.  The brace was applied and the patient sent for repeat films as per Dr. Jonathon Jordan instructions.  However, after returning we attempted to ambulate the patient and he continues to have relatively severe pain and is not able to stand and walk on his own.  He reports feeling lightheaded.  His vital signs remained stable and his lab work-up is reassuring except for elevated WBC count.  Given his acute fractures, the level of pain, his dependence on oxygen at home, and the inability to ambulate, he will require admission.  I signed the patient out to the hospitalist Dr. Duane Boston.     Arta Silence, MD 05/16/18 2124
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
## 2018-05-16 NOTE — ED Notes (Signed)
Samantha RN, aware of bed assigned

## 2018-05-16 NOTE — ED Notes (Signed)
Pt advised of needing urine sample when able to provide one.
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
## 2018-05-16 NOTE — ED Notes (Signed)
Brace in place by [employer] Fritz Pickerel).
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
## 2018-05-16 NOTE — ED Notes (Signed)
Pt back from CT

## 2018-05-16 NOTE — ED Notes (Addendum)
Patient transported to CT 

## 2018-05-16 NOTE — ED Notes (Signed)
Attempted to ambulate pt with assistance from [place] EDT and pt states he feels swimmy headed and not balanced enough to walk. Pt assisted back into bed at this time. MD informed.

## 2018-05-17 ENCOUNTER — Inpatient Hospital Stay (HOSPITAL_COMMUNITY)
Admit: 2018-05-17 | Discharge: 2018-05-17 | Disposition: A | Discharge status: Home / Self Care | Payer: No Typology Code available for payment source | Attending: Internal Medicine | Admitting: Internal Medicine

## 2018-05-17 DIAGNOSIS — I361 Nonrheumatic tricuspid (valve) insufficiency: Secondary | ICD-10-CM

## 2018-05-17 DIAGNOSIS — R55 Syncope and collapse: Secondary | ICD-10-CM

## 2018-05-17 DIAGNOSIS — I482 Chronic atrial fibrillation: Secondary | ICD-10-CM

## 2018-05-17 LAB — URINALYSIS, COMPLETE (UACMP) WITH MICROSCOPIC
BACTERIA UA: NONE SEEN
BILIRUBIN URINE: NEGATIVE
Glucose, UA: NEGATIVE mg/dL
Hgb urine dipstick: NEGATIVE
KETONES UR: NEGATIVE mg/dL
LEUKOCYTES UA: NEGATIVE
Nitrite: NEGATIVE
Protein, ur: NEGATIVE mg/dL
Specific Gravity, Urine: 1.04 — ABNORMAL HIGH (ref 1.005–1.030)
pH: 6 (ref 5.0–8.0)

## 2018-05-17 LAB — CBC
HEMATOCRIT: 39.3 % — AB (ref 40.0–52.0)
Hemoglobin: 13 g/dL (ref 13.0–18.0)
MCH: 27 pg (ref 26.0–34.0)
MCHC: 33.2 g/dL (ref 32.0–36.0)
MCV: 81.4 fL (ref 80.0–100.0)
Platelets: 209 10*3/uL (ref 150–440)
RBC: 4.83 MIL/uL (ref 4.40–5.90)
RDW: 16.8 % — ABNORMAL HIGH (ref 11.5–14.5)
WBC: 12 10*3/uL — AB (ref 3.8–10.6)

## 2018-05-17 LAB — GLUCOSE, CAPILLARY
GLUCOSE-CAPILLARY: 89 mg/dL (ref 70–99)
Glucose-Capillary: 123 mg/dL — ABNORMAL HIGH (ref 70–99)
Glucose-Capillary: 135 mg/dL — ABNORMAL HIGH (ref 70–99)

## 2018-05-17 LAB — BASIC METABOLIC PANEL
Anion gap: 6 (ref 5–15)
BUN: 20 mg/dL (ref 8–23)
CHLORIDE: 105 mmol/L (ref 98–111)
CO2: 26 mmol/L (ref 22–32)
Calcium: 8.3 mg/dL — ABNORMAL LOW (ref 8.9–10.3)
Creatinine, Ser: 0.91 mg/dL (ref 0.61–1.24)
GFR calc non Af Amer: >60 mL/min (ref >60)
Glucose, Bld: 138 mg/dL — ABNORMAL HIGH (ref 70–99)
POTASSIUM: 4.2 mmol/L (ref 3.5–5.1)
SODIUM: 137 mmol/L (ref 135–145)

## 2018-05-17 LAB — TROPONIN I: Troponin I: <0.03 ng/mL (ref <0.03)

## 2018-05-17 LAB — ECHOCARDIOGRAM COMPLETE
Height: 71 in
Weight: 3612.02 oz

## 2018-05-17 MED ORDER — GLIMEPIRIDE 2 MG PO TABS
2.0000 mg | ORAL_TABLET | Freq: Every day | ORAL | Status: DC
  Administered 2018-05-17: 2 mg via ORAL
  Filled 2018-05-17 (×2): qty 1

## 2018-05-17 MED ORDER — ASPIRIN EC 81 MG PO TBEC
81.0000 mg | DELAYED_RELEASE_TABLET | Freq: Every day | ORAL | Status: DC
  Administered 2018-05-18 – 2018-05-20 (×3): 81 mg via ORAL
  Filled 2018-05-17 (×3): qty 1

## 2018-05-17 MED ORDER — ARFORMOTEROL TARTRATE 15 MCG/2ML IN NEBU
15.0000 ug | INHALATION_SOLUTION | Freq: Two times a day (BID) | RESPIRATORY_TRACT | Status: DC
  Administered 2018-05-18 (×2): 15 ug via RESPIRATORY_TRACT
  Filled 2018-05-17 (×3): qty 2

## 2018-05-17 MED ORDER — INSULIN ASPART 100 UNIT/ML ~~LOC~~ SOLN
0.0000 [IU] | Freq: Three times a day (TID) | SUBCUTANEOUS | Status: DC
  Filled 2018-05-17 (×2): qty 1

## 2018-05-17 MED ORDER — UMECLIDINIUM BROMIDE 62.5 MCG/INH IN AEPB
1.0000 | INHALATION_SPRAY | Freq: Every day | RESPIRATORY_TRACT | Status: DC
  Administered 2018-05-17: 1 via RESPIRATORY_TRACT
  Filled 2018-05-17: qty 7

## 2018-05-17 MED ORDER — FLUTICASONE FUROATE-VILANTEROL 100-25 MCG/INH IN AEPB
1.0000 | INHALATION_SPRAY | Freq: Every day | RESPIRATORY_TRACT | Status: DC
  Administered 2018-05-17: 1 via RESPIRATORY_TRACT
  Filled 2018-05-17: qty 28

## 2018-05-17 MED ORDER — BUDESONIDE 0.25 MG/2ML IN SUSP
0.2500 mg | Freq: Two times a day (BID) | RESPIRATORY_TRACT | Status: DC
  Administered 2018-05-18 (×2): 0.25 mg via RESPIRATORY_TRACT
  Filled 2018-05-17 (×2): qty 2

## 2018-05-17 MED ORDER — IPRATROPIUM BROMIDE 0.02 % IN SOLN
0.5000 mg | Freq: Four times a day (QID) | RESPIRATORY_TRACT | Status: DC
  Administered 2018-05-18 (×3): 0.5 mg via RESPIRATORY_TRACT
  Filled 2018-05-17 (×3): qty 2.5
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
## 2018-05-17 NOTE — Progress Notes (Signed)
Spoke with Dr. Vianne Bulls to notify her that patient keeps bradying down to the mid to low 40s. MD to stop the patient's Metoprolol.
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
## 2018-05-17 NOTE — Progress Notes (Signed)
*  PRELIMINARY RESULTS* Echocardiogram 2D Echocardiogram has been performed.  Jerry Jimenez 05/17/2018, 11:12 AM
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
## 2018-05-18 DIAGNOSIS — I272 Pulmonary hypertension, unspecified: Secondary | ICD-10-CM

## 2018-05-18 DIAGNOSIS — Z87891 Personal history of nicotine dependence: Secondary | ICD-10-CM

## 2018-05-18 DIAGNOSIS — C3411 Malignant neoplasm of upper lobe, right bronchus or lung: Secondary | ICD-10-CM

## 2018-05-18 DIAGNOSIS — R918 Other nonspecific abnormal finding of lung field: Secondary | ICD-10-CM

## 2018-05-18 LAB — BASIC METABOLIC PANEL
ANION GAP: 10 (ref 5–15)
BUN: 16 mg/dL (ref 8–23)
CALCIUM: 8.8 mg/dL — AB (ref 8.9–10.3)
CO2: 25 mmol/L (ref 22–32)
Chloride: 99 mmol/L (ref 98–111)
Creatinine, Ser: 0.8 mg/dL (ref 0.61–1.24)
Glucose, Bld: 151 mg/dL — ABNORMAL HIGH (ref 70–99)
Potassium: 4.6 mmol/L (ref 3.5–5.1)
SODIUM: 134 mmol/L — AB (ref 135–145)

## 2018-05-18 LAB — GLUCOSE, CAPILLARY
GLUCOSE-CAPILLARY: 148 mg/dL — AB (ref 70–99)
GLUCOSE-CAPILLARY: 117 mg/dL — AB (ref 70–99)
GLUCOSE-CAPILLARY: 124 mg/dL — AB (ref 70–99)
GLUCOSE-CAPILLARY: 189 mg/dL — AB (ref 70–99)

## 2018-05-18 MED ORDER — FUROSEMIDE 40 MG PO TABS
40.0000 mg | ORAL_TABLET | Freq: Two times a day (BID) | ORAL | Status: DC
  Administered 2018-05-18 – 2018-05-20 (×4): 40 mg via ORAL
  Filled 2018-05-18 (×4): qty 1

## 2018-05-18 MED ORDER — PANTOPRAZOLE SODIUM 40 MG PO TBEC
40.0000 mg | DELAYED_RELEASE_TABLET | Freq: Every day | ORAL | Status: DC
  Administered 2018-05-18 – 2018-05-20 (×3): 40 mg via ORAL
  Filled 2018-05-18 (×3): qty 1

## 2018-05-18 MED ORDER — GLIMEPIRIDE 2 MG PO TABS
2.0000 mg | ORAL_TABLET | Freq: Every day | ORAL | Status: DC
  Administered 2018-05-18 – 2018-05-20 (×3): 2 mg via ORAL
  Filled 2018-05-18 (×3): qty 1

## 2018-05-18 MED ORDER — FLUTICASONE PROPIONATE 50 MCG/ACT NA SUSP
1.0000 | Freq: Every day | NASAL | Status: DC
  Administered 2018-05-18 – 2018-05-20 (×3): 1 via NASAL
  Filled 2018-05-18: qty 16
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
## 2018-05-19 LAB — GLUCOSE, CAPILLARY
GLUCOSE-CAPILLARY: 120 mg/dL — AB (ref 70–99)
GLUCOSE-CAPILLARY: 154 mg/dL — AB (ref 70–99)
GLUCOSE-CAPILLARY: 171 mg/dL — AB (ref 70–99)
GLUCOSE-CAPILLARY: 149 mg/dL — AB (ref 70–99)

## 2018-05-19 LAB — BASIC METABOLIC PANEL
Anion gap: 11 (ref 5–15)
BUN: 19 mg/dL (ref 8–23)
CO2: 25 mmol/L (ref 22–32)
Calcium: 8.4 mg/dL — ABNORMAL LOW (ref 8.9–10.3)
Chloride: 100 mmol/L (ref 98–111)
Creatinine, Ser: 0.72 mg/dL (ref 0.61–1.24)
GFR calc Af Amer: >60 mL/min (ref >60)
GLUCOSE: 101 mg/dL — AB (ref 70–99)
Potassium: 4 mmol/L (ref 3.5–5.1)
Sodium: 136 mmol/L (ref 135–145)

## 2018-05-19 MED ORDER — BUDESONIDE 0.25 MG/2ML IN SUSP
0.2500 mg | Freq: Two times a day (BID) | RESPIRATORY_TRACT | Status: DC
  Administered 2018-05-19 – 2018-05-20 (×2): 0.25 mg via RESPIRATORY_TRACT
  Filled 2018-05-19 (×3): qty 2

## 2018-05-19 MED ORDER — POLYETHYLENE GLYCOL 3350 17 G PO PACK
17.0000 g | PACK | Freq: Every day | ORAL | Status: AC
  Administered 2018-05-19: 17 g via ORAL
  Filled 2018-05-19: qty 1

## 2018-05-19 MED ORDER — ARFORMOTEROL TARTRATE 15 MCG/2ML IN NEBU
15.0000 ug | INHALATION_SOLUTION | Freq: Two times a day (BID) | RESPIRATORY_TRACT | Status: DC
  Administered 2018-05-19 – 2018-05-20 (×2): 15 ug via RESPIRATORY_TRACT
  Filled 2018-05-19 (×4): qty 2

## 2018-05-19 MED ORDER — IPRATROPIUM BROMIDE 0.02 % IN SOLN
0.5000 mg | Freq: Two times a day (BID) | RESPIRATORY_TRACT | Status: DC
  Administered 2018-05-19 – 2018-05-20 (×2): 0.5 mg via RESPIRATORY_TRACT
  Filled 2018-05-19 (×3): qty 2.5

## 2018-05-19 MED ORDER — BISACODYL 10 MG RE SUPP: 10.0000 mg | Freq: Every day | RECTAL | Status: DC | PRN

## 2018-05-19 MED ORDER — SODIUM CHLORIDE 0.9 % IV SOLN
500.0000 mg | INTRAVENOUS | Status: DC
  Administered 2018-05-19: 500 mg via INTRAVENOUS
  Filled 2018-05-19 (×3): qty 500

## 2018-05-19 MED ORDER — SODIUM CHLORIDE 0.9 % IV SOLN
1.0000 g | INTRAVENOUS | Status: DC
  Administered 2018-05-19 – 2018-05-20 (×2): 1 g via INTRAVENOUS
  Filled 2018-05-19: qty 10
  Filled 2018-05-19: qty 1
  Filled 2018-05-19: qty 10
  Filled 2018-05-19: qty 1

## 2018-05-19 MED ORDER — LACTULOSE 10 GM/15ML PO SOLN
30.0000 g | Freq: Once | ORAL | Status: AC
  Administered 2018-05-20: 30 g via ORAL
  Filled 2018-05-19: qty 60

## 2018-05-19 MED ORDER — LACTULOSE 10 GM/15ML PO SOLN
30.0000 g | Freq: Two times a day (BID) | ORAL | Status: DC | PRN
  Administered 2018-05-19: 30 g via ORAL
  Filled 2018-05-19: qty 60

## 2018-05-19 MED ORDER — IOPAMIDOL (ISOVUE-300) INJECTION 61%
30.0000 mL | Freq: Once | INTRAVENOUS | Status: AC
  Administered 2018-05-19: 30 mL via ORAL

## 2018-05-19 NOTE — Progress Notes (Signed)
PRN Duoneb given for sob

## 2018-05-19 NOTE — Progress Notes (Signed)
Dr. Vianne Bulls to start IV antibiotics for productive cough. Will continue to montior.
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
## 2018-05-19 NOTE — Progress Notes (Signed)
Rept to Dr. Vianne Bulls. Pt BS faint this AM. Abdomen distended and tight. Pt is passing gas but having bouts of nausea relieved by Zofran. Pt repts LBM Monday 7/21. Orders received for Miralax and Lactulose. No results from AM PRN dulcolax tablet. Will rept to oncoming RN Luellen Pucker.

## 2018-05-19 NOTE — Care Management Important Message (Signed)
Important Message  Patient Details  Name: Jerry Jimenez MRN: [id number] Date of Birth: 05-28-1934   Medicare Important Message Given:  Yes    Juliann Pulse A Klay Sobotka 05/19/2018, 11:23 AM
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
## 2018-05-20 ENCOUNTER — Inpatient Hospital Stay: Payer: No Typology Code available for payment source

## 2018-05-20 ENCOUNTER — Encounter: Payer: Self-pay | Admitting: Radiology

## 2018-05-20 LAB — GLUCOSE, CAPILLARY
GLUCOSE-CAPILLARY: 142 mg/dL — AB (ref 70–99)
Glucose-Capillary: 111 mg/dL — ABNORMAL HIGH (ref 70–99)

## 2018-05-20 IMAGING — CT NM PET TUM IMG INITIAL (PI) SKULL BASE T - THIGH
8 of 9 series · 21 of 25 positions shown · non-contrast
Comparison: Chest CT 08/20/2017

ADDENDUM:
The original report was by Dr. Celill Jumayew. The following
addendum is by Dr. Celill Jumayew:

Note is also made of prior pelvic deformities likely related to old
fractures, as well as suspected bilateral pars defects at L5.
CLINICAL DATA: Initial treatment strategy for right upper lobe
pulmonary nodule..
EXAM:
NUCLEAR MEDICINE PET SKULL BASE TO THIGH
TECHNIQUE: 12.6 mCi F-18 FDG was injected intravenously. Full-ring PET imaging
was performed from the skull base to thigh after the radiotracer. CT
data was obtained and used for attenuation correction and anatomic
localization.
FASTING BLOOD GLUCOSE:  Value: 120 mg/dl

[Series 3: ct wb 5.0 b30f · axial · 5.0mm · 0.98mm/px · z∈[-1510,-856]mm · 3 of 329 slices shown]
[im 1/329]
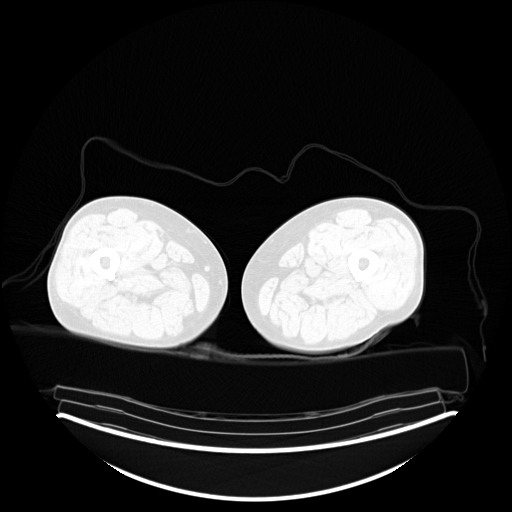
[im 110/329]
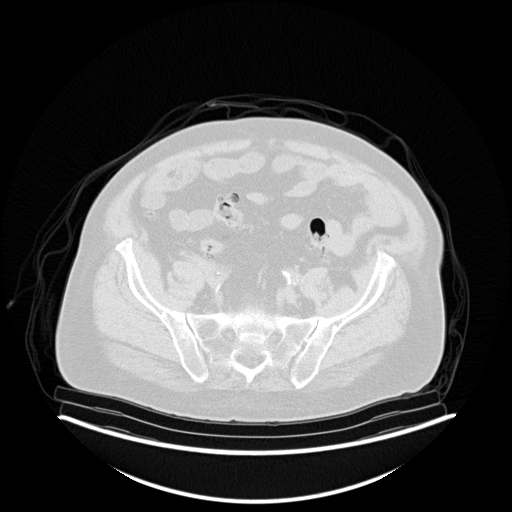
[im 219/329]
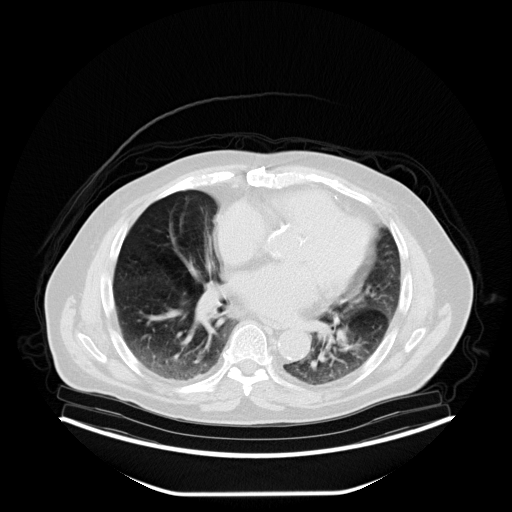

[Series 5: pet wb uncorrected (nac) · axial · 5.0mm · 4.07mm/px · z∈[-1510,-526]mm · 4 of 329 slices shown]
[im 1/329]
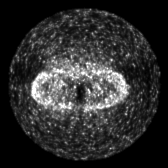
[im 110/329]
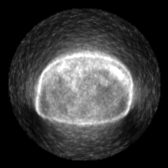
[im 219/329]
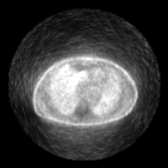
[im 329/329]
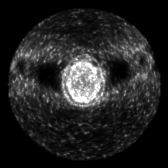

[Series 603: fused axial · 4 of 328 slices shown]
[im 1/328]
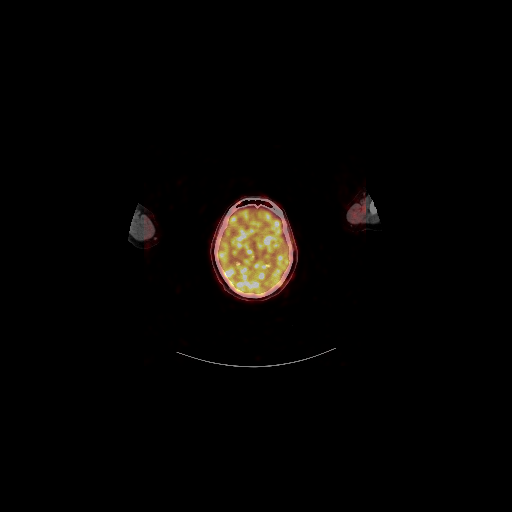
[im 164/328]
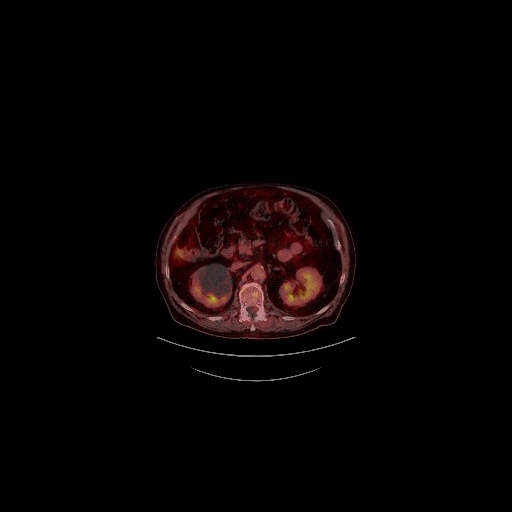
[im 246/328]
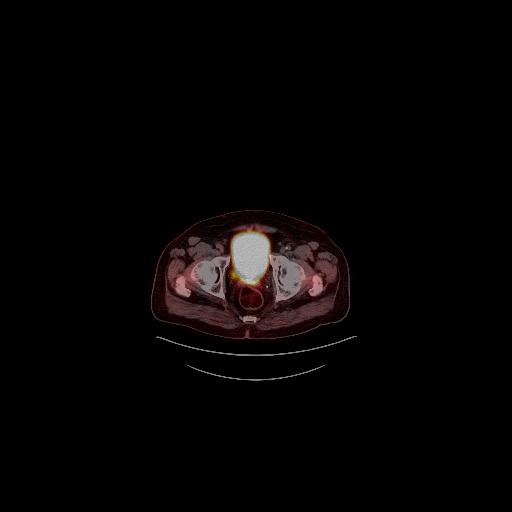
[im 328/328]
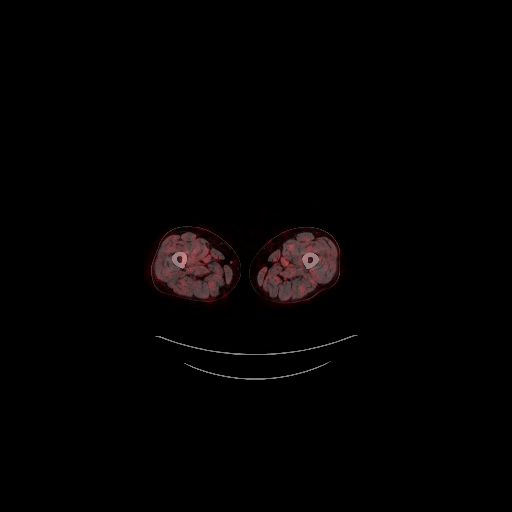

[Series 604: fused coronal · 1 of 70 slices shown]
[im 1/70]
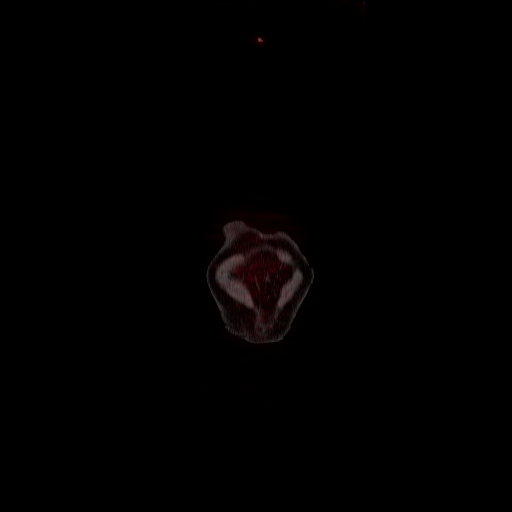

[Series 605: fused sagittal · 1 of 141 slices shown]
[im 1/141]
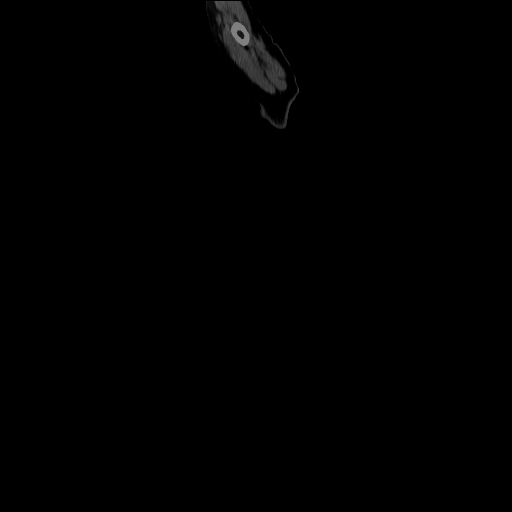

[Series 606: pet axial · 5 of 327 slices shown]
[im 1/327]
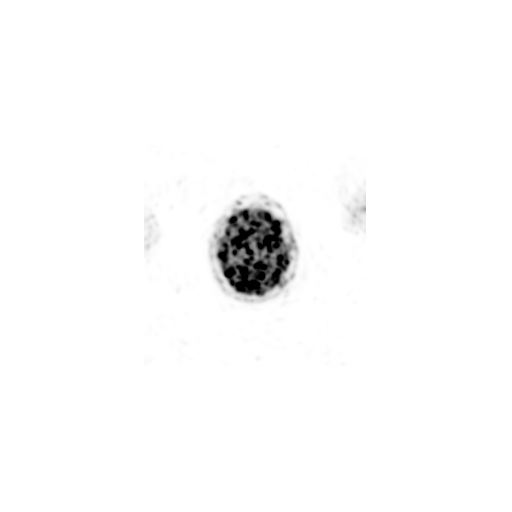
[im 82/327]
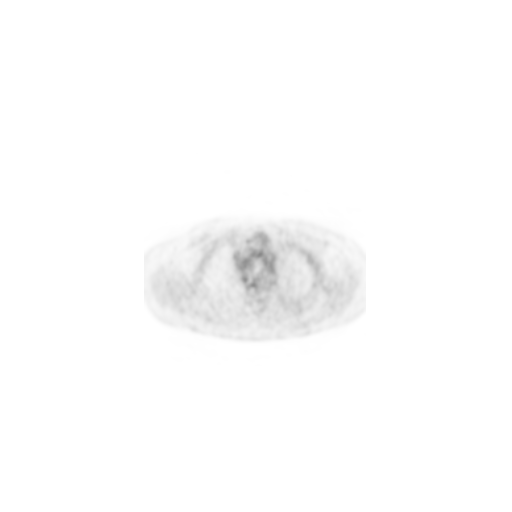
[im 164/327]
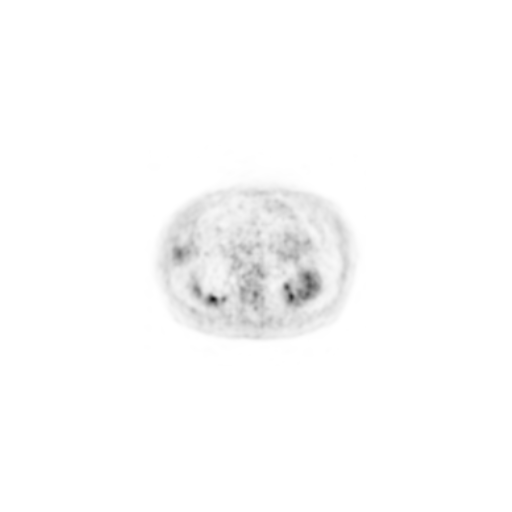
[im 245/327]
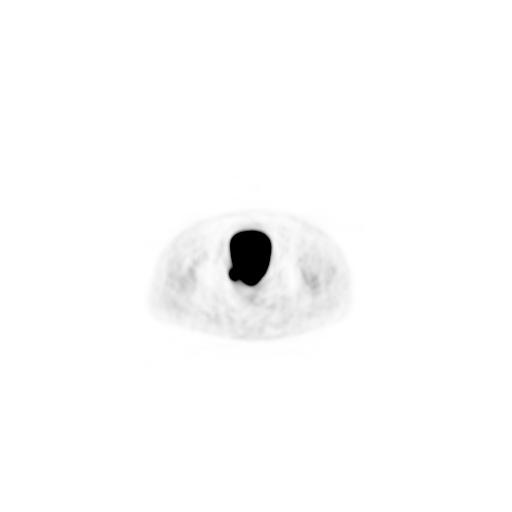
[im 327/327]
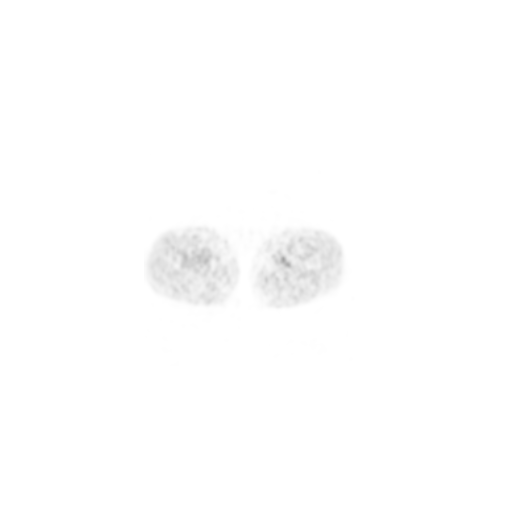

[Series 608: pet sagittal · 2 of 141 slices shown]
[im 1/141]
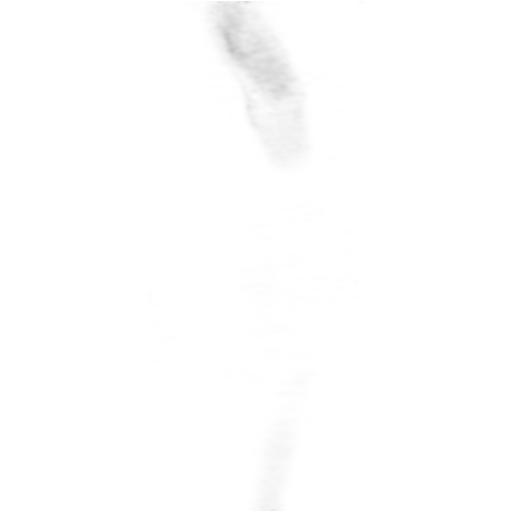
[im 141/141]
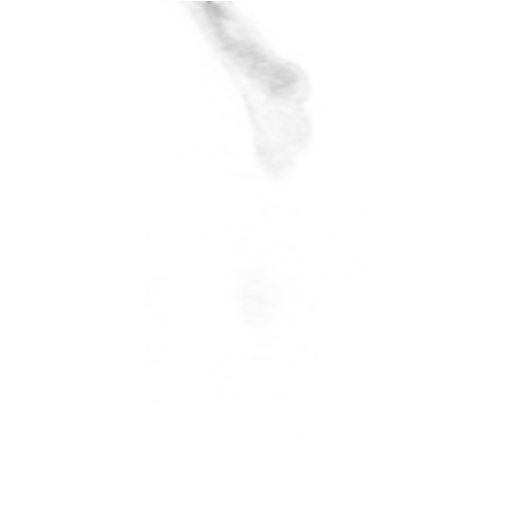

[Series 1031: results mm oncology reading · 5.0mm · 0.75mm/px · 1 of 2 slices shown]
[im 1/2]
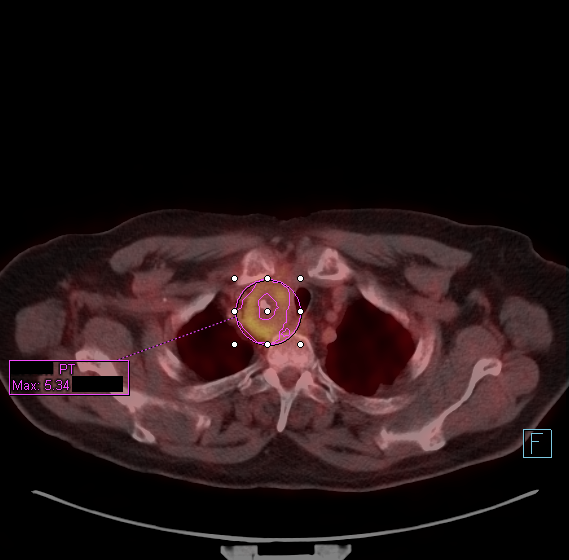

[21 of 25 positions shown; findings below may reference images not displayed]

FINDINGS: NECK

No hypermetabolic lymph nodes in the neck.

Carotid atherosclerotic calcifications noted.

CHEST

Primarily intrathoracic right thyroid nodule measuring about 4.6 by
3.5 cm with accentuated metabolic activity along its margins,
maximum SUV 5.3.

The primarily ground-glass bandlike in nodular density anteriorly in
the right upper lobe adjacent to the mediastinal margin has a
maximum SUV of 4.2.

No enlarged or hypermetabolic adenopathy in the chest.

Coronary, aortic arch, and branch vessel atherosclerotic vascular
disease. Mild cardiomegaly. Prior CABG. Centrilobular emphysema.

ABDOMEN/PELVIS

No abnormal hypermetabolic activity within the liver, pancreas,
adrenal glands, or spleen. No hypermetabolic lymph nodes in the
abdomen or pelvis.

Aortoiliac atherosclerotic vascular disease. Bilateral photopenic
renal cysts. The sigmoid colon diverticulosis without active
diverticulitis. Incidental small right posterior diverticulum of the
urinary bladder.

SKELETON

Low-grade metabolic activity associated with fractures of the lower
sternal manubrium; right anterior second, third, fourth, fifth,
sixth, seventh, eighth, and ninth ribs ; and subacute compression
fracture at the T12 vertebral body with moderate loss of vertebral
height. There also appear to be subtle healing fractures of the left
second, fourth, fifth, and sixth ribs. Older superior endplate
compression fractures at L1 and L2 demonstrate no accentuated
metabolic activity. Overall I feel that these fractures are benign
given the pattern and appearance on both the CT data and PET data.
No osseous findings characteristic for osseous metastatic disease.
IMPRESSION: 1. The partially sub solid nodular density in the right upper lobe
has a maximum SUV of 4.2, suspicious for low-grade malignancy. No
findings of metastatic disease.
2. Primarily intrathoracic right thyroid nodule measuring up to
cm with accentuated metabolic activity along its margins. A
significant minority of hypermetabolic thyroid nodules can be
malignant the, and thyroid ultrasound to assess feasibility of
biopsy should be considered.
3. Benign-appearing subacute fractures of multiple bilateral ribs,
lower sternal manubrium, and of the T12 vertebral body.
4. Other imaging findings of potential clinical significance: Aortic
Atherosclerosis (PNC51-1XI.I) and Emphysema ([id number]-D22.U). Bilateral
photopenic renal cysts. Sigmoid colon diverticulosis. Coronary
atherosclerosis with mild cardiomegaly and prior CABG.

## 2018-05-20 MED ORDER — IOPAMIDOL (ISOVUE-300) INJECTION 61%
100.0000 mL | Freq: Once | INTRAVENOUS | Status: AC | PRN
Start: 2018-05-20 — End: 2018-05-20
  Administered 2018-05-20: 100 mL via INTRAVENOUS

## 2018-05-20 MED ORDER — DOCUSATE SODIUM 100 MG PO CAPS: 100.0000 mg | ORAL_CAPSULE | Freq: Every day | ORAL | 0 refills | Status: AC

## 2018-05-20 MED ORDER — ACETAMINOPHEN 325 MG PO TABS: 650.0000 mg | ORAL_TABLET | Freq: Four times a day (QID) | ORAL | Status: AC | PRN

## 2018-05-20 MED ORDER — CEPHALEXIN 500 MG PO CAPS: 500.0000 mg | ORAL_CAPSULE | Freq: Three times a day (TID) | ORAL | 0 refills | Status: AC

## 2018-05-20 MED ORDER — HYDROCODONE-ACETAMINOPHEN 5-325 MG PO TABS: 1.0000 | ORAL_TABLET | Freq: Four times a day (QID) | ORAL | 0 refills | Status: AC | PRN

## 2018-05-20 MED ORDER — BISACODYL 10 MG RE SUPP
10.0000 mg | Freq: Every day | RECTAL | Status: DC | PRN
  Administered 2018-05-20: 10 mg via RECTAL
  Filled 2018-05-20: qty 1

## 2018-05-20 MED ORDER — FLEET ENEMA 7-19 GM/118ML RE ENEM: 1.0000 | ENEMA | Freq: Every day | RECTAL | Status: DC | PRN
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
## 2018-05-20 NOTE — Care Management (Signed)
Discharge to home today per Dr. Margaretmary Eddy. Will be followed by [facility] for services in the home [facility], [place] representative updated Shelbie Ammons RN MSN [place] [facility] [phone]

## 2018-05-20 NOTE — Progress Notes (Signed)
Pt discharged to home via wheelchair accompanied by wife per MD order without incident. Prior to discharge, pt had BM. Pt abdomen has decreased distention and softer. Prior to discharge pt oxygen saturation checked on room air setting on edge of bed. Pt oxygen saturation 97%. Prior to discharge, all discharge teachings done both written and verbal with patient and wife. Both verbalize understanding and agree to comply. No change in patient from AM assessment on discharge. Pt denies pain on discharge. Pt discharged with prescriptions for vicodin and keflex.

## 2018-12-13 ENCOUNTER — Other Ambulatory Visit (HOSPITAL_COMMUNITY): Payer: Self-pay | Admitting: Pulmonary Disease

## 2018-12-13 ENCOUNTER — Encounter (INDEPENDENT_AMBULATORY_CARE_PROVIDER_SITE_OTHER): Payer: Self-pay

## 2018-12-13 ENCOUNTER — Ambulatory Visit
Admission: RE | Admit: 2018-12-13 | Discharge: 2018-12-13 | Disposition: A | Discharge status: Home / Self Care | Payer: Medicare Other | Source: Ambulatory Visit | Attending: Pulmonary Disease | Admitting: Pulmonary Disease

## 2018-12-13 ENCOUNTER — Other Ambulatory Visit: Payer: Self-pay | Admitting: Pulmonary Disease

## 2018-12-13 DIAGNOSIS — I82462 Acute embolism and thrombosis of left calf muscular vein: Secondary | ICD-10-CM

## 2018-12-13 DIAGNOSIS — I482 Chronic atrial fibrillation, unspecified: Secondary | ICD-10-CM | POA: Diagnosis present

## 2019-02-27 ENCOUNTER — Other Ambulatory Visit: Payer: Self-pay | Admitting: Otolaryngology

## 2019-02-27 ENCOUNTER — Other Ambulatory Visit (HOSPITAL_COMMUNITY): Payer: Self-pay | Admitting: Otolaryngology

## 2019-02-27 DIAGNOSIS — R1310 Dysphagia, unspecified: Secondary | ICD-10-CM

## 2019-03-07 ENCOUNTER — Other Ambulatory Visit: Payer: Self-pay | Admitting: Otolaryngology

## 2019-03-07 DIAGNOSIS — R1312 Dysphagia, oropharyngeal phase: Secondary | ICD-10-CM

## 2019-03-16 ENCOUNTER — Telehealth: Payer: Self-pay | Admitting: Nurse Practitioner

## 2019-03-16 NOTE — Telephone Encounter (Signed)
Spoke with Jerry Jimenez (wife) about scheduling Palliative Consult, she said she needed to discuss it with patient first before scheduling it.  She took my name and number and will call me back to let me know what patient decided to do.

## 2019-03-23 ENCOUNTER — Telehealth: Payer: Self-pay | Admitting: Nurse Practitioner

## 2019-03-23 NOTE — Telephone Encounter (Signed)
[place] with wife Charlett Nose to follow-up and see if patient wanted to schedule the Palliative Consult, wife stated that he has declined services at this time.  I explained to her that we would cancel the referral and I would notify his MD of his decision.  I explained to her that if he decides he wants Palliative services later on to contact the MD office, she was in agreement with this.

## 2019-03-28 ENCOUNTER — Ambulatory Visit
Admission: RE | Admit: 2019-03-28 | Discharge: 2019-03-28 | Disposition: A | Discharge status: Home / Self Care | Payer: Medicare Other | Source: Ambulatory Visit | Attending: Otolaryngology | Admitting: Otolaryngology

## 2019-03-28 ENCOUNTER — Other Ambulatory Visit: Payer: Self-pay

## 2019-03-28 DIAGNOSIS — R1310 Dysphagia, unspecified: Secondary | ICD-10-CM | POA: Insufficient documentation

## 2019-04-21 ENCOUNTER — Ambulatory Visit: Payer: No Typology Code available for payment source

## 2019-08-28 ENCOUNTER — Ambulatory Visit
Admission: RE | Admit: 2019-08-28 | Discharge: 2019-08-28 | Disposition: A | Discharge status: Home / Self Care | Payer: Medicare Other | Source: Ambulatory Visit | Attending: Internal Medicine | Admitting: Internal Medicine

## 2019-08-28 ENCOUNTER — Other Ambulatory Visit: Payer: Self-pay

## 2019-08-28 ENCOUNTER — Other Ambulatory Visit: Payer: Self-pay | Admitting: Internal Medicine

## 2019-08-28 DIAGNOSIS — C7931 Secondary malignant neoplasm of brain: Secondary | ICD-10-CM

## 2019-08-28 MED ORDER — IOHEXOL 300 MG/ML  SOLN
75.0000 mL | Freq: Once | INTRAMUSCULAR | Status: AC | PRN
  Administered 2019-08-28: 11:00:00 75 mL via INTRAVENOUS

## 2019-12-16 ENCOUNTER — Ambulatory Visit: Payer: Medicare Other | Attending: Internal Medicine

## 2019-12-16 DIAGNOSIS — Z23 Encounter for immunization: Secondary | ICD-10-CM | POA: Insufficient documentation

## 2019-12-16 NOTE — Progress Notes (Signed)
   Covid-19 Vaccination Clinic  Name:  Jerry Jimenez    MRN: [id number] DOB: Jun 27, 1934  12/16/2019  [name] was observed post Covid-19 immunization for 15 minutes without incidence. He was provided with Vaccine Information Sheet and instruction to access the V-Safe system.   [name] was instructed to call 911 with any severe reactions post vaccine: Marland Kitchen Difficulty breathing  . Swelling of your face and throat  . A fast heartbeat  . A bad rash all over your body  . Dizziness and weakness    Immunizations Administered    Name Date Dose VIS Date Route   Pfizer COVID-19 Vaccine 12/16/2019  2:34 PM 0.3 mL 10/06/2019 Intramuscular   Manufacturer: [place]   Lot: [id number]   [place]: [id number]

## 2020-01-09 ENCOUNTER — Ambulatory Visit: Payer: Medicare Other | Attending: Internal Medicine

## 2020-01-09 DIAGNOSIS — Z23 Encounter for immunization: Secondary | ICD-10-CM

## 2020-01-09 NOTE — Progress Notes (Signed)
   Covid-19 Vaccination Clinic  Name:  Jerry Jimenez    MRN: [id number] DOB: 22-Nov-1933  01/09/2020  Jerry Jimenez was observed post Covid-19 immunization for 15 minutes without incident. He was provided with Vaccine Information Sheet and instruction to access the V-Safe system.   Jerry Jimenez was instructed to call 911 with any severe reactions post vaccine: Marland Kitchen Difficulty breathing  . Swelling of face and throat  . A fast heartbeat  . A bad rash all over body  . Dizziness and weakness   Immunizations Administered    Name Date Dose VIS Date Route   Pfizer COVID-19 Vaccine 01/09/2020  1:58 PM 0.3 mL 10/06/2019 Intramuscular   Manufacturer: [place]   Lot: [id number]   [place]: [id number]

## 2021-11-07 ENCOUNTER — Other Ambulatory Visit: Payer: Self-pay | Admitting: Internal Medicine

## 2021-11-07 DIAGNOSIS — M4856XG Collapsed vertebra, not elsewhere classified, lumbar region, subsequent encounter for fracture with delayed healing: Secondary | ICD-10-CM

## 2021-11-09 ENCOUNTER — Ambulatory Visit
Admission: RE | Admit: 2021-11-09 | Discharge: 2021-11-09 | Disposition: A | Discharge status: Home / Self Care | Payer: Medicare Other | Source: Ambulatory Visit | Attending: Internal Medicine | Admitting: Internal Medicine

## 2021-11-09 ENCOUNTER — Other Ambulatory Visit: Payer: Self-pay | Admitting: Internal Medicine

## 2021-11-09 DIAGNOSIS — M4856XG Collapsed vertebra, not elsewhere classified, lumbar region, subsequent encounter for fracture with delayed healing: Secondary | ICD-10-CM | POA: Diagnosis present

## 2021-11-09 MED ORDER — GADOBUTROL 1 MMOL/ML IV SOLN
10.0000 mL | Freq: Once | INTRAVENOUS | Status: AC | PRN
  Administered 2021-11-09: 10 mL via INTRAVENOUS

## 2022-02-09 ENCOUNTER — Emergency Department: Payer: Medicare Other

## 2022-02-09 ENCOUNTER — Inpatient Hospital Stay: Admit: 2022-02-09 | Payer: Medicare Other

## 2022-02-09 DIAGNOSIS — I469 Cardiac arrest, cause unspecified: Principal | ICD-10-CM

## 2022-02-09 DIAGNOSIS — J9602 Acute respiratory failure with hypercapnia: Secondary | ICD-10-CM | POA: Diagnosis present

## 2022-02-09 DIAGNOSIS — I462 Cardiac arrest due to underlying cardiac condition: Secondary | ICD-10-CM | POA: Diagnosis present

## 2022-02-09 DIAGNOSIS — Z7951 Long term (current) use of inhaled steroids: Secondary | ICD-10-CM | POA: Diagnosis not present

## 2022-02-09 DIAGNOSIS — Z66 Do not resuscitate: Secondary | ICD-10-CM | POA: Diagnosis present

## 2022-02-09 DIAGNOSIS — J449 Chronic obstructive pulmonary disease, unspecified: Secondary | ICD-10-CM | POA: Diagnosis present

## 2022-02-09 DIAGNOSIS — I110 Hypertensive heart disease with heart failure: Secondary | ICD-10-CM | POA: Diagnosis present

## 2022-02-09 DIAGNOSIS — N179 Acute kidney failure, unspecified: Secondary | ICD-10-CM | POA: Diagnosis present

## 2022-02-09 DIAGNOSIS — R570 Cardiogenic shock: Secondary | ICD-10-CM | POA: Diagnosis present

## 2022-02-09 DIAGNOSIS — Z7901 Long term (current) use of anticoagulants: Secondary | ICD-10-CM

## 2022-02-09 DIAGNOSIS — Z8249 Family history of ischemic heart disease and other diseases of the circulatory system: Secondary | ICD-10-CM

## 2022-02-09 DIAGNOSIS — Z951 Presence of aortocoronary bypass graft: Secondary | ICD-10-CM

## 2022-02-09 DIAGNOSIS — Z7982 Long term (current) use of aspirin: Secondary | ICD-10-CM | POA: Diagnosis not present

## 2022-02-09 DIAGNOSIS — Z87891 Personal history of nicotine dependence: Secondary | ICD-10-CM

## 2022-02-09 DIAGNOSIS — I255 Ischemic cardiomyopathy: Secondary | ICD-10-CM | POA: Diagnosis present

## 2022-02-09 DIAGNOSIS — I2510 Atherosclerotic heart disease of native coronary artery without angina pectoris: Secondary | ICD-10-CM | POA: Diagnosis present

## 2022-02-09 DIAGNOSIS — E872 Acidosis, unspecified: Secondary | ICD-10-CM | POA: Diagnosis present

## 2022-02-09 DIAGNOSIS — G939 Disorder of brain, unspecified: Secondary | ICD-10-CM

## 2022-02-09 DIAGNOSIS — Z20822 Contact with and (suspected) exposure to covid-19: Secondary | ICD-10-CM | POA: Diagnosis present

## 2022-02-09 DIAGNOSIS — C3491 Malignant neoplasm of unspecified part of right bronchus or lung: Secondary | ICD-10-CM | POA: Diagnosis present

## 2022-02-09 DIAGNOSIS — I214 Non-ST elevation (NSTEMI) myocardial infarction: Secondary | ICD-10-CM | POA: Diagnosis present

## 2022-02-09 DIAGNOSIS — D72828 Other elevated white blood cell count: Secondary | ICD-10-CM | POA: Diagnosis present

## 2022-02-09 DIAGNOSIS — G928 Other toxic encephalopathy: Secondary | ICD-10-CM | POA: Diagnosis present

## 2022-02-09 DIAGNOSIS — Z79899 Other long term (current) drug therapy: Secondary | ICD-10-CM

## 2022-02-09 DIAGNOSIS — Z825 Family history of asthma and other chronic lower respiratory diseases: Secondary | ICD-10-CM

## 2022-02-09 DIAGNOSIS — Z7984 Long term (current) use of oral hypoglycemic drugs: Secondary | ICD-10-CM

## 2022-02-09 DIAGNOSIS — J9601 Acute respiratory failure with hypoxia: Secondary | ICD-10-CM | POA: Diagnosis present

## 2022-02-09 DIAGNOSIS — I5041 Acute combined systolic (congestive) and diastolic (congestive) heart failure: Secondary | ICD-10-CM | POA: Diagnosis present

## 2022-02-09 LAB — CBC WITH DIFFERENTIAL/PLATELET
Abs Immature Granulocytes: 1.75 10*3/uL — ABNORMAL HIGH (ref 0.00–0.07)
Basophils Absolute: 0.1 10*3/uL (ref 0.0–0.1)
Basophils Relative: 1 %
Eosinophils Absolute: 0.2 10*3/uL (ref 0.0–0.5)
Eosinophils Relative: 2 %
HCT: 44.7 % (ref 39.0–52.0)
Hemoglobin: 12.8 g/dL — ABNORMAL LOW (ref 13.0–17.0)
Immature Granulocytes: 12 %
Lymphocytes Relative: 26 %
Lymphs Abs: 3.8 10*3/uL (ref 0.7–4.0)
MCH: 25 pg — ABNORMAL LOW (ref 26.0–34.0)
MCHC: 28.6 g/dL — ABNORMAL LOW (ref 30.0–36.0)
MCV: 87.3 fL (ref 80.0–100.0)
Monocytes Absolute: 1.1 10*3/uL — ABNORMAL HIGH (ref 0.1–1.0)
Monocytes Relative: 8 %
Neutro Abs: 7.4 10*3/uL (ref 1.7–7.7)
Neutrophils Relative %: 51 %
Platelets: 191 10*3/uL (ref 150–400)
RBC: 5.12 MIL/uL (ref 4.22–5.81)
RDW: 21.8 % — ABNORMAL HIGH (ref 11.5–15.5)
Smear Review: NORMAL
WBC: 14.4 10*3/uL — ABNORMAL HIGH (ref 4.0–10.5)
nRBC: 1.3 % — ABNORMAL HIGH (ref 0.0–0.2)

## 2022-02-09 LAB — COMPREHENSIVE METABOLIC PANEL
ALT: 25 U/L (ref 0–44)
AST: 52 U/L — ABNORMAL HIGH (ref 15–41)
Albumin: 2.5 g/dL — ABNORMAL LOW (ref 3.5–5.0)
Alkaline Phosphatase: 96 U/L (ref 38–126)
Anion gap: 14 (ref 5–15)
BUN: 18 mg/dL (ref 8–23)
CO2: 21 mmol/L — ABNORMAL LOW (ref 22–32)
Calcium: 8.4 mg/dL — ABNORMAL LOW (ref 8.9–10.3)
Chloride: 97 mmol/L — ABNORMAL LOW (ref 98–111)
Creatinine, Ser: 1.45 mg/dL — ABNORMAL HIGH (ref 0.61–1.24)
GFR, Estimated: 47 mL/min — ABNORMAL LOW (ref >60)
Glucose, Bld: 300 mg/dL — ABNORMAL HIGH (ref 70–99)
Potassium: 4.9 mmol/L (ref 3.5–5.1)
Sodium: 132 mmol/L — ABNORMAL LOW (ref 135–145)
Total Bilirubin: 0.9 mg/dL (ref 0.3–1.2)
Total Protein: 6.1 g/dL — ABNORMAL LOW (ref 6.5–8.1)

## 2022-02-09 LAB — BLOOD GAS, VENOUS
Acid-base deficit: 15.1 mmol/L — ABNORMAL HIGH (ref 0.0–2.0)
Bicarbonate: 19.4 mmol/L — ABNORMAL LOW (ref 20.0–28.0)
Patient temperature: 37
pCO2, Ven: 97 mmHg (ref 44–60)
pH, Ven: <6.95 — CL (ref 7.25–7.43)

## 2022-02-09 LAB — PROCALCITONIN: Procalcitonin: 0.18 ng/mL

## 2022-02-09 LAB — RESP PANEL BY RT-PCR (FLU A&B, COVID) ARPGX2
Influenza A by PCR: NEGATIVE
Influenza B by PCR: NEGATIVE
SARS Coronavirus 2 by RT PCR: NEGATIVE

## 2022-02-09 LAB — PROTIME-INR
INR: 2 — ABNORMAL HIGH (ref 0.8–1.2)
Prothrombin Time: 22.6 seconds — ABNORMAL HIGH (ref 11.4–15.2)

## 2022-02-09 LAB — CBG MONITORING, ED: Glucose-Capillary: 96 mg/dL (ref 70–99)

## 2022-02-09 LAB — TROPONIN I (HIGH SENSITIVITY): Troponin I (High Sensitivity): 295 ng/L (ref <18)

## 2022-02-09 LAB — D-DIMER, QUANTITATIVE: D-Dimer, Quant: 16.68 ug/mL-FEU — ABNORMAL HIGH (ref 0.00–0.50)

## 2022-02-09 LAB — MAGNESIUM: Magnesium: 4 mg/dL — ABNORMAL HIGH (ref 1.7–2.4)

## 2022-02-09 MED ORDER — CALCIUM CHLORIDE 10 % IV SOLN
INTRAVENOUS | Status: AC | PRN
  Administered 2022-02-09 (×2): 1 g via INTRAVENOUS

## 2022-02-09 MED ORDER — MIDAZOLAM-SODIUM CHLORIDE 100-0.9 MG/100ML-% IV SOLN
INTRAVENOUS | Status: AC
  Filled 2022-02-09: qty 100

## 2022-02-09 MED ORDER — FENTANYL 2500MCG IN NS 250ML (10MCG/ML) PREMIX INFUSION
25.0000 ug/h | INTRAVENOUS | Status: DC
  Administered 2022-02-09: 25 ug/h via INTRAVENOUS
  Filled 2022-02-09: qty 250

## 2022-02-09 MED ORDER — DOCUSATE SODIUM 50 MG/5ML PO LIQD
100.0000 mg | Freq: Two times a day (BID) | ORAL | Status: DC
  Filled 2022-02-09: qty 10

## 2022-02-09 MED ORDER — CALCIUM CHLORIDE 10 % IV SOLN
INTRAVENOUS | Status: DC
Start: 2022-02-09 — End: 2022-02-09
  Filled 2022-02-09: qty 10

## 2022-02-09 MED ORDER — POLYETHYLENE GLYCOL 3350 17 G PO PACK: 17.0000 g | PACK | Freq: Every day | ORAL | Status: DC

## 2022-02-09 MED ORDER — FENTANYL BOLUS VIA INFUSION
25.0000 ug | INTRAVENOUS | Status: DC | PRN
  Administered 2022-02-09: 50 ug via INTRAVENOUS
  Filled 2022-02-09: qty 100

## 2022-02-09 MED ORDER — NOREPINEPHRINE 4 MG/250ML-% IV SOLN: 0.0000 ug/min | INTRAVENOUS | Status: DC

## 2022-02-09 MED ORDER — SODIUM BICARBONATE 8.4 % IV SOLN
INTRAVENOUS | Status: DC
  Filled 2022-02-09: qty 1000

## 2022-02-09 MED ORDER — PANTOPRAZOLE SODIUM 40 MG IV SOLR: 40.0000 mg | Freq: Every day | INTRAVENOUS | Status: DC

## 2022-02-09 MED ORDER — SODIUM BICARBONATE 8.4 % IV SOLN
INTRAVENOUS | Status: AC | PRN
  Administered 2022-02-09 (×3): 50 meq via INTRAVENOUS

## 2022-02-09 MED ORDER — FUROSEMIDE 10 MG/ML IJ SOLN
40.0000 mg | Freq: Once | INTRAMUSCULAR | Status: AC
  Administered 2022-02-09: 40 mg via INTRAVENOUS
  Filled 2022-02-09: qty 4

## 2022-02-09 MED ORDER — MIDAZOLAM-SODIUM CHLORIDE 100-0.9 MG/100ML-% IV SOLN: 0.0000 mg/h | INTRAVENOUS | Status: DC

## 2022-02-09 MED ORDER — EPINEPHRINE 1 MG/10ML IJ SOSY
PREFILLED_SYRINGE | INTRAMUSCULAR | Status: AC | PRN
  Administered 2022-02-09 (×7): 1 mg via INTRAVENOUS

## 2022-02-09 MED ORDER — EPINEPHRINE 1 MG/10ML IJ SOSY
PREFILLED_SYRINGE | INTRAMUSCULAR | Status: AC
  Filled 2022-02-09: qty 50

## 2022-02-09 MED ORDER — FENTANYL CITRATE PF 50 MCG/ML IJ SOSY
25.0000 ug | PREFILLED_SYRINGE | Freq: Once | INTRAMUSCULAR | Status: AC
  Administered 2022-02-09: 25 ug via INTRAVENOUS

## 2022-02-09 MED ORDER — NOREPINEPHRINE 4 MG/250ML-% IV SOLN
INTRAVENOUS | Status: AC
  Administered 2022-02-09: 20 ug/min via INTRAVENOUS
  Filled 2022-02-09: qty 250

## 2022-02-09 MED ORDER — DOCUSATE SODIUM 100 MG PO CAPS: 100.0000 mg | ORAL_CAPSULE | Freq: Two times a day (BID) | ORAL | Status: DC | PRN

## 2022-02-09 MED ORDER — PANTOPRAZOLE 2 MG/ML SUSPENSION: 40.0000 mg | Freq: Every day | ORAL | Status: DC

## 2022-02-09 MED ORDER — SODIUM CHLORIDE 0.9 % IV BOLUS
1000.0000 mL | Freq: Once | INTRAVENOUS | Status: DC
Start: 2022-02-09 — End: 2022-02-09

## 2022-02-09 MED ORDER — POLYETHYLENE GLYCOL 3350 17 G PO PACK: 17.0000 g | PACK | Freq: Every day | ORAL | Status: DC | PRN

## 2022-02-09 MED ORDER — EPINEPHRINE HCL 5 MG/250ML IV SOLN IN NS
0.5000 ug/min | INTRAVENOUS | Status: DC
  Administered 2022-02-09: 20 ug/min via INTRAVENOUS
  Filled 2022-02-09: qty 250

## 2022-02-09 MED ORDER — NOREPINEPHRINE 4 MG/250ML-% IV SOLN: 2.0000 ug/min | INTRAVENOUS | Status: DC

## 2022-02-09 MED ORDER — EPINEPHRINE 1 MG/10ML IJ SOSY
PREFILLED_SYRINGE | INTRAMUSCULAR | Status: AC | PRN
  Administered 2022-02-09: 1 mg via INTRAVENOUS

## 2022-02-09 MED ORDER — CALCIUM GLUCONATE 10 % IV SOLN
INTRAVENOUS | Status: DC
Start: 2022-02-09 — End: 2022-02-09
  Filled 2022-02-09: qty 10

## 2022-02-09 MED ORDER — SODIUM CHLORIDE 0.9 % IV SOLN: 250.0000 mL | INTRAVENOUS | Status: DC

## 2022-02-09 MED ORDER — MIDAZOLAM BOLUS VIA INFUSION
0.0000 mg | INTRAVENOUS | Status: DC | PRN
  Filled 2022-02-09: qty 5

## 2022-02-09 MED ORDER — SODIUM CHLORIDE 0.9 % IV SOLN
INTRAVENOUS | Status: AC | PRN
  Administered 2022-02-09 (×2): 1000 mL via INTRAVENOUS

## 2022-02-23 NOTE — ED Notes (Signed)
Dr. Mortimer Fries at bedside at this time. Per Dr. Ellender Hose, no CPR from this point forward. ?

## 2022-02-23 NOTE — Code Documentation (Signed)
Epi gtt started to RAC at 10 mcg/min per Dr. Ellender Hose.  ?

## 2022-02-23 NOTE — H&P (Signed)
? ?NAME:  Jerry Jimenez, MRN:  [id number], DOB:  1933-12-05, LOS: 0 ?ADMISSION DATE:  03/06/22 ? ?CHIEF COMPLAINT:  cardiac arrest ? ?BRIEF SYNOPSIS ?88 yo white male with multiple co morbidities presented to ER with acute cardiac arrest complkcated with signs and symptoms of brain damage ? ?History of Present Illness:  ?88 y.o. male with past medical history of atrial fibrillation, stage IV adenocarcinoma of the lung, s/p cardiac arrest.  PROLONGED DOWNTIME APPROX 45 MINUTES ? ?Patient reportedly was found unresponsive in the car this morning.  He had been unresponsive for several minutes prior to being found.   ?Bystander CPR was begun.   ? ?Per report, fire department administered multiple shocks, 7 epi, 450 amnio, 2 g of magnesium.  Patient had brief return of circulation in route, but lost pulses again with a PEA to asystole prior to arrival.  He arrives with CPR in progress. ?  ? ?Wife reports has been feeling generally unwell for the last 4 days.  He had been complaining of leg pain, that this has been a somewhat chronic issue.   ?He states he felt unwell this morning and became less responsive while his wife was driving him to be evaluated. ? ?PCCM asked to admit, patient with multiorgan failure and severe cardiogenic shock ? ?ER course ?Inundated, placed on EPI infusions at 30 ?PCCM ordered LEVo infusions, and bicarb infusions ? ?pH 6.95, FSBS >300, placed on EPI ? ? ? ?Significant Hospital Events: ?Including procedures, antibiotic start and stop dates in addition to other pertinent events   ?4/17 admission to ICU for acute cardiac arrest ? ? ?Micro Data:  ?COVID/FLU NEG ? ? ? ? ? ? ?Objective   ?Blood pressure (!) 140/105, pulse 100, temperature (!) 96.2 ?F (35.7 ?C), resp. rate (!) 21, weight 99 kg, SpO2 (!) 66 %. ?   ?   ? ?Intake/Output Summary (Last 24 hours) at 03/06/22 1250 ?Last data filed at 2022/03/06 1057 ?Gross per 24 hour  ?Intake 1000 ml  ?Output --  ?Net 1000 ml  ? ?Filed Weights  ?  2022-03-06 1154  ?Weight: 99 kg  ? ? ? ? ?REVIEW OF SYSTEMS ? ?PATIENT IS UNABLE TO PROVIDE COMPLETE REVIEW OF SYSTEMS DUE TO SEVERE CRITICAL ILLNESS AND TOXIC METABOLIC ENCEPHALOPATHY ? ? ?PHYSICAL EXAMINATION: ? ?GENERAL:critically ill appearing, +resp distress ?EYES: Pupils equal, round, reactive to light.  No scleral icterus.  ?MOUTH: Moist mucosal membrane. INTUBATED ?NECK: Supple.  ?PULMONARY: +rhonchi, +wheezing ?CARDIOVASCULAR: S1 and S2.  No murmurs  ?GASTROINTESTINAL: Soft, nontender, +distended. NEGbowel sounds. +mottled ?MUSCULOSKELETAL: +edema.  ?NEUROLOGIC: obtunded ?SKIN:intact,warm,dry ? ? ? ? ?Labs/imaging that I havepersonally reviewed  ?(right click and "Reselect all SmartList Selections" daily)  ? ? ? ?ASSESSMENT AND PLAN ?SYNOPSIS ? ?88 yo white male with acute sudden cardiac arrest and death due to cardiac ischemia and probable acute PE with severe cardiogenic shock and progressive multiorgan failure with signs and symptoms brain damage ? ?Severe ACUTE Hypoxic and Hypercapnic Respiratory Failure ?-continue Mechanical Ventilator support ?-continue Bronchodilator Therapy ?-Wean Fio2 and PEEP as tolerated ?-VAP/VENT bundle implementation ?-will NOT perform SAT/SBT  ? ?  ?CARDIAC FAILURE-acute combined systolic/diastolic dysfunction ?ACUTE NSTEMI ?-oxygen as needed ? ?-follow up cardiac enzymes as indicated ? ? ?CARDIAC ?ICU monitoring ? ? ?ACUTE KIDNEY INJURY/Renal Failure ?-continue Foley Catheter-assess need ?-Avoid nephrotoxic agents ?-Follow urine output, BMP ?-Ensure adequate renal perfusion, optimize oxygenation ?-Renal dose medications ? ? ?Intake/Output Summary (Last 24 hours) at 2022-03-06 1250 ?Last data filed at Mar 06, 2022  1057 ?Gross per 24 hour  ?Intake 1000 ml  ?Output --  ?Net 1000 ml  ? ? ? ?NEUROLOGY ?Acute toxic metabolic encephalopathy, need for sedation ?Goal RASS -2 to -3 ?IV keppra for seizures ?Will need Neurology consultation ? ? ?CARDIOGENIC SHOCK ?-use vasopressors to keep  MAP>65 as needed ?-follow ABG and LA ?-follow up cultures ? ? ?ENDO ?- ICU hypoglycemic\Hyperglycemia protocol ?-check FSBS per protocol ? ? ?GI ?GI PROPHYLAXIS as indicated ? ?NUTRITIONAL STATUS ?DIET-->TF's as tolerated ?Constipation protocol as indicated ? ? ?ELECTROLYTES ?-follow labs as needed ?-replace as needed ?-pharmacy consultation and following ? ? ?ACUTE ANEMIA- ?TRANSFUSE AS NEEDED ?CONSIDER TRANSFUSION  IF HGB<7 ?DVT PRX with TED/SCD's ONLY ? ? ? ? ?Best practice (right click and "Reselect all SmartList Selections" daily)  ?Diet: NPO ?Pain/Anxiety/Delirium protocol (if indicated): Yes (RASS goal 0) ?VAP protocol (if indicated): Yes ?DVT prophylaxis: Subcutaneous Heparin ?GI prophylaxis: PPI ?Foley:  Yes, and it is still needed ?Mobility:  bed rest  ?Code Status:  FULL ?Disposition:ICU ? ?Labs   ?CBC: ?Recent Labs  ?Lab 02/11/2022 ?1012  ?WBC 14.4*  ?NEUTROABS 7.4  ?HGB 12.8*  ?HCT 44.7  ?MCV 87.3  ?PLT 191  ? ? ?Basic Metabolic Panel: ?Recent Labs  ?Lab 11-Feb-2022 ?1012  ?NA 132*  ?K 4.9  ?CL 97*  ?CO2 21*  ?GLUCOSE 300*  ?BUN 18  ?CREATININE 1.45*  ?CALCIUM 8.4*  ?MG 4.0*  ? ?GFR: ?CrCl cannot be calculated (Unknown ideal weight.). ?Recent Labs  ?Lab 2022/02/11 ?1012  ?WBC 14.4*  ? ? ?Liver Function Tests: ?Recent Labs  ?Lab 02/11/2022 ?1012  ?AST 52*  ?ALT 25  ?ALKPHOS 96  ?BILITOT 0.9  ?PROT 6.1*  ?ALBUMIN 2.5*  ? ? ?Coagulation Profile: ?Recent Labs  ?Lab 2022-02-11 ?1012  ?INR 2.0*  ? ? ?Cardiac Enzymes: ?No results for input(s): CKTOTAL, CKMB, CKMBINDEX, TROPONINI in the last 168 hours. ? ?HbA1C: ?No results found for: HGBA1C ? ?CBG: ?Recent Labs  ?Lab Feb 11, 2022 ?1012  ?GLUCAP 96  ? ? ? ?Past Medical History:  ?He,  has a past medical history of CAD (coronary artery disease), CHF (congestive heart failure) ([place]), COPD (chronic obstructive pulmonary disease) ([place]), and Hypertension.  ? ?Surgical History:  ? ?Past Surgical History:  ?Procedure Laterality Date  ? CORONARY ARTERY BYPASS GRAFT    ?  ? ?Social  History:  ? reports that he quit smoking about 34 years ago. His smoking use included cigarettes. He has a 122.50 pack-year smoking history. He quit smokeless tobacco use about 19 years ago.  His smokeless tobacco use included chew. He reports that he does not drink alcohol and does not use drugs.  ? ?Family History:  ?His family history includes Asthma in his mother; Heart disease in his father.  ? ?Allergies ?Allergies  ?Allergen Reactions  ? Ambien [Zolpidem Tartrate]   ? Ciprofloxacin   ? Ciprofloxacin Hcl Rash  ?  Per Dr. Sherol Dade ?Per Dr. Sherol Dade ?  ?  ? ?Home Medications  ?Prior to Admission medications   ?Medication Sig Start Date End Date Taking? Authorizing Provider  ?acetaminophen (TYLENOL) 325 MG tablet Take 2 tablets (650 mg total) by mouth every 6 (six) hours as needed for mild pain (or Fever >/= 101). 05/20/18   Gouru, Aruna, MD  ?aspirin EC 81 MG tablet Take 81 mg by mouth daily.    [provider]  ?cholecalciferol (VITAMIN D) 1000 units tablet Take 1,000 Units by mouth daily.    [provider]  ?cyanocobalamin 500 MCG  tablet Take 500 mcg by mouth daily.    [provider]  ?docusate sodium (COLACE) 100 MG capsule Take 1 capsule (100 mg total) by mouth daily. 05/20/18   Gouru, Illene Silver, MD  ?ELIQUIS 5 MG TABS tablet Take 1 tablet (5 mg total) by mouth 2 (two) times daily. Patient advised to discuss with primary care physician before starting Eliquis.  It has been on hold for 4 weeks ?Patient taking differently: Take 5 mg by mouth 2 (two) times daily.  08/20/17   Fritzi Mandes, MD  ?Fluticasone-Umeclidin-Vilant (TRELEGY ELLIPTA IN) Inhale 1 puff into the lungs daily.    [provider]  ?furosemide (LASIX) 40 MG tablet Take 40 mg by mouth daily.    [provider]  ?glimepiride (AMARYL) 2 MG tablet Take 1 tablet by mouth daily. 08/12/17   [provider]  ?HYDROcodone-acetaminophen (NORCO/VICODIN) 5-325 MG tablet Take 1-2 tablets by mouth every 6 (six)  hours as needed for moderate pain or severe pain. 05/20/18   Gouru, Illene Silver, MD  ?ipratropium-albuterol (DUONEB) 0.5-2.5 (3) MG/3ML SOLN Take 3 mLs by nebulization every 6 (six) hours as needed. 08/20/17   Fritzi Mandes

## 2022-02-23 NOTE — Progress Notes (Signed)
PHARMACY CONSULT NOTE  ? ?Pharmacy Consult for Electrolyte Monitoring and Replacement  ? ?Recent Labs: ?Potassium (mmol/L)  ?Date Value  ?12-Feb-2022 4.9  ?02/27/2014 4.1  ? ?Magnesium (mg/dL)  ?Date Value  ?February 12, 2022 4.0 (H)  ? ?Calcium (mg/dL)  ?Date Value  ?02-12-22 8.4 (L)  ? ?Calcium, Total (mg/dL)  ?Date Value  ?02/27/2014 8.9  ? ?Albumin (g/dL)  ?Date Value  ?02/12/2022 2.5 (L)  ?02/27/2014 4.0  ? ?Sodium (mmol/L)  ?Date Value  ?02/12/22 132 (L)  ?02/27/2014 139  ? ?Corrected Ca: 9.6 mg/dL ? ?Assessment: 88 y.o. male w/ PMH of atrial fibrillation, stage IV adenocarcinoma of the lung, here with cardiac arrest ? ?Goal of Therapy:  ?Potassium 4.0 - 5.1 mmol/L ?Magnesium 2.0 - 2.4 mg/dL ?All Other Electrolytes WNL ? ?Plan:  ?No electrolytes replacement required today ?Recheck electrolytes in am ? ?Dallie Piles ,PharmD ?Clinical Pharmacist ?12-Feb-2022 1:08 PM ? ?

## 2022-02-23 NOTE — Code Documentation (Signed)
Epi gtt increased to 20 mcg/min per Dr. Ellender Hose.  ?

## 2022-02-23 NOTE — ED Notes (Signed)
Per Dr. Mortimer Fries, continue gtt at current rates, do not titrate.  ?

## 2022-02-23 NOTE — ED Notes (Signed)
Pt becoming more agitated, biting tube, moving arms. Dr. Ellender Hose to bedside. Per verbal order from Dr. Ellender Hose, give bolus 50 mcg Fentanyl and increase continuous rate to 100 mcg/hr.  ?

## 2022-02-23 NOTE — Plan of Care (Signed)
? ?  Interdisciplinary Goals of Care Family Meeting ? ? ?Date carried out: 02-24-22 ? ?Location of the meeting: Bedside ? ?Member's involved: Physician, Bedside Registered Nurse, and Family Member or next of kin ? ? ?Code status: Full DNR ? ?Disposition: Continue current acute care ? ? ? ? ?GOALS OF CARE DISCUSSION ? ?The Clinical status was relayed to family in detail- ? ?Updated and notified of patients medical condition- ?Patient remains unresponsive and will not open eyes to command.   ?Patient is having a weak cough and struggling to remove secretions.   ?Patient with increased WOB and using accessory muscles to breathe ?Explained to family course of therapy and the modalities  ? ?Patient with Progressive multiorgan failure with a very high probablity of a very minimal chance of meaningful recovery despite all aggressive and optimal medical therapy.  ? ?Severe cardiogenic shock and signs of brain damage ?ER doc at bedside ? ?Family understands the situation. ? ?They have consented and agreed to DNR after ER doc started CPR a third time ? ?Family are satisfied with Plan of action and management. All questions answered ? ?Additional CC time 35 mins ? ? ?Corrin Parker, M.D.  ?[facility]  ?Medical Director [place] ?Medical Director [facility] Cardio-Pulmonary Department  ? ? ?

## 2022-02-23 NOTE — Progress Notes (Signed)
?   2022-02-27 1400  ?Clinical Encounter Type  ?Visited With Patient and family together  ?Visit Type Initial  ?Referral From Nurse  ?Consult/Referral To Chaplain  ? ?Chaplain responded to nurse page. Chaplain me with family at bedside of patient in EOL. Chaplain provided compassionate presence and reflective listening. Family shared sweet memories of patient during EOL. Patient passed. Chaplain provided support for family after death of loved one. Family appreciated Chaplain presence. ?

## 2022-02-23 NOTE — Code Documentation (Signed)
Epi gtt increased to 30 mcg/min per Dr. Ellender Hose.  ?

## 2022-02-23 NOTE — ED Notes (Signed)
Versed gtt started to RAC at 2 mg/hr and 2 mg bolus given per verbal order from Darlyn Chamber, ICU NP. ?

## 2022-02-23 NOTE — ED Notes (Signed)
Family to come and pick up pt's belongings. Belongings retrieved from pt body bag and handed over to daughter of pt. ?

## 2022-02-23 NOTE — ED Notes (Signed)
No pulse felt. Jerry Jimenez, ICU NP at bedside to confirm no cardiac activity.  ?

## 2022-02-23 NOTE — ED Notes (Signed)
Pt's wallet, car keys, nitro meds, coins and small gray tool placed in belongings bag with pt. Pt sealed in body bag ?

## 2022-02-23 NOTE — ED Notes (Signed)
Wasted 90 mg Versed and 2400 mcg Fentanyl in Pyxis with Raquel, RN. ?

## 2022-02-23 NOTE — ED Triage Notes (Signed)
Pt BIB [facility]. Pt reportedly found in his truck in a parking lot by bystander. On FD arrival, no pulses so CPR initiated. Per EMS, pt given total of 7 Epi, 450 mg amio, 2 g mag. Pt shocked multiple time en route for Vtach rhythm. Pt achieved ROSC en route but lost pulses again prior to arrival. Pt arrives with CPR in progress. EDP, RN X 4, RT, NT at bedside on arrival. See Code narrator.  ?

## 2022-02-23 NOTE — ED Notes (Signed)
TOD 1335. ?

## 2022-02-23 NOTE — ED Provider Notes (Signed)
? ?[facility] ?Provider Note ? ? ? Event Date/Time  ? First MD Initiated Contact with Patient 13-Feb-2022 1025   ?  (approximate) ? ? ?History  ? ?Cardiac Arrest ? ? ?HPI ? ?Jerry Jimenez is a 88 y.o. male with past medical history of atrial fibrillation, stage IV adenocarcinoma of the lung, here with cardiac arrest.  Patient reportedly was found unresponsive in the car this morning.  He had been unresponsive for several minutes prior to being found.  Bystander CPR was begun.  Per report, fire department administered multiple shocks, 7 epi, 450 amnio, 2 g of magnesium.  Patient had brief return of circulation in route, but lost pulses again with a PEA to asystole prior to arrival.  He arrives with CPR in progress. ? ?On further history with the wife, patient reportedly has been feeling generally unwell for the last 4 days.  He had been complaining of leg pain, that this has been a somewhat chronic issue.  He states he felt unwell this morning and became less responsive while his wife was driving him to be evaluated. ?  ? ? ?Physical Exam  ? ?Triage Vital Signs: ?ED Triage Vitals  ?Enc Vitals Group  ?   BP 02-13-2022 1024 (!) 132/58  ?   Pulse Rate 02/13/2022 1024 (!) 101  ?   Resp 2022/02/13 1024 (!) 23  ?   Temp Feb 13, 2022 1052 (!) 96.7 ?F (35.9 ?C)  ?   Temp src --   ?   SpO2 02/13/22 1024 (!) 63 %  ?   Weight 02/13/2022 1154 218 lb 4.8 oz (99 kg)  ?   Height --   ?   Head Circumference --   ?   Peak Flow --   ?   Pain Score --   ?   Pain Loc --   ?   Pain Edu? --   ?   Excl. in [place]? --   ? ? ?Most recent vital signs: ?Vitals:  ? 02/13/22 1324 02/13/22 1326  ?BP: (!) 71/36 (!) 47/34  ?Pulse:    ?Resp: (!) 21 (!) 21  ?Temp: (!) 96.2 ?F (35.7 ?C) (!) 96.2 ?F (35.7 ?C)  ?SpO2:    ? ? ? ?General: Unresponsive, CPR in progress ?CV:  Cyanotic, CPR in progress, no audible heart sounds ?Resp:  Bag-valve-mask ventilation via Edison Pace airway ?Abd:  No distention.  ?Other:  Trace bilateral edema.  Peripheral  cyanosis. ? ? ?ED Results / Procedures / Treatments  ? ?Labs ?(all labs ordered are listed, but only abnormal results are displayed) ?Labs Reviewed  ?BLOOD GAS, VENOUS - Abnormal; Notable for the following components:  ?    Result Value  ? pH, Ven <6.95 (*)   ? pCO2, Ven 97 (*)   ? Bicarbonate 19.4 (*)   ? Acid-base deficit 15.1 (*)   ? All other components within normal limits  ?CBC WITH DIFFERENTIAL/PLATELET - Abnormal; Notable for the following components:  ? WBC 14.4 (*)   ? Hemoglobin 12.8 (*)   ? MCH 25.0 (*)   ? MCHC 28.6 (*)   ? RDW 21.8 (*)   ? nRBC 1.3 (*)   ? Monocytes Absolute 1.1 (*)   ? Abs Immature Granulocytes 1.75 (*)   ? All other components within normal limits  ?COMPREHENSIVE METABOLIC PANEL - Abnormal; Notable for the following components:  ? Sodium 132 (*)   ? Chloride 97 (*)   ? CO2 21 (*)   ?  Glucose, Bld 300 (*)   ? Creatinine, Ser 1.45 (*)   ? Calcium 8.4 (*)   ? Total Protein 6.1 (*)   ? Albumin 2.5 (*)   ? AST 52 (*)   ? GFR, Estimated 47 (*)   ? All other components within normal limits  ?MAGNESIUM - Abnormal; Notable for the following components:  ? Magnesium 4.0 (*)   ? All other components within normal limits  ?PROTIME-INR - Abnormal; Notable for the following components:  ? Prothrombin Time 22.6 (*)   ? INR 2.0 (*)   ? All other components within normal limits  ?D-DIMER, QUANTITATIVE - Abnormal; Notable for the following components:  ? D-Dimer, Quant 16.68 (*)   ? All other components within normal limits  ?TROPONIN I (HIGH SENSITIVITY) - Abnormal; Notable for the following components:  ? Troponin I (High Sensitivity) 295 (*)   ? All other components within normal limits  ?RESP PANEL BY RT-PCR (FLU A&B, COVID) [id number]  ?PROCALCITONIN  ?BLOOD GAS, VENOUS  ?LACTIC ACID, PLASMA  ?LACTIC ACID, PLASMA  ?LACTIC ACID, PLASMA  ?LACTIC ACID, PLASMA  ?CBG MONITORING, ED  ?TROPONIN I (HIGH SENSITIVITY)  ? ? ? ?EKG ?Sinus or ectopic atrial rhythm, ventricular rate 76.  PR 251, QRS 120, QTc 479.   Diffuse ischemic changes, no ST elevations. ? ? ?RADIOLOGY ?Chest x-ray: ET tube in place, diffuse, severe bilateral interstitial opacities ? ? ?I also independently reviewed and agree wit radiologist interpretations. ? ? ?PROCEDURES: ? ?Critical Care performed: Yes, see critical care procedure note(s) ? ?.Critical Care ?Performed by: Duffy Bruce, MD ?Authorized by: Duffy Bruce, MD  ? ?Critical care provider statement:  ?  Critical care time (minutes):  75 ?  Critical care time was exclusive of:  Separately billable procedures and treating other patients ?  Critical care was necessary to treat or prevent imminent or life-threatening deterioration of the following conditions:  Cardiac failure, circulatory failure and respiratory failure ?  Critical care was time spent personally by me on the following activities:  Development of treatment plan with patient or surrogate, discussions with consultants, evaluation of patient's response to treatment, examination of patient, ordering and review of laboratory studies, ordering and review of radiographic studies, ordering and performing treatments and interventions, pulse oximetry, re-evaluation of patient's condition and review of old charts ?Procedure Name: Intubation ?Date/Time: 03-06-2022 12:32 PM ?Performed by: Duffy Bruce, MD ?Pre-anesthesia Checklist: Patient identified ?Oxygen Delivery Method: Simple face mask ?Preoxygenation: Pre-oxygenation with 100% oxygen ?Laryngoscope Size: Glidescope and 4 ?Grade View: Grade I ?Tube size: 8.0 mm ?Number of attempts: 1 ?Airway Equipment and Method: Rigid stylet ?Placement Confirmation: ETT inserted through vocal cords under direct vision ?Secured at: 23 cm ?Tube secured with: Tape ?Dental Injury: Teeth and Oropharynx as per pre-operative assessment  ?Future Recommendations: Recommend- induction with short-acting agent, and alternative techniques readily available ? ? ? ? ? ? ?MEDICATIONS ORDERED IN ED: ?Medications   ?calcium chloride 10 % injection (has no administration in time range)  ?EPINEPHrine (ADRENALIN) 1 MG/10ML injection (has no administration in time range)  ?sodium chloride 0.9 % bolus 1,000 mL (1,000 mLs Intravenous Not Given 03-06-2022 1058)  ?fentaNYL 2582mcg in NS 292mL (95mcg/ml) infusion-PREMIX (100 mcg/hr Intravenous Rate/Dose Change 03-06-2022 1213)  ?fentaNYL (SUBLIMAZE) bolus via infusion 25-100 mcg (50 mcg Intravenous Bolus from Bag 03-06-2022 1212)  ?0.9 %  sodium chloride infusion (has no administration in time range)  ?norepinephrine (LEVOPHED) 4mg  in 261mL (0.016 mg/mL) premix infusion (has no administration in time range)  ?  norepinephrine (LEVOPHED) 4mg  in 227mL (0.016 mg/mL) premix infusion (20 mcg/min Intravenous New Bag/Given 02/28/2022 1258)  ?midazolam-sodium chloride 100-0.9 MG/100ML-% infusion (has no administration in time range)  ?docusate (COLACE) 50 MG/5ML liquid 100 mg (has no administration in time range)  ?polyethylene glycol (MIRALAX / GLYCOLAX) packet 17 g (has no administration in time range)  ?midazolam (VERSED) 100 mg/100 mL (1 mg/mL) premix infusion (has no administration in time range)  ?midazolam (VERSED) bolus via infusion 0-5 mg (has no administration in time range)  ?pantoprazole (PROTONIX) injection 40 mg (has no administration in time range)  ?docusate sodium (COLACE) capsule 100 mg (has no administration in time range)  ?polyethylene glycol (MIRALAX / GLYCOLAX) packet 17 g (has no administration in time range)  ?sodium bicarbonate 150 mEq in sterile water 1,150 mL infusion (has no administration in time range)  ?EPINEPHrine (ADRENALIN) 1 MG/10ML injection (1 mg Intravenous Given 2022/02/28 1307)  ?0.9 %  sodium chloride infusion (1,000 mLs Intravenous New Bag/Given 28-Feb-2022 1057)  ?calcium chloride injection (1 g Intravenous Given 02/28/22 1016)  ?sodium bicarbonate injection (50 mEq Intravenous Given 28-Feb-2022 1305)  ?EPINEPHrine (ADRENALIN) 1 MG/10ML injection (1 mg Intravenous Given  2022/02/28 1041)  ?fentaNYL (SUBLIMAZE) injection 25 mcg (25 mcg Intravenous Given 02-28-22 1204)  ?furosemide (LASIX) injection 40 mg (40 mg Intravenous Given Feb 28, 2022 1225)  ? ? ? ?IMPRESSION / MDM / ASSESSMENT AND PL

## 2022-02-23 NOTE — Death Summary Note (Signed)
?DEATH SUMMARY  ? ?Patient Details  ?Name: Jerry Jimenez ?MRN: [id number] ?DOB: 1934/04/15 ? ?Admission/Discharge Information  ? ?Admit Date:  02/19/2022  ?Date of Death:  02/19/2022 ?  ?Time of Death:  6269  ?Length of Stay: 0  ?Referring Physician: Rusty Aus, MD  ? ?Reason(s) for Hospitalization  ?Acute cardiac arrest  ? ?Diagnoses  ?Preliminary cause of death: ISHCEMIC CARDIOMYOPATHY, LUNG CANCER ?Secondary Diagnoses (including complications and co-morbidities):  ?Principal Problem: ?  Cardiac arrest ([place]) ?Active Problems: ?  Cardiogenic shock ([place]) ?  Brain damage ?  Metabolic acidosis ? ? ?Brief Hospital Course (including significant findings, care, treatment, and services provided and events leading to death)  ? ?88 y.o. male with past medical history of atrial fibrillation, stage IV adenocarcinoma of the lung, s/p cardiac arrest.  PROLONGED DOWNTIME APPROX 45 MINUTES ?  ?Patient reportedly was found unresponsive in the car this morning.  He had been unresponsive for several minutes prior to being found.   ?Bystander CPR was begun.   ?  ?Per report, fire department administered multiple shocks, 7 epi, 450 amnio, 2 g of magnesium.  Patient had brief return of circulation in route, but lost pulses again with a PEA to asystole prior to arrival.  He arrives with CPR in progress. ?  ?  ?Wife reports has been feeling generally unwell for the last 4 days.  He had been complaining of leg pain, that this has been a somewhat chronic issue.   ?He states he felt unwell this morning and became less responsive while his wife was driving him to be evaluated. ?  ?PCCM asked to admit, patient with multiorgan failure and severe cardiogenic shock ?  ?ER course ?Inundated, placed on EPI infusions at 30 ?PCCM ordered LEVo infusions, and bicarb infusions ?  ?pH 6.95, FSBS >300, placed on EPI ? ?  ?Interdisciplinary Goals of Care Family Meeting ?  ?  ?Date carried out: 02-19-2022 ?  ?Location of the meeting: Bedside ?  ?Member's  involved: Physician, Bedside Registered Nurse, and Family Member or next of kin ?  ?  ?Code status: Full DNR ?  ?Disposition: Continue current acute care ?  ?  ?  ?  ?GOALS OF CARE DISCUSSION ?  ?The Clinical status was relayed to family in detail- ?  ?Updated and notified of patients medical condition- ?Patient remains unresponsive and will not open eyes to command.   ?Patient is having a weak cough and struggling to remove secretions.   ?Patient with increased WOB and using accessory muscles to breathe ?Explained to family course of therapy and the modalities  ?  ?Patient with Progressive multiorgan failure with a very high probablity of a very minimal chance of meaningful recovery despite all aggressive and optimal medical therapy.  ?  ?Severe cardiogenic shock and signs of brain damage ?ER doc at bedside ?  ?Family understands the situation. ?  ?They have consented and agreed to DNR after ER doc started CPR a third time ?  ?Family are satisfied with Plan of action and management. All questions answered ?  ? ?PATIENT SUBSEQUENTLY DIED In ER ? ? ?Pertinent Labs and Studies  ?Significant Diagnostic Studies ?DG Chest Portable 1 View ? ?Result Date: Feb 19, 2022 ?CLINICAL DATA:  Status post cardiac arrest. EXAM: PORTABLE CHEST 1 VIEW COMPARISON:  CT chest 05/16/2018 FINDINGS: The endotracheal tube tip is above the carina. There is a enteric tube with tip below the GE junction in the expected location of the gastric fundus. The  side port is at the level of the hemidiaphragms. There is diffuse interstitial and airspace opacities throughout both lungs likely reflecting alveolar and interstitial edema secondary to ARDS. IMPRESSION: 1. Satisfactory position of endotracheal tube with tip above the carina. 2. Diffuse bilateral interstitial and airspace opacities compatible with alveolar and interstitial edema. Electronically Signed   By: Kerby Moors M.D.   On: 20-Feb-2022 11:55   ? ?Microbiology ?Recent Results (from the  past 240 hour(s))  ?Resp Panel by RT-PCR (Flu A&B, Covid) Nasopharyngeal Swab     Status: None  ? Collection Time: February 20, 2022 10:12 AM  ? Specimen: Nasopharyngeal Swab; Nasopharyngeal(NP) swabs in vial transport medium  ?Result Value Ref Range Status  ? SARS Coronavirus 2 by RT PCR NEGATIVE NEGATIVE Final  ?  Comment: (NOTE) ?SARS-CoV-2 target nucleic acids are NOT DETECTED. ? ?The SARS-CoV-2 RNA is generally detectable in upper respiratory ?specimens during the acute phase of infection. The lowest ?concentration of SARS-CoV-2 viral copies this assay can detect is ?138 copies/mL. A negative result does not preclude SARS-Cov-2 ?infection and should not be used as the sole basis for treatment or ?other patient management decisions. A negative result may occur with  ?improper specimen collection/handling, submission of specimen other ?than nasopharyngeal swab, presence of viral mutation(s) within the ?areas targeted by this assay, and inadequate number of viral ?copies(<138 copies/mL). A negative result must be combined with ?clinical observations, patient history, and epidemiological ?information. The expected result is Negative. ? ?Fact Sheet for Patients:  ?EntrepreneurPulse.com.au ? ?Fact Sheet for Healthcare Providers:  ?IncredibleEmployment.be ? ?This test is no t yet approved or cleared by the [place] FDA and  ?has been authorized for detection and/or diagnosis of SARS-CoV-2 by ?FDA under an Emergency Use Authorization (EUA). This EUA will remain  ?in effect (meaning this test can be used) for the duration of the ?COVID-19 declaration under Section 564(b)(1) of the Act, 21 ?U.S.C.section 360bbb-3(b)(1), unless the authorization is terminated  ?or revoked sooner.  ? ? ?  ? Influenza A by PCR NEGATIVE NEGATIVE Final  ? Influenza B by PCR NEGATIVE NEGATIVE Final  ?  Comment: (NOTE) ?The Xpert Xpress SARS-CoV-2/FLU/RSV plus assay is intended as an aid ?in the diagnosis of  influenza from Nasopharyngeal swab specimens and ?should not be used as a sole basis for treatment. Nasal washings and ?aspirates are unacceptable for Xpert Xpress SARS-CoV-2/FLU/RSV ?testing. ? ?Fact Sheet for Patients: ?EntrepreneurPulse.com.au ? ?Fact Sheet for Healthcare Providers: ?IncredibleEmployment.be ? ?This test is not yet approved or cleared by the [place] FDA and ?has been authorized for detection and/or diagnosis of SARS-CoV-2 by ?FDA under an Emergency Use Authorization (EUA). This EUA will remain ?in effect (meaning this test can be used) for the duration of the ?COVID-19 declaration under Section 564(b)(1) of the Act, 21 U.S.C. ?section 360bbb-3(b)(1), unless the authorization is terminated or ?revoked. ? ?Performed at [facility], [place], ?[place] [address] ?  ? ? ?Lab ?Basic Metabolic Panel: ?Recent Labs  ?Lab 2022-02-20 ?1012  ?NA 132*  ?K 4.9  ?CL 97*  ?CO2 21*  ?GLUCOSE 300*  ?BUN 18  ?CREATININE 1.45*  ?CALCIUM 8.4*  ?MG 4.0*  ? ?Liver Function Tests: ?Recent Labs  ?Lab 2022/02/20 ?1012  ?AST 52*  ?ALT 25  ?ALKPHOS 96  ?BILITOT 0.9  ?PROT 6.1*  ?ALBUMIN 2.5*  ? ?No results for input(s): LIPASE, AMYLASE in the last 168 hours. ?No results for input(s): AMMONIA in the last 168 hours. ?CBC: ?Recent Labs  ?Lab  2022/02/10 ?1012  ?WBC 14.4*  ?NEUTROABS 7.4  ?HGB 12.8*  ?HCT 44.7  ?MCV 87.3  ?PLT 191  ? ?Cardiac Enzymes: ?No results for input(s): CKTOTAL, CKMB, CKMBINDEX, TROPONINI in the last 168 hours. ?Sepsis Labs: ?Recent Labs  ?Lab 2022/02/10 ?1012  ?WBC 14.4*  ? ? ? ? ?Maretta Bees Meeya Goldin ?February 10, 2022, 1:41 PM ? ? ?

## 2022-02-23 NOTE — ED Notes (Signed)
Per Dr. Mortimer Fries with ICU, leave pt on Epi gtt for now until Versed gtt is initiated. ?

## 2022-02-23 NOTE — ED Notes (Signed)
Per Dr. Ellender Hose, switch from Epi gtt to Levo gtt and will give a dose a IV Lasix. SaO2 remains 65-70%, with highest FiO2 and PEEP. ?

## 2022-02-23 DEATH — deceased
